# Patient Record
Sex: Male | Born: 1940 | Race: White | Hispanic: No | Smoking: Former smoker
Health system: Southern US, Community
[De-identification: ages and names within clinical notes are randomized; demographics above are authoritative.]

## PROBLEM LIST (undated history)

## (undated) DIAGNOSIS — M109 Gout, unspecified: Secondary | ICD-10-CM

## (undated) DIAGNOSIS — I10 Essential (primary) hypertension: Secondary | ICD-10-CM

## (undated) DIAGNOSIS — E119 Type 2 diabetes mellitus without complications: Secondary | ICD-10-CM

---

## 2014-06-10 DIAGNOSIS — M109 Gout, unspecified: Secondary | ICD-10-CM | POA: Diagnosis not present

## 2014-06-10 DIAGNOSIS — Z1389 Encounter for screening for other disorder: Secondary | ICD-10-CM | POA: Diagnosis not present

## 2014-06-10 DIAGNOSIS — I10 Essential (primary) hypertension: Secondary | ICD-10-CM | POA: Diagnosis not present

## 2014-07-09 DIAGNOSIS — E119 Type 2 diabetes mellitus without complications: Secondary | ICD-10-CM | POA: Diagnosis not present

## 2014-07-09 DIAGNOSIS — I10 Essential (primary) hypertension: Secondary | ICD-10-CM | POA: Diagnosis not present

## 2014-07-09 DIAGNOSIS — M109 Gout, unspecified: Secondary | ICD-10-CM | POA: Diagnosis not present

## 2014-09-26 DIAGNOSIS — M109 Gout, unspecified: Secondary | ICD-10-CM | POA: Diagnosis not present

## 2014-09-26 DIAGNOSIS — E119 Type 2 diabetes mellitus without complications: Secondary | ICD-10-CM | POA: Diagnosis not present

## 2014-09-26 DIAGNOSIS — I10 Essential (primary) hypertension: Secondary | ICD-10-CM | POA: Diagnosis not present

## 2014-10-10 DIAGNOSIS — E119 Type 2 diabetes mellitus without complications: Secondary | ICD-10-CM | POA: Diagnosis not present

## 2014-10-10 DIAGNOSIS — I10 Essential (primary) hypertension: Secondary | ICD-10-CM | POA: Diagnosis not present

## 2014-10-10 DIAGNOSIS — M109 Gout, unspecified: Secondary | ICD-10-CM | POA: Diagnosis not present

## 2015-02-03 DIAGNOSIS — M109 Gout, unspecified: Secondary | ICD-10-CM | POA: Diagnosis not present

## 2015-02-03 DIAGNOSIS — I10 Essential (primary) hypertension: Secondary | ICD-10-CM | POA: Diagnosis not present

## 2015-02-03 DIAGNOSIS — E119 Type 2 diabetes mellitus without complications: Secondary | ICD-10-CM | POA: Diagnosis not present

## 2015-02-03 DIAGNOSIS — H579 Unspecified disorder of eye and adnexa: Secondary | ICD-10-CM

## 2015-02-03 DIAGNOSIS — K029 Dental caries, unspecified: Secondary | ICD-10-CM

## 2015-02-03 NOTE — Congregational Nurse Program (Signed)
Client referred to local [facility] for vision referral. Will place on list for next Dental Bus at the [facility] in [place].

## 2015-02-07 DIAGNOSIS — Z23 Encounter for immunization: Secondary | ICD-10-CM | POA: Diagnosis not present

## 2015-02-07 DIAGNOSIS — M109 Gout, unspecified: Secondary | ICD-10-CM | POA: Diagnosis not present

## 2015-02-07 DIAGNOSIS — E119 Type 2 diabetes mellitus without complications: Secondary | ICD-10-CM | POA: Diagnosis not present

## 2015-02-07 DIAGNOSIS — I10 Essential (primary) hypertension: Secondary | ICD-10-CM | POA: Diagnosis not present

## 2015-05-30 DIAGNOSIS — I10 Essential (primary) hypertension: Secondary | ICD-10-CM | POA: Diagnosis not present

## 2015-05-30 DIAGNOSIS — M109 Gout, unspecified: Secondary | ICD-10-CM | POA: Diagnosis not present

## 2015-05-30 DIAGNOSIS — E119 Type 2 diabetes mellitus without complications: Secondary | ICD-10-CM | POA: Diagnosis not present

## 2015-06-06 DIAGNOSIS — I10 Essential (primary) hypertension: Secondary | ICD-10-CM | POA: Diagnosis not present

## 2015-06-06 DIAGNOSIS — M109 Gout, unspecified: Secondary | ICD-10-CM | POA: Diagnosis not present

## 2015-06-06 DIAGNOSIS — E119 Type 2 diabetes mellitus without complications: Secondary | ICD-10-CM | POA: Diagnosis not present

## 2015-10-14 DIAGNOSIS — E119 Type 2 diabetes mellitus without complications: Secondary | ICD-10-CM | POA: Diagnosis not present

## 2015-10-14 DIAGNOSIS — I10 Essential (primary) hypertension: Secondary | ICD-10-CM | POA: Diagnosis not present

## 2015-10-16 DIAGNOSIS — Z6826 Body mass index (BMI) 26.0-26.9, adult: Secondary | ICD-10-CM | POA: Diagnosis not present

## 2015-10-16 DIAGNOSIS — I10 Essential (primary) hypertension: Secondary | ICD-10-CM | POA: Diagnosis not present

## 2015-10-16 DIAGNOSIS — R944 Abnormal results of kidney function studies: Secondary | ICD-10-CM | POA: Diagnosis not present

## 2015-10-16 DIAGNOSIS — E119 Type 2 diabetes mellitus without complications: Secondary | ICD-10-CM | POA: Diagnosis not present

## 2015-10-16 DIAGNOSIS — M109 Gout, unspecified: Secondary | ICD-10-CM | POA: Diagnosis not present

## 2016-03-05 DIAGNOSIS — M109 Gout, unspecified: Secondary | ICD-10-CM | POA: Diagnosis not present

## 2016-03-05 DIAGNOSIS — R944 Abnormal results of kidney function studies: Secondary | ICD-10-CM | POA: Diagnosis not present

## 2016-03-05 DIAGNOSIS — I10 Essential (primary) hypertension: Secondary | ICD-10-CM | POA: Diagnosis not present

## 2016-03-05 DIAGNOSIS — E119 Type 2 diabetes mellitus without complications: Secondary | ICD-10-CM | POA: Diagnosis not present

## 2016-03-09 DIAGNOSIS — M109 Gout, unspecified: Secondary | ICD-10-CM | POA: Diagnosis not present

## 2016-03-09 DIAGNOSIS — R944 Abnormal results of kidney function studies: Secondary | ICD-10-CM | POA: Diagnosis not present

## 2016-03-09 DIAGNOSIS — E119 Type 2 diabetes mellitus without complications: Secondary | ICD-10-CM | POA: Diagnosis not present

## 2016-03-09 DIAGNOSIS — I10 Essential (primary) hypertension: Secondary | ICD-10-CM | POA: Diagnosis not present

## 2016-03-09 DIAGNOSIS — Z6827 Body mass index (BMI) 27.0-27.9, adult: Secondary | ICD-10-CM | POA: Diagnosis not present

## 2016-07-06 DIAGNOSIS — R944 Abnormal results of kidney function studies: Secondary | ICD-10-CM | POA: Diagnosis not present

## 2016-07-06 DIAGNOSIS — I10 Essential (primary) hypertension: Secondary | ICD-10-CM | POA: Diagnosis not present

## 2016-07-06 DIAGNOSIS — M109 Gout, unspecified: Secondary | ICD-10-CM | POA: Diagnosis not present

## 2016-07-06 DIAGNOSIS — E119 Type 2 diabetes mellitus without complications: Secondary | ICD-10-CM | POA: Diagnosis not present

## 2016-07-09 DIAGNOSIS — R944 Abnormal results of kidney function studies: Secondary | ICD-10-CM | POA: Diagnosis not present

## 2016-07-09 DIAGNOSIS — Z6827 Body mass index (BMI) 27.0-27.9, adult: Secondary | ICD-10-CM | POA: Diagnosis not present

## 2016-07-09 DIAGNOSIS — I10 Essential (primary) hypertension: Secondary | ICD-10-CM | POA: Diagnosis not present

## 2016-07-09 DIAGNOSIS — M109 Gout, unspecified: Secondary | ICD-10-CM | POA: Diagnosis not present

## 2016-07-09 DIAGNOSIS — E119 Type 2 diabetes mellitus without complications: Secondary | ICD-10-CM | POA: Diagnosis not present

## 2016-07-27 DIAGNOSIS — D649 Anemia, unspecified: Secondary | ICD-10-CM | POA: Diagnosis not present

## 2016-07-27 DIAGNOSIS — C3491 Malignant neoplasm of unspecified part of right bronchus or lung: Secondary | ICD-10-CM | POA: Diagnosis not present

## 2016-07-27 DIAGNOSIS — J189 Pneumonia, unspecified organism: Secondary | ICD-10-CM | POA: Diagnosis not present

## 2016-07-27 DIAGNOSIS — M109 Gout, unspecified: Secondary | ICD-10-CM | POA: Diagnosis not present

## 2016-07-27 DIAGNOSIS — R042 Hemoptysis: Secondary | ICD-10-CM | POA: Diagnosis not present

## 2016-07-27 DIAGNOSIS — R079 Chest pain, unspecified: Secondary | ICD-10-CM | POA: Diagnosis not present

## 2016-07-27 DIAGNOSIS — Z79899 Other long term (current) drug therapy: Secondary | ICD-10-CM | POA: Diagnosis not present

## 2016-07-27 DIAGNOSIS — R918 Other nonspecific abnormal finding of lung field: Secondary | ICD-10-CM | POA: Diagnosis not present

## 2016-07-27 DIAGNOSIS — Z87891 Personal history of nicotine dependence: Secondary | ICD-10-CM | POA: Diagnosis not present

## 2016-07-27 DIAGNOSIS — J181 Lobar pneumonia, unspecified organism: Secondary | ICD-10-CM | POA: Diagnosis not present

## 2016-07-27 DIAGNOSIS — I10 Essential (primary) hypertension: Secondary | ICD-10-CM | POA: Diagnosis not present

## 2016-07-30 ENCOUNTER — Other Ambulatory Visit: Payer: Self-pay

## 2016-07-30 NOTE — Patient Outreach (Signed)
[facility]) Care Management  07/30/2016  Nabor Thomann Sep 18, 1940 [id number]     Transition of Care Referral  Referral Date: 07/30/16 Referral Source: [employer] Discharge Report Date of Discharge: 07/29/16 Facility: [place]: [place] attempt # 1 to patient. No answer and unable to leave voicemail message.    Plan: RN CM will make outreach attempt to patient within one business day.  Enzo Montgomery, RN,BSN,CCM [place] Management Telephonic Care Management Coordinator Direct Phone: [phone] Toll Free: [phone] Fax: [phone]

## 2016-08-02 ENCOUNTER — Other Ambulatory Visit: Payer: Self-pay

## 2016-08-02 NOTE — Patient Outreach (Signed)
[facility]) Care Management  08/02/2016  Terry White May 30, 1940 [id number]   Transition of Care Referral  Referral Date: 07/30/16 Referral Source: [employer] Discharge Report Date of Discharge: 07/29/16 Facility: [place] attempt #2 to patient. Recording stating "subscriber unavailable at this time." No alternate numbers to attempt at this time.     Plan: RN CM will make outreach attempt to patient within one business day.   Enzo Montgomery, RN,BSN,CCM [place] Management Telephonic Care Management Coordinator Direct Phone: [phone] Toll Free: [phone] Fax: [phone]

## 2016-08-03 ENCOUNTER — Other Ambulatory Visit: Payer: Self-pay

## 2016-08-03 NOTE — Patient Outreach (Signed)
[facility]) Care Management  08/03/2016  Loukas Antonson 06-25-1940 [id number]   Transition of Care Referral  Referral Date: 07/30/16 Referral Source: [employer] Discharge Report Date of Discharge: 07/29/16 Facility: [place] attempt #3 to patient and no answer. No alternate numbers to attempt.     Plan: RN CM will send unsuccessful outreach letter to patient and close case if no response from patient within 10 business days.    Enzo Montgomery, RN,BSN,CCM [place] Management Telephonic Care Management Coordinator Direct Phone: [phone] Toll Free: [phone] Fax: [phone]

## 2016-08-04 ENCOUNTER — Encounter: Payer: Self-pay | Admitting: Pulmonary Disease

## 2016-08-04 ENCOUNTER — Ambulatory Visit (INDEPENDENT_AMBULATORY_CARE_PROVIDER_SITE_OTHER): Payer: Medicare HMO | Admitting: Pulmonary Disease

## 2016-08-04 VITALS — BP 128/64 | HR 86 | Ht 69.0 in | Wt 166.6 lb

## 2016-08-04 DIAGNOSIS — R918 Other nonspecific abnormal finding of lung field: Secondary | ICD-10-CM

## 2016-08-04 NOTE — Progress Notes (Addendum)
Terry White    [id number]    [birth year]  Primary Care Physician:BOYD, Grace Bushy, PA  Referring Physician: No referring provider defined for this encounter.  Chief complaint:  Consult for evaluation of lung mass  HPI: 76 year old with more than 60-pack-year smoking history, quit in 2008. He was admitted at [facility] rocking him from 4/24/184 hemoptysis, cough with green sputum, 15 pound unintentional weight loss in 2 months, leukocytosis. CT scan noted with large right-sided mass, infiltrates and mediastinal lymphadenopathy. He was treated with antibiotics, cefotetan and Zithromax for postobstructive pneumonia. Interventional radiology was consulted to do a CT-guided biopsy.but it could not be done. He has been discharged with plan for follow-up at pulmonary clinic for further evaluation  Patient is here in clinic today with his son and daughter. He still reports occasional cough with hemoptysis. Denies any sputum production, fevers, chills. The family was under the impression that he is due to get the CT-guided biopsy today and are rather upset that he is here in the clinic instead. They are anxious about the lung mass and the possibility of malignancy.  Outpatient Encounter Prescriptions as of 08/04/2016  Medication Sig  . allopurinol (ZYLOPRIM) 300 MG tablet Take 300 mg by mouth daily.  . Multiple Vitamin (MULTIVITAMIN) tablet Take 1 tablet by mouth daily.  Marland Kitchen azithromycin (ZITHROMAX) 500 MG tablet Take 500 mg by mouth daily.  . cefUROXime (CEFTIN) 500 MG tablet Take 500 mg by mouth 2 (two) times daily.   No facility-administered encounter medications on file as of 08/04/2016.     Allergies as of 08/04/2016  . (Not on File)    No past medical history on file.  No past surgical history on file.  No family history on file.  Social History   Social History  . Marital status: Divorced    Spouse name: N/A  . Number of children: N/A  . Years of education: N/A    Occupational History  . Not on file.   Social History Main Topics  . Smoking status: Not on file  . Smokeless tobacco: Not on file  . Alcohol use Not on file  . Drug use: Unknown  . Sexual activity: Not on file   Other Topics Concern  . Not on file   Social History Narrative  . No narrative on file    Review of systems: Review of Systems  Constitutional: Negative for fever and chills.  HENT: Negative.   Eyes: Negative for blurred vision.  Respiratory: as per HPI  Cardiovascular: Negative for chest pain and palpitations.  Gastrointestinal: Negative for vomiting, diarrhea, blood per rectum. Genitourinary: Negative for dysuria, urgency, frequency and hematuria.  Musculoskeletal: Negative for myalgias, back pain and joint pain.  Skin: Negative for itching and rash.  Neurological: Negative for dizziness, tremors, focal weakness, seizures and loss of consciousness.  Endo/Heme/Allergies: Negative for environmental allergies.  Psychiatric/Behavioral: Negative for depression, suicidal ideas and hallucinations.  All other systems reviewed and are negative.  Physical Exam: Blood pressure 128/64, pulse 86, weight 166 lb 9.6 oz (75.6 kg), SpO2 94 %. Gen:      No acute distress HEENT:  EOMI, sclera anicteric Neck:     No masses; no thyromegaly Lungs:    Clear to auscultation bilaterally; normal respiratory effort CV:         Regular rate and rhythm; no murmurs Abd:      + bowel sounds; soft, non-tender; no palpable masses, no distension Ext:  No edema; adequate peripheral perfusion Skin:      Warm and dry; no rash Neuro: alert and oriented x 3 Psych: normal mood and affect  Data Reviewed: CT angio chest 07/27/16- No significant pulmonary embolism. Large 6.2 x 4.1 x 5.9 cm mass in the right lung base. Underlying patchy opacities concerning for superimposed pneumonia. Enlarged subcarinal, right paratracheal and right hilar nodes Mild bilateral emphysema and chronic scarring at  the right upper lobe, coronary artery calcification, tarry hiatal hernia, renal cysts, humeral head cyst. I have reviewed all the images personally.  Labs 07/27/16 - d-dimer 6.5 WBC 13.6, hemoglobin 10.5, platelets 441 Blood cultures-no growth BUN/creatinine-33, 0.99  Assessment:  Eval for Lung mass Highly suspicious for lung malignancy with mediastinal adenopathy. Given the location of the mass next to the pleura the best approach would be a CT-guided biopsy. If unable to do then we can consider EBUS for sampling of mediastinal lymph nodes. Family is anxious to get a diagnosis and we will try to expedite this as soon as possible  Hemoptysis, cough Finished treatment for postobstructive pneumonia. I'll give Tessalon Perles for cough suppression.  Plan/Recommendations: - Eval for CT guided biopsy - Tessalon perles  More then 1/2 the time of the 40 min visit was spent in counseling and/or coordination of care with the patient and family.  Marshell Garfinkel MD  Pulmonary and Critical Care Pager [phone] 08/04/2016, 11:13 AM  CC: No ref. provider found

## 2016-08-04 NOTE — Patient Instructions (Addendum)
We apologize about the confusion about your clinic appointment today I have reviewed your CT scan. I will discuss with interventional radiology if they can do a CT-guided biopsy We will give him prescription for Tessalon Perles to suppress cough. We will get back in touch soon thanks

## 2016-08-10 ENCOUNTER — Other Ambulatory Visit: Payer: Self-pay | Admitting: Radiology

## 2016-08-11 ENCOUNTER — Ambulatory Visit (HOSPITAL_COMMUNITY)
Admission: RE | Admit: 2016-08-11 | Discharge: 2016-08-11 | Disposition: A | Discharge status: Home / Self Care | Payer: Medicare HMO | Source: Ambulatory Visit | Attending: Pulmonary Disease | Admitting: Pulmonary Disease

## 2016-08-11 ENCOUNTER — Encounter (HOSPITAL_COMMUNITY): Payer: Self-pay

## 2016-08-11 DIAGNOSIS — R918 Other nonspecific abnormal finding of lung field: Secondary | ICD-10-CM | POA: Diagnosis not present

## 2016-08-11 DIAGNOSIS — Z5320 Procedure and treatment not carried out because of patient's decision for unspecified reasons: Secondary | ICD-10-CM | POA: Insufficient documentation

## 2016-08-11 DIAGNOSIS — R911 Solitary pulmonary nodule: Secondary | ICD-10-CM | POA: Diagnosis not present

## 2016-08-11 HISTORY — DX: Gout, unspecified: M10.9

## 2016-08-11 HISTORY — DX: Essential (primary) hypertension: I10

## 2016-08-11 LAB — CBC
HCT: 35.2 % — ABNORMAL LOW (ref 39.0–52.0)
Hemoglobin: 10.8 g/dL — ABNORMAL LOW (ref 13.0–17.0)
MCH: 29.2 pg (ref 26.0–34.0)
MCHC: 30.7 g/dL (ref 30.0–36.0)
MCV: 95.1 fL (ref 78.0–100.0)
PLATELETS: 416 10*3/uL — AB (ref 150–400)
RBC: 3.7 MIL/uL — AB (ref 4.22–5.81)
RDW: 17.4 % — AB (ref 11.5–15.5)
WBC: 10.4 10*3/uL (ref 4.0–10.5)

## 2016-08-11 LAB — APTT: APTT: 52 s — AB (ref 24–36)

## 2016-08-11 LAB — PROTIME-INR
INR: 1.1
PROTHROMBIN TIME: 14.3 s (ref 11.4–15.2)

## 2016-08-11 MED ORDER — SODIUM CHLORIDE 0.9 % IV SOLN: INTRAVENOUS | Status: DC

## 2016-08-11 NOTE — Sedation Documentation (Signed)
Doctor spoke with patient and family at bedside. Patient decided not to have procedure.

## 2016-08-11 NOTE — Sedation Documentation (Signed)
Patient ambulatory with cane and family members. Nurse walked patient out of department without incident.

## 2016-08-11 NOTE — H&P (Signed)
Chief Complaint: RML lung mass  Referring Physician:Dr. Marshell Garfinkel  Supervising Physician: Sandi Mariscal  Patient Status: [facility] - Out-pt  HPI: Terry White is a 76 y.o. male who was admitted to [facility] in April for a PNA.  He was admitted secondary to hemoptysis.  This has since resolved.  He is still taking his abx and finishes his course tomorrow.  He denies CP/SOB/fevers/chills.  During his admission, he had imaging that revealed a RML lung mass.  He also noted an unintentional weight loss of 15lbs.  After discharge he was sent to see pulmonary.  He saw Dr. Vaughan Browner and a request was made for a lung biopsy as the suspicion of this lesion was high for malignancy.  The patient presents today for this procedure.    Past Medical History: RML lung mass  Past Surgical History: History reviewed. No pertinent surgical history.  Family History: History reviewed. No pertinent family history.  Social History:  reports that he quit smoking about 10 years ago. He has never used smokeless tobacco. His alcohol and drug histories are not on file.  Allergies: No Known Allergies  Medications: Medications reviewed in epic  Please HPI for pertinent positives, otherwise complete 10 system ROS negative.  Mallampati Score: MD Evaluation Airway: WNL Heart: WNL Abdomen: WNL Chest/ Lungs: WNL ASA  Classification: 2 Mallampati/Airway Score: One  Physical Exam: BP 138/79   Pulse 86   Temp 97.6 F (36.4 C) (Oral)   Resp 18   SpO2 100%  There is no height or weight on file to calculate BMI. General: pleasant, WD, WN white male who is laying in bed in NAD HEENT: head is normocephalic, atraumatic.  Sclera are noninjected.  PERRL.  Ears and nose without any masses or lesions.  Mouth is pink and moist Heart: regular, rate, and rhythm.  Normal s1,s2. No obvious murmurs, gallops, or rubs noted.  Palpable radial and pedal pulses bilaterally Lungs: CTAB, no wheezes, rhonchi, or rales noted.   Respiratory effort nonlabored Abd: soft, NT, ND, +BS, no masses, hernias, or organomegaly Psych: A&Ox3 with an appropriate affect.   Labs: Results for orders placed or performed during the hospital encounter of 08/11/16 (from the past 48 hour(s))  CBC upon arrival     Status: Abnormal   Collection Time: 08/11/16  9:14 AM  Result Value Ref Range   WBC 10.4 4.0 - 10.5 K/uL   RBC 3.70 (L) 4.22 - 5.81 MIL/uL   Hemoglobin 10.8 (L) 13.0 - 17.0 g/dL   HCT 35.2 (L) 39.0 - 52.0 %   MCV 95.1 78.0 - 100.0 fL   MCH 29.2 26.0 - 34.0 pg   MCHC 30.7 30.0 - 36.0 g/dL   RDW 17.4 (H) 11.5 - 15.5 %   Platelets 416 (H) 150 - 400 K/uL    Imaging: No results found.  Assessment/Plan 1. RML lung mass We will plan to proceed today with a lung biopsy.  His labs and vitals have been reviewed.   Risks and Benefits discussed with the patient including, but not limited to bleeding, hemoptysis, respiratory failure requiring intubation, infection, pneumothorax requiring chest tube placement, stroke from air embolism or even death. All of the patient's questions were answered, patient is agreeable to proceed. Consent signed and in chart.   Thank you for this interesting consult.  I greatly enjoyed meeting Abdoulaye Drum and look forward to participating in their care.  A copy of this report was sent to the requesting provider on this  date.  Electronically Signed: Alyene Predmore E 08/11/2016, 10:52 AM   I spent a total of  30 Minutes   in face to face in clinical consultation, greater than 50% of which was counseling/coordinating care for RML lung mass

## 2016-08-13 ENCOUNTER — Telehealth: Payer: Self-pay | Admitting: Pulmonary Disease

## 2016-08-13 DIAGNOSIS — R918 Other nonspecific abnormal finding of lung field: Secondary | ICD-10-CM

## 2016-08-13 NOTE — Telephone Encounter (Signed)
Scheduled the patient for his PET scan on 10/07/16 arrive at 10:30am NPO after midnight this is at [facility] go to the main entrance and check in.

## 2016-08-13 NOTE — Telephone Encounter (Signed)
Pt is aware of date/time of scheduled PET. First available with PM after pt's scan is 7/17, there is two held spots at 3:30 and 3:45.  PM please advise if okay to use a held spot. Thanks.

## 2016-08-13 NOTE — Telephone Encounter (Signed)
PET scan has been order for early July.  I have attempted to call pt to scheduled a f/u with PM after PET. Pt ask that I call back in 48mn as he is currently in the grocery store. WCB.

## 2016-08-13 NOTE — Telephone Encounter (Signed)
I called and spoke with the patient about the recent IR eval. Pt declined to go ahead with the lung biopsy after discussion with Dr. Pascal Lux as he is afraid of possible complications. Pt wants to get a PET scan done in 3 months.  I recommended a PET scan sooner than 3 months as the suspicion for malignancy is high. However he is insistent that we wait as he wants "3 months to live". He would reconsider a biopsy if the PET is positive.   Please order PET scan in early July and follow up in clinic with me after to discuss.  Marshell Garfinkel MD [place] Pulmonary and Critical [facility] Pager [phone] If no answer or after 3pm call: [phone] 08/13/2016, 12:57 PM

## 2016-08-18 ENCOUNTER — Other Ambulatory Visit: Payer: Self-pay

## 2016-08-18 NOTE — Telephone Encounter (Signed)
Spoke with pt, who states he rescheduled his PET scan for 11/29/16. Pt has been scheduled for 12/02/16 @ 2:30 with PM to review results. Nothing further needed.  Will route to PM as a FYI.

## 2016-08-18 NOTE — Patient Outreach (Signed)
[name]) Care Management  08/18/2016  Terry White Jun 19, 1940 [id number]   Transition of Care Referral  Referral Date: 07/30/16 Referral Source: [employer] Discharge Report Date of Discharge: 07/29/16 Facility: [place]    Multiple attempts to establish contact with patient without success. No response from letter mailed to patient. Case is being closed at this time.     Plan: RN CM will notify [facility] administrative assistant of case closure.

## 2016-08-18 NOTE — Telephone Encounter (Signed)
Yes. It is fine to use a held spot. Thanks  PM

## 2016-08-19 DIAGNOSIS — Z6825 Body mass index (BMI) 25.0-25.9, adult: Secondary | ICD-10-CM | POA: Diagnosis not present

## 2016-08-19 DIAGNOSIS — R918 Other nonspecific abnormal finding of lung field: Secondary | ICD-10-CM | POA: Diagnosis not present

## 2016-08-19 DIAGNOSIS — M109 Gout, unspecified: Secondary | ICD-10-CM | POA: Diagnosis not present

## 2016-08-19 DIAGNOSIS — E119 Type 2 diabetes mellitus without complications: Secondary | ICD-10-CM | POA: Diagnosis not present

## 2016-08-19 DIAGNOSIS — J189 Pneumonia, unspecified organism: Secondary | ICD-10-CM | POA: Diagnosis not present

## 2016-08-19 DIAGNOSIS — I10 Essential (primary) hypertension: Secondary | ICD-10-CM | POA: Diagnosis not present

## 2016-10-07 ENCOUNTER — Ambulatory Visit (HOSPITAL_COMMUNITY): Payer: Medicare HMO

## 2016-11-23 DIAGNOSIS — W182XXA Fall in (into) shower or empty bathtub, initial encounter: Secondary | ICD-10-CM | POA: Diagnosis not present

## 2016-11-23 DIAGNOSIS — S2231XA Fracture of one rib, right side, initial encounter for closed fracture: Secondary | ICD-10-CM | POA: Diagnosis not present

## 2016-11-23 DIAGNOSIS — I10 Essential (primary) hypertension: Secondary | ICD-10-CM | POA: Diagnosis not present

## 2016-11-23 DIAGNOSIS — M109 Gout, unspecified: Secondary | ICD-10-CM | POA: Diagnosis not present

## 2016-11-23 DIAGNOSIS — Z87891 Personal history of nicotine dependence: Secondary | ICD-10-CM | POA: Diagnosis not present

## 2016-11-23 DIAGNOSIS — Z8673 Personal history of transient ischemic attack (TIA), and cerebral infarction without residual deficits: Secondary | ICD-10-CM | POA: Diagnosis not present

## 2016-11-23 DIAGNOSIS — Z79899 Other long term (current) drug therapy: Secondary | ICD-10-CM | POA: Diagnosis not present

## 2016-11-24 DIAGNOSIS — S2231XA Fracture of one rib, right side, initial encounter for closed fracture: Secondary | ICD-10-CM | POA: Diagnosis not present

## 2016-11-29 ENCOUNTER — Ambulatory Visit (HOSPITAL_COMMUNITY): Payer: Medicare HMO

## 2016-11-30 DIAGNOSIS — S2231XA Fracture of one rib, right side, initial encounter for closed fracture: Secondary | ICD-10-CM | POA: Diagnosis not present

## 2016-11-30 DIAGNOSIS — Z6827 Body mass index (BMI) 27.0-27.9, adult: Secondary | ICD-10-CM | POA: Diagnosis not present

## 2016-12-02 ENCOUNTER — Ambulatory Visit: Payer: Medicare HMO | Admitting: Pulmonary Disease

## 2016-12-14 ENCOUNTER — Ambulatory Visit (HOSPITAL_COMMUNITY)
Admission: RE | Admit: 2016-12-14 | Discharge: 2016-12-14 | Disposition: A | Discharge status: Home / Self Care | Payer: Medicare HMO | Source: Ambulatory Visit | Attending: Pulmonary Disease | Admitting: Pulmonary Disease

## 2016-12-14 DIAGNOSIS — S2241XA Multiple fractures of ribs, right side, initial encounter for closed fracture: Secondary | ICD-10-CM | POA: Diagnosis not present

## 2016-12-14 DIAGNOSIS — R918 Other nonspecific abnormal finding of lung field: Secondary | ICD-10-CM | POA: Diagnosis not present

## 2016-12-14 DIAGNOSIS — X58XXXA Exposure to other specified factors, initial encounter: Secondary | ICD-10-CM | POA: Insufficient documentation

## 2016-12-14 LAB — GLUCOSE, CAPILLARY: GLUCOSE-CAPILLARY: 136 mg/dL — AB (ref 65–99)

## 2016-12-14 MED ORDER — FLUDEOXYGLUCOSE F - 18 (FDG) INJECTION
8.1700 | Freq: Once | INTRAVENOUS | Status: AC | PRN
  Administered 2016-12-14: 8.17 via INTRAVENOUS

## 2017-01-03 ENCOUNTER — Encounter: Payer: Self-pay | Admitting: Pulmonary Disease

## 2017-01-03 ENCOUNTER — Ambulatory Visit (INDEPENDENT_AMBULATORY_CARE_PROVIDER_SITE_OTHER): Payer: Medicare HMO | Admitting: Pulmonary Disease

## 2017-01-03 DIAGNOSIS — R0602 Shortness of breath: Secondary | ICD-10-CM | POA: Diagnosis not present

## 2017-01-03 NOTE — Patient Instructions (Signed)
I had reviewed your most recent PET scan. It's good news that the mass in the right lung has resolved. In retrospect this looks like it may have had a pneumonia There is some lung scarring and changes consistent with emphysema There are no suspicious findings We'll get a follow-up CT in a year's time Follow up in clinic after CT.

## 2017-01-03 NOTE — Progress Notes (Addendum)
Terry White    [id number]    10-05-1940  Primary Care Physician:Boyd, Grace Bushy, PA  Referring Physician: Lavella Lemons, PA [place], [place] [address]  Chief complaint:  Follow up for evaluation of lung mass  HPI: 76 year old with more than 60-pack-year smoking history, quit in 2008. He was admitted at [facility] rocking him from 4/24/184 hemoptysis, cough with green sputum, 15 pound unintentional weight loss in 2 months, leukocytosis. CT scan noted with large right-sided mass, infiltrates and mediastinal lymphadenopathy. He was treated with antibiotics, cefotetan and Zithromax for postobstructive pneumonia. Interventional radiology was consulted to do a CT-guided biopsy.but it could not be done. He has been discharged with plan for follow-up at pulmonary clinic for further evaluation  Interim History: He was scheduled for a CT-guided biopsy but canceled due to concern about complications He continues to do well. He had a follow-up PET scan last month and is here to discuss results.  Outpatient Encounter Prescriptions as of 01/03/2017  Medication Sig  . allopurinol (ZYLOPRIM) 300 MG tablet Take 300 mg by mouth every evening.   . IRON PO Take 1 tablet by mouth every evening.  Marland Kitchen lisinopril (PRINIVIL,ZESTRIL) 40 MG tablet Take 40 mg by mouth every evening.  . Omega-3 Fatty Acids (FISH OIL PO) Take 1 capsule by mouth every evening.  . [DISCONTINUED] cefUROXime (CEFTIN) 500 MG tablet Take 500 mg by mouth 2 (two) times daily.   No facility-administered encounter medications on file as of 01/03/2017.     Allergies as of 01/03/2017  . (No Known Allergies)    Past Medical History:  Diagnosis Date  . Gout   . HTN (hypertension)     No past surgical history on file.  No family history on file.  Social History   Social History  . Marital status: Divorced    Spouse name: N/A  . Number of children: N/A  . Years of education: N/A   Occupational History  . Not  on file.   Social History Main Topics  . Smoking status: Former Smoker    Quit date: 04/05/2006  . Smokeless tobacco: Never Used  . Alcohol use No  . Drug use: No  . Sexual activity: Not on file   Other Topics Concern  . Not on file   Social History Narrative  . No narrative on file    Review of systems: Review of Systems  Constitutional: Negative for fever and chills.  HENT: Negative.   Eyes: Negative for blurred vision.  Respiratory: as per HPI  Cardiovascular: Negative for chest pain and palpitations.  Gastrointestinal: Negative for vomiting, diarrhea, blood per rectum. Genitourinary: Negative for dysuria, urgency, frequency and hematuria.  Musculoskeletal: Negative for myalgias, back pain and joint pain.  Skin: Negative for itching and rash.  Neurological: Negative for dizziness, tremors, focal weakness, seizures and loss of consciousness.  Endo/Heme/Allergies: Negative for environmental allergies.  Psychiatric/Behavioral: Negative for depression, suicidal ideas and hallucinations.  All other systems reviewed and are negative.  Physical Exam: Blood pressure 136/78, pulse 91, height 5' 9.5" (1.765 m), weight 79 kg (174 lb 4 oz), SpO2 96 %. Gen:      No acute distress HEENT:  EOMI, sclera anicteric Neck:     No masses; no thyromegaly Lungs:    Clear to auscultation bilaterally; normal respiratory effort CV:         Regular rate and rhythm; no murmurs Abd:      + bowel  sounds; soft, non-tender; no palpable masses, no distension Ext:    No edema; adequate peripheral perfusion Skin:      Warm and dry; no rash Neuro: alert and oriented x 3 Psych: normal mood and affect  Data Reviewed: CT angio chest 07/27/16- No significant pulmonary embolism. Large 6.2 x 4.1 x 5.9 cm mass in the right lung base. Underlying patchy opacities concerning for superimposed pneumonia. Enlarged subcarinal, right paratracheal and right hilar nodes Mild bilateral emphysema and chronic scarring at  the right upper lobe, coronary artery calcification, tarry hiatal hernia, renal cysts, humeral head cyst.  PET scan 12/14/16 Resolution of right lower lobe mass. There is mediastinal and hilar lymphadenopathy that is stable Apical pulmonary scarring with emphysematous changes. Multiple rib fractures. No FDG uptake I have reviewed all the images personally.  Labs 07/27/16 - d-dimer 6.5 WBC 13.6, hemoglobin 10.5, platelets 441 Blood cultures-no growth BUN/creatinine-33, 0.99  Assessment:  Eval for Lung mass I reviewed his images including the most recent PET scan. It shows resolution of the right lower lobe mass which in restrospect appears to have been a pneumonia in the setting of fall and rib fracture. He has some apical scarring with nodular opacities and mediastinal lymphadenopathy which are likely benign. We will follow this without repeat CT in 1 year's time.  Emphysema His CT scan shows emphysematous changes. However he is asymptomatic and does not require any inhalers.  Plan/Recommendations: - CT without contrast in 1 year  More then 1/2 the time of the 40 min visit was spent in counseling and/or coordination of care with the patient and family.  Marshell Garfinkel MD [place] Pulmonary and Critical Care Pager [phone] 01/03/2017, 11:38 AM  CC: Lavella Lemons, PA

## 2017-01-10 DIAGNOSIS — I10 Essential (primary) hypertension: Secondary | ICD-10-CM | POA: Diagnosis not present

## 2017-01-10 DIAGNOSIS — M109 Gout, unspecified: Secondary | ICD-10-CM | POA: Diagnosis not present

## 2017-01-10 DIAGNOSIS — E119 Type 2 diabetes mellitus without complications: Secondary | ICD-10-CM | POA: Diagnosis not present

## 2017-01-10 DIAGNOSIS — R944 Abnormal results of kidney function studies: Secondary | ICD-10-CM | POA: Diagnosis not present

## 2017-01-12 DIAGNOSIS — Z6827 Body mass index (BMI) 27.0-27.9, adult: Secondary | ICD-10-CM | POA: Diagnosis not present

## 2017-01-12 DIAGNOSIS — E119 Type 2 diabetes mellitus without complications: Secondary | ICD-10-CM | POA: Diagnosis not present

## 2017-01-12 DIAGNOSIS — Z23 Encounter for immunization: Secondary | ICD-10-CM | POA: Diagnosis not present

## 2017-01-12 DIAGNOSIS — M109 Gout, unspecified: Secondary | ICD-10-CM | POA: Diagnosis not present

## 2017-01-12 DIAGNOSIS — R944 Abnormal results of kidney function studies: Secondary | ICD-10-CM | POA: Diagnosis not present

## 2017-01-12 DIAGNOSIS — I10 Essential (primary) hypertension: Secondary | ICD-10-CM | POA: Diagnosis not present

## 2017-05-10 DIAGNOSIS — I10 Essential (primary) hypertension: Secondary | ICD-10-CM | POA: Diagnosis not present

## 2017-05-10 DIAGNOSIS — R944 Abnormal results of kidney function studies: Secondary | ICD-10-CM | POA: Diagnosis not present

## 2017-05-10 DIAGNOSIS — R918 Other nonspecific abnormal finding of lung field: Secondary | ICD-10-CM | POA: Diagnosis not present

## 2017-05-10 DIAGNOSIS — E119 Type 2 diabetes mellitus without complications: Secondary | ICD-10-CM | POA: Diagnosis not present

## 2017-05-12 DIAGNOSIS — M109 Gout, unspecified: Secondary | ICD-10-CM | POA: Diagnosis not present

## 2017-05-12 DIAGNOSIS — I10 Essential (primary) hypertension: Secondary | ICD-10-CM | POA: Diagnosis not present

## 2017-05-12 DIAGNOSIS — Z6827 Body mass index (BMI) 27.0-27.9, adult: Secondary | ICD-10-CM | POA: Diagnosis not present

## 2017-05-12 DIAGNOSIS — E119 Type 2 diabetes mellitus without complications: Secondary | ICD-10-CM | POA: Diagnosis not present

## 2017-05-12 DIAGNOSIS — R944 Abnormal results of kidney function studies: Secondary | ICD-10-CM | POA: Diagnosis not present

## 2017-09-06 DIAGNOSIS — R918 Other nonspecific abnormal finding of lung field: Secondary | ICD-10-CM | POA: Diagnosis not present

## 2017-09-06 DIAGNOSIS — I10 Essential (primary) hypertension: Secondary | ICD-10-CM | POA: Diagnosis not present

## 2017-09-06 DIAGNOSIS — E119 Type 2 diabetes mellitus without complications: Secondary | ICD-10-CM | POA: Diagnosis not present

## 2017-09-06 DIAGNOSIS — R944 Abnormal results of kidney function studies: Secondary | ICD-10-CM | POA: Diagnosis not present

## 2017-09-08 DIAGNOSIS — I10 Essential (primary) hypertension: Secondary | ICD-10-CM | POA: Diagnosis not present

## 2017-09-08 DIAGNOSIS — R944 Abnormal results of kidney function studies: Secondary | ICD-10-CM | POA: Diagnosis not present

## 2017-09-08 DIAGNOSIS — Z6827 Body mass index (BMI) 27.0-27.9, adult: Secondary | ICD-10-CM | POA: Diagnosis not present

## 2017-09-08 DIAGNOSIS — K219 Gastro-esophageal reflux disease without esophagitis: Secondary | ICD-10-CM | POA: Diagnosis not present

## 2017-09-08 DIAGNOSIS — M109 Gout, unspecified: Secondary | ICD-10-CM | POA: Diagnosis not present

## 2017-09-08 DIAGNOSIS — E119 Type 2 diabetes mellitus without complications: Secondary | ICD-10-CM | POA: Diagnosis not present

## 2017-10-04 DIAGNOSIS — R944 Abnormal results of kidney function studies: Secondary | ICD-10-CM | POA: Diagnosis not present

## 2017-10-04 DIAGNOSIS — E119 Type 2 diabetes mellitus without complications: Secondary | ICD-10-CM | POA: Diagnosis not present

## 2017-10-04 DIAGNOSIS — I10 Essential (primary) hypertension: Secondary | ICD-10-CM | POA: Diagnosis not present

## 2018-01-02 DIAGNOSIS — R944 Abnormal results of kidney function studies: Secondary | ICD-10-CM | POA: Diagnosis not present

## 2018-01-02 DIAGNOSIS — I10 Essential (primary) hypertension: Secondary | ICD-10-CM | POA: Diagnosis not present

## 2018-01-02 DIAGNOSIS — E119 Type 2 diabetes mellitus without complications: Secondary | ICD-10-CM | POA: Diagnosis not present

## 2018-01-02 DIAGNOSIS — K219 Gastro-esophageal reflux disease without esophagitis: Secondary | ICD-10-CM | POA: Diagnosis not present

## 2018-01-05 DIAGNOSIS — K219 Gastro-esophageal reflux disease without esophagitis: Secondary | ICD-10-CM | POA: Diagnosis not present

## 2018-01-05 DIAGNOSIS — Z6824 Body mass index (BMI) 24.0-24.9, adult: Secondary | ICD-10-CM | POA: Diagnosis not present

## 2018-01-05 DIAGNOSIS — R945 Abnormal results of liver function studies: Secondary | ICD-10-CM | POA: Diagnosis not present

## 2018-01-05 DIAGNOSIS — M109 Gout, unspecified: Secondary | ICD-10-CM | POA: Diagnosis not present

## 2018-01-05 DIAGNOSIS — Z23 Encounter for immunization: Secondary | ICD-10-CM | POA: Diagnosis not present

## 2018-01-05 DIAGNOSIS — R944 Abnormal results of kidney function studies: Secondary | ICD-10-CM | POA: Diagnosis not present

## 2018-01-05 DIAGNOSIS — E119 Type 2 diabetes mellitus without complications: Secondary | ICD-10-CM | POA: Diagnosis not present

## 2018-01-05 DIAGNOSIS — I10 Essential (primary) hypertension: Secondary | ICD-10-CM | POA: Diagnosis not present

## 2018-01-10 DIAGNOSIS — N281 Cyst of kidney, acquired: Secondary | ICD-10-CM | POA: Diagnosis not present

## 2018-01-10 DIAGNOSIS — K8020 Calculus of gallbladder without cholecystitis without obstruction: Secondary | ICD-10-CM | POA: Diagnosis not present

## 2018-01-10 DIAGNOSIS — R945 Abnormal results of liver function studies: Secondary | ICD-10-CM | POA: Diagnosis not present

## 2018-01-10 DIAGNOSIS — I700 Atherosclerosis of aorta: Secondary | ICD-10-CM | POA: Diagnosis not present

## 2018-01-11 ENCOUNTER — Ambulatory Visit (HOSPITAL_COMMUNITY)
Admission: RE | Admit: 2018-01-11 | Discharge: 2018-01-11 | Disposition: A | Discharge status: Home / Self Care | Payer: Medicare HMO | Source: Ambulatory Visit | Attending: Pulmonary Disease | Admitting: Pulmonary Disease

## 2018-01-11 DIAGNOSIS — D3502 Benign neoplasm of left adrenal gland: Secondary | ICD-10-CM | POA: Insufficient documentation

## 2018-01-11 DIAGNOSIS — J432 Centrilobular emphysema: Secondary | ICD-10-CM | POA: Diagnosis not present

## 2018-01-11 DIAGNOSIS — R918 Other nonspecific abnormal finding of lung field: Secondary | ICD-10-CM | POA: Insufficient documentation

## 2018-01-11 DIAGNOSIS — R0602 Shortness of breath: Secondary | ICD-10-CM | POA: Diagnosis not present

## 2018-01-11 DIAGNOSIS — I700 Atherosclerosis of aorta: Secondary | ICD-10-CM | POA: Diagnosis not present

## 2018-01-11 DIAGNOSIS — I2510 Atherosclerotic heart disease of native coronary artery without angina pectoris: Secondary | ICD-10-CM | POA: Insufficient documentation

## 2018-01-11 DIAGNOSIS — J439 Emphysema, unspecified: Secondary | ICD-10-CM | POA: Diagnosis not present

## 2018-01-11 DIAGNOSIS — J438 Other emphysema: Secondary | ICD-10-CM | POA: Insufficient documentation

## 2018-02-28 DIAGNOSIS — M79642 Pain in left hand: Secondary | ICD-10-CM | POA: Diagnosis not present

## 2018-02-28 DIAGNOSIS — Z6824 Body mass index (BMI) 24.0-24.9, adult: Secondary | ICD-10-CM | POA: Diagnosis not present

## 2018-03-16 DIAGNOSIS — Z6823 Body mass index (BMI) 23.0-23.9, adult: Secondary | ICD-10-CM | POA: Diagnosis not present

## 2018-03-16 DIAGNOSIS — M79642 Pain in left hand: Secondary | ICD-10-CM | POA: Diagnosis not present

## 2018-04-03 DIAGNOSIS — I10 Essential (primary) hypertension: Secondary | ICD-10-CM | POA: Diagnosis not present

## 2018-04-03 DIAGNOSIS — G5622 Lesion of ulnar nerve, left upper limb: Secondary | ICD-10-CM | POA: Diagnosis not present

## 2018-04-03 DIAGNOSIS — G5621 Lesion of ulnar nerve, right upper limb: Secondary | ICD-10-CM | POA: Diagnosis not present

## 2018-04-03 DIAGNOSIS — G5602 Carpal tunnel syndrome, left upper limb: Secondary | ICD-10-CM | POA: Diagnosis not present

## 2018-05-05 DIAGNOSIS — K219 Gastro-esophageal reflux disease without esophagitis: Secondary | ICD-10-CM | POA: Diagnosis not present

## 2018-05-05 DIAGNOSIS — R945 Abnormal results of liver function studies: Secondary | ICD-10-CM | POA: Diagnosis not present

## 2018-05-05 DIAGNOSIS — D509 Iron deficiency anemia, unspecified: Secondary | ICD-10-CM | POA: Diagnosis not present

## 2018-05-05 DIAGNOSIS — E119 Type 2 diabetes mellitus without complications: Secondary | ICD-10-CM | POA: Diagnosis not present

## 2018-05-05 DIAGNOSIS — I10 Essential (primary) hypertension: Secondary | ICD-10-CM | POA: Diagnosis not present

## 2018-05-05 DIAGNOSIS — D519 Vitamin B12 deficiency anemia, unspecified: Secondary | ICD-10-CM | POA: Diagnosis not present

## 2018-05-05 DIAGNOSIS — D529 Folate deficiency anemia, unspecified: Secondary | ICD-10-CM | POA: Diagnosis not present

## 2018-05-05 DIAGNOSIS — R944 Abnormal results of kidney function studies: Secondary | ICD-10-CM | POA: Diagnosis not present

## 2018-05-08 DIAGNOSIS — D649 Anemia, unspecified: Secondary | ICD-10-CM | POA: Diagnosis not present

## 2018-05-08 DIAGNOSIS — E119 Type 2 diabetes mellitus without complications: Secondary | ICD-10-CM | POA: Diagnosis not present

## 2018-05-08 DIAGNOSIS — K219 Gastro-esophageal reflux disease without esophagitis: Secondary | ICD-10-CM | POA: Diagnosis not present

## 2018-05-08 DIAGNOSIS — M109 Gout, unspecified: Secondary | ICD-10-CM | POA: Diagnosis not present

## 2018-05-08 DIAGNOSIS — I10 Essential (primary) hypertension: Secondary | ICD-10-CM | POA: Diagnosis not present

## 2018-05-18 DIAGNOSIS — G5601 Carpal tunnel syndrome, right upper limb: Secondary | ICD-10-CM | POA: Diagnosis not present

## 2018-05-18 DIAGNOSIS — G5602 Carpal tunnel syndrome, left upper limb: Secondary | ICD-10-CM | POA: Diagnosis not present

## 2018-05-31 DIAGNOSIS — R944 Abnormal results of kidney function studies: Secondary | ICD-10-CM | POA: Diagnosis not present

## 2018-05-31 DIAGNOSIS — K219 Gastro-esophageal reflux disease without esophagitis: Secondary | ICD-10-CM | POA: Diagnosis not present

## 2018-05-31 DIAGNOSIS — R945 Abnormal results of liver function studies: Secondary | ICD-10-CM | POA: Diagnosis not present

## 2018-05-31 DIAGNOSIS — I10 Essential (primary) hypertension: Secondary | ICD-10-CM | POA: Diagnosis not present

## 2018-05-31 DIAGNOSIS — E119 Type 2 diabetes mellitus without complications: Secondary | ICD-10-CM | POA: Diagnosis not present

## 2018-06-08 DIAGNOSIS — M109 Gout, unspecified: Secondary | ICD-10-CM | POA: Diagnosis not present

## 2018-06-08 DIAGNOSIS — I10 Essential (primary) hypertension: Secondary | ICD-10-CM | POA: Diagnosis not present

## 2018-06-08 DIAGNOSIS — E119 Type 2 diabetes mellitus without complications: Secondary | ICD-10-CM | POA: Diagnosis not present

## 2018-06-08 DIAGNOSIS — D649 Anemia, unspecified: Secondary | ICD-10-CM | POA: Diagnosis not present

## 2018-06-08 DIAGNOSIS — K219 Gastro-esophageal reflux disease without esophagitis: Secondary | ICD-10-CM | POA: Diagnosis not present

## 2018-06-09 DIAGNOSIS — G5602 Carpal tunnel syndrome, left upper limb: Secondary | ICD-10-CM | POA: Diagnosis not present

## 2018-10-04 DIAGNOSIS — E119 Type 2 diabetes mellitus without complications: Secondary | ICD-10-CM | POA: Diagnosis not present

## 2018-10-04 DIAGNOSIS — K219 Gastro-esophageal reflux disease without esophagitis: Secondary | ICD-10-CM | POA: Diagnosis not present

## 2018-10-04 DIAGNOSIS — I10 Essential (primary) hypertension: Secondary | ICD-10-CM | POA: Diagnosis not present

## 2018-10-04 DIAGNOSIS — R945 Abnormal results of liver function studies: Secondary | ICD-10-CM | POA: Diagnosis not present

## 2018-10-04 DIAGNOSIS — R944 Abnormal results of kidney function studies: Secondary | ICD-10-CM | POA: Diagnosis not present

## 2018-10-06 DIAGNOSIS — E119 Type 2 diabetes mellitus without complications: Secondary | ICD-10-CM | POA: Diagnosis not present

## 2018-10-06 DIAGNOSIS — M109 Gout, unspecified: Secondary | ICD-10-CM | POA: Diagnosis not present

## 2018-10-06 DIAGNOSIS — Z6824 Body mass index (BMI) 24.0-24.9, adult: Secondary | ICD-10-CM | POA: Diagnosis not present

## 2018-10-06 DIAGNOSIS — K219 Gastro-esophageal reflux disease without esophagitis: Secondary | ICD-10-CM | POA: Diagnosis not present

## 2018-10-06 DIAGNOSIS — I10 Essential (primary) hypertension: Secondary | ICD-10-CM | POA: Diagnosis not present

## 2018-10-06 DIAGNOSIS — Z0000 Encounter for general adult medical examination without abnormal findings: Secondary | ICD-10-CM | POA: Diagnosis not present

## 2018-10-10 DIAGNOSIS — E875 Hyperkalemia: Secondary | ICD-10-CM | POA: Diagnosis not present

## 2018-10-17 DIAGNOSIS — R945 Abnormal results of liver function studies: Secondary | ICD-10-CM | POA: Diagnosis not present

## 2018-10-17 DIAGNOSIS — E875 Hyperkalemia: Secondary | ICD-10-CM | POA: Diagnosis not present

## 2018-10-17 DIAGNOSIS — R944 Abnormal results of kidney function studies: Secondary | ICD-10-CM | POA: Diagnosis not present

## 2019-01-23 ENCOUNTER — Other Ambulatory Visit: Payer: Self-pay

## 2019-01-23 NOTE — Patient Outreach (Signed)
[facility]) Care Management  01/23/2019  Terry White [birth year] [id number]   Medication Adherence call to [name] Compliant Voice message left with a call back number. [name] is showing past due on Lisinopril 40 mg under [place].   [place] Management Direct Dial [phone]  Fax [phone] [name]

## 2019-02-27 DIAGNOSIS — M109 Gout, unspecified: Secondary | ICD-10-CM | POA: Diagnosis not present

## 2019-02-27 DIAGNOSIS — D649 Anemia, unspecified: Secondary | ICD-10-CM | POA: Diagnosis not present

## 2019-02-27 DIAGNOSIS — I10 Essential (primary) hypertension: Secondary | ICD-10-CM | POA: Diagnosis not present

## 2019-02-27 DIAGNOSIS — E119 Type 2 diabetes mellitus without complications: Secondary | ICD-10-CM | POA: Diagnosis not present

## 2019-02-27 DIAGNOSIS — Z23 Encounter for immunization: Secondary | ICD-10-CM | POA: Diagnosis not present

## 2019-02-27 DIAGNOSIS — K219 Gastro-esophageal reflux disease without esophagitis: Secondary | ICD-10-CM | POA: Diagnosis not present

## 2019-02-27 DIAGNOSIS — Z6824 Body mass index (BMI) 24.0-24.9, adult: Secondary | ICD-10-CM | POA: Diagnosis not present

## 2019-06-21 DIAGNOSIS — I10 Essential (primary) hypertension: Secondary | ICD-10-CM | POA: Diagnosis not present

## 2019-06-21 DIAGNOSIS — R944 Abnormal results of kidney function studies: Secondary | ICD-10-CM | POA: Diagnosis not present

## 2019-06-21 DIAGNOSIS — K219 Gastro-esophageal reflux disease without esophagitis: Secondary | ICD-10-CM | POA: Diagnosis not present

## 2019-06-21 DIAGNOSIS — R945 Abnormal results of liver function studies: Secondary | ICD-10-CM | POA: Diagnosis not present

## 2019-06-21 DIAGNOSIS — E119 Type 2 diabetes mellitus without complications: Secondary | ICD-10-CM | POA: Diagnosis not present

## 2019-06-26 DIAGNOSIS — Z13220 Encounter for screening for lipoid disorders: Secondary | ICD-10-CM | POA: Diagnosis not present

## 2019-06-26 DIAGNOSIS — E119 Type 2 diabetes mellitus without complications: Secondary | ICD-10-CM | POA: Diagnosis not present

## 2019-06-26 DIAGNOSIS — I10 Essential (primary) hypertension: Secondary | ICD-10-CM | POA: Diagnosis not present

## 2019-06-26 DIAGNOSIS — K219 Gastro-esophageal reflux disease without esophagitis: Secondary | ICD-10-CM | POA: Diagnosis not present

## 2019-06-28 DIAGNOSIS — M109 Gout, unspecified: Secondary | ICD-10-CM | POA: Diagnosis not present

## 2019-06-28 DIAGNOSIS — I10 Essential (primary) hypertension: Secondary | ICD-10-CM | POA: Diagnosis not present

## 2019-06-28 DIAGNOSIS — K219 Gastro-esophageal reflux disease without esophagitis: Secondary | ICD-10-CM | POA: Diagnosis not present

## 2019-06-28 DIAGNOSIS — D649 Anemia, unspecified: Secondary | ICD-10-CM | POA: Diagnosis not present

## 2019-06-28 DIAGNOSIS — E119 Type 2 diabetes mellitus without complications: Secondary | ICD-10-CM | POA: Diagnosis not present

## 2019-10-31 DIAGNOSIS — I10 Essential (primary) hypertension: Secondary | ICD-10-CM | POA: Diagnosis not present

## 2019-10-31 DIAGNOSIS — Z23 Encounter for immunization: Secondary | ICD-10-CM | POA: Diagnosis not present

## 2019-10-31 DIAGNOSIS — Z0000 Encounter for general adult medical examination without abnormal findings: Secondary | ICD-10-CM | POA: Diagnosis not present

## 2019-10-31 DIAGNOSIS — E119 Type 2 diabetes mellitus without complications: Secondary | ICD-10-CM | POA: Diagnosis not present

## 2019-10-31 DIAGNOSIS — K219 Gastro-esophageal reflux disease without esophagitis: Secondary | ICD-10-CM | POA: Diagnosis not present

## 2019-10-31 DIAGNOSIS — D649 Anemia, unspecified: Secondary | ICD-10-CM | POA: Diagnosis not present

## 2019-10-31 DIAGNOSIS — M109 Gout, unspecified: Secondary | ICD-10-CM | POA: Diagnosis not present

## 2019-11-05 DIAGNOSIS — E875 Hyperkalemia: Secondary | ICD-10-CM | POA: Diagnosis not present

## 2019-11-05 DIAGNOSIS — I10 Essential (primary) hypertension: Secondary | ICD-10-CM | POA: Diagnosis not present

## 2019-11-14 DIAGNOSIS — R945 Abnormal results of liver function studies: Secondary | ICD-10-CM | POA: Diagnosis not present

## 2019-11-14 DIAGNOSIS — R944 Abnormal results of kidney function studies: Secondary | ICD-10-CM | POA: Diagnosis not present

## 2020-01-04 IMAGING — CT CT CHEST W/O CM
2 of 3 series · 15 of 36 positions shown, 18 images · non-contrast
Comparison: PET 12/14/2016 and CT chest 07/27/2016.

CLINICAL DATA: Shortness of breath.  Emphysema.

EXAM:
CT CHEST WITHOUT CONTRAST
TECHNIQUE: Multidetector CT imaging of the chest was performed following the
standard protocol without IV contrast.

[Series 2: thorax · axial · 0.62mm/px · z∈[+1288,+1546]mm · 12 of 153 slices shown, 15 images]
[im 12/153  mediastinal]
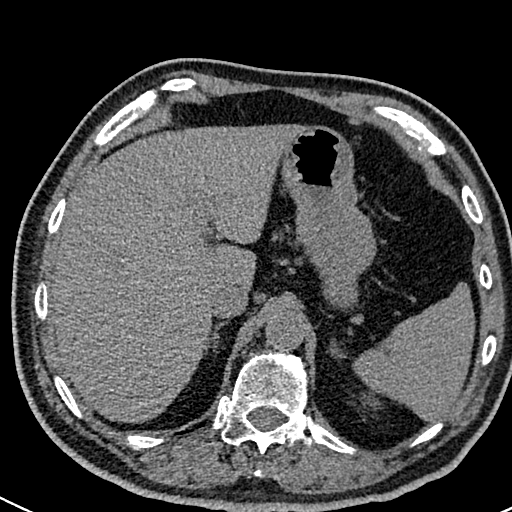
[im 12/153  lung]
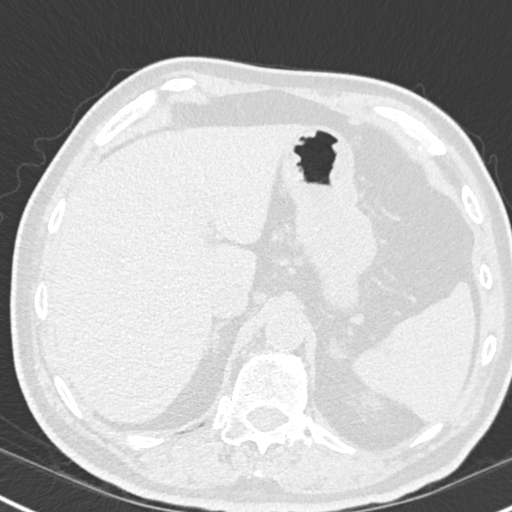
[im 23/153  lung]
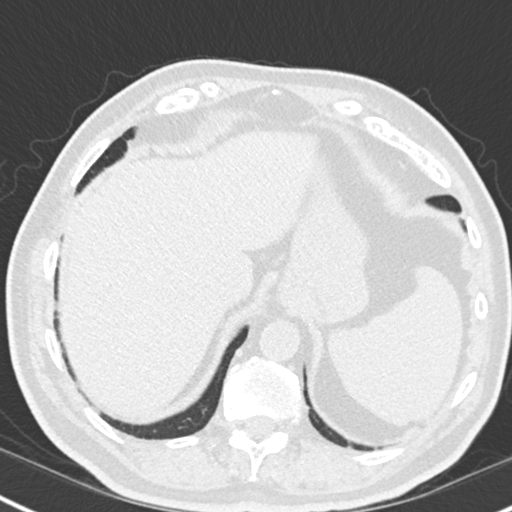
[im 34/153  lung]
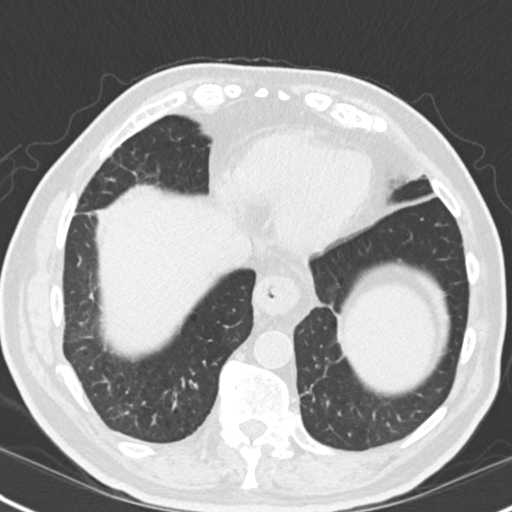
[im 46/153  lung]
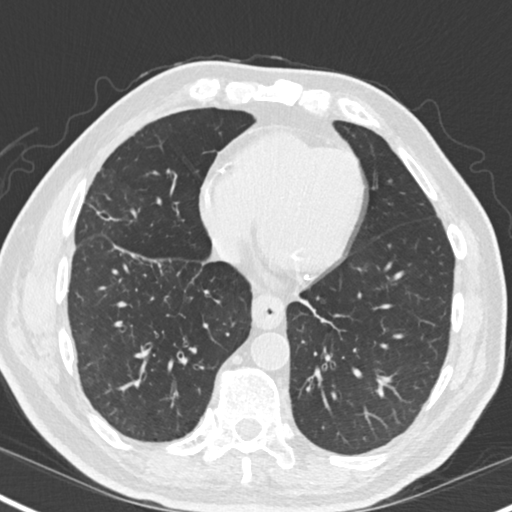
[im 57/153  mediastinal]
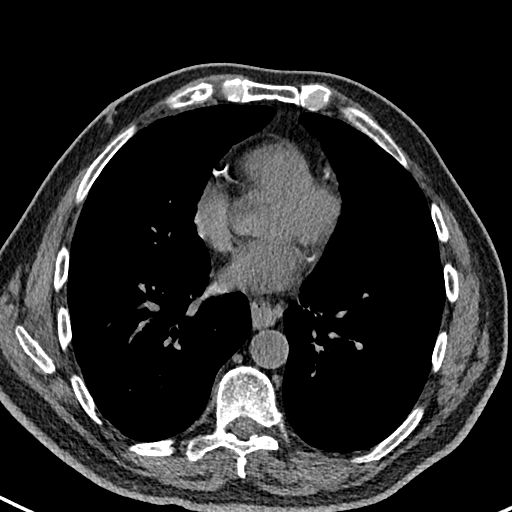
[im 57/153  lung]
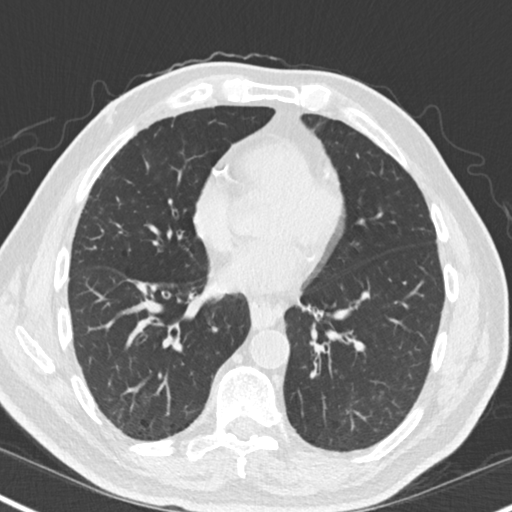
[im 68/153  lung]
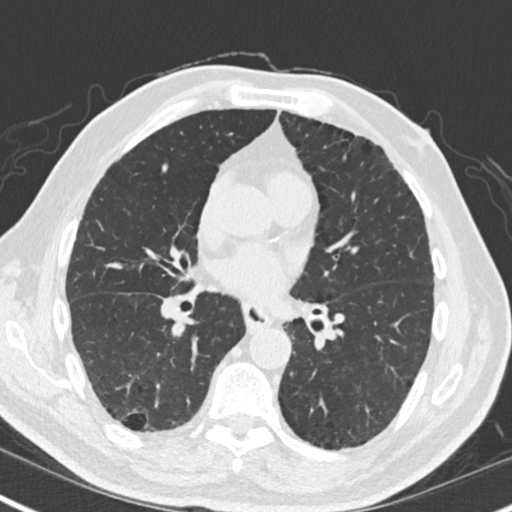
[im 85/153  lung]
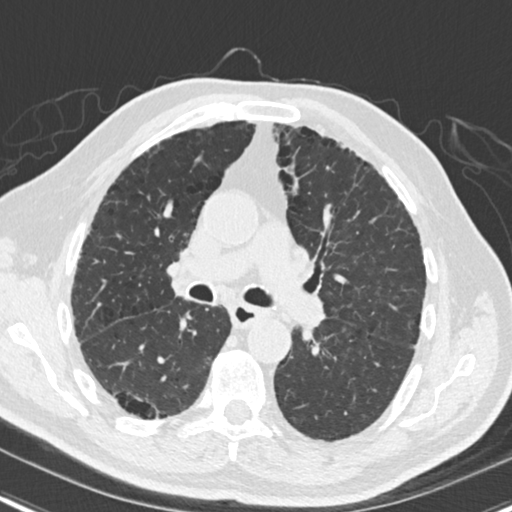
[im 96/153  lung]
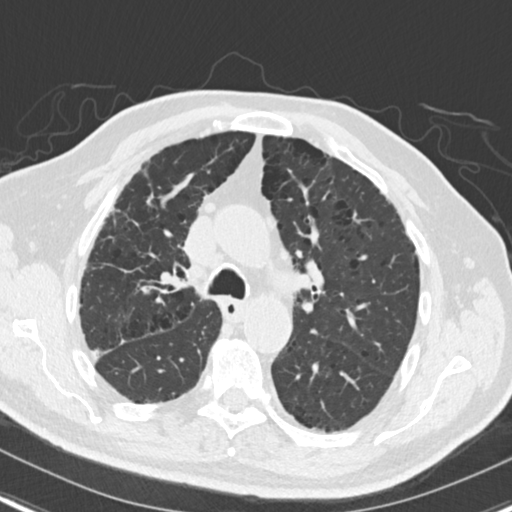
[im 107/153  mediastinal]
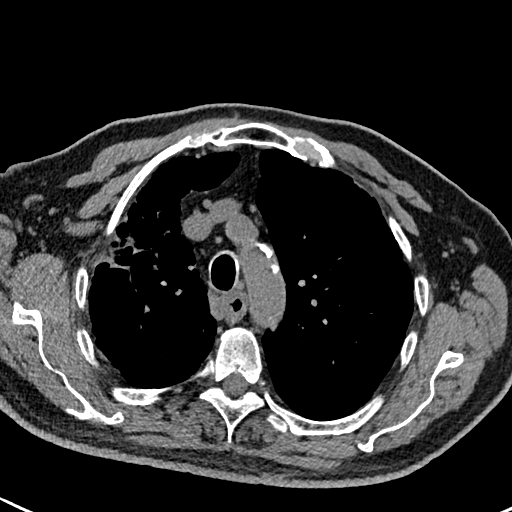
[im 107/153  lung]
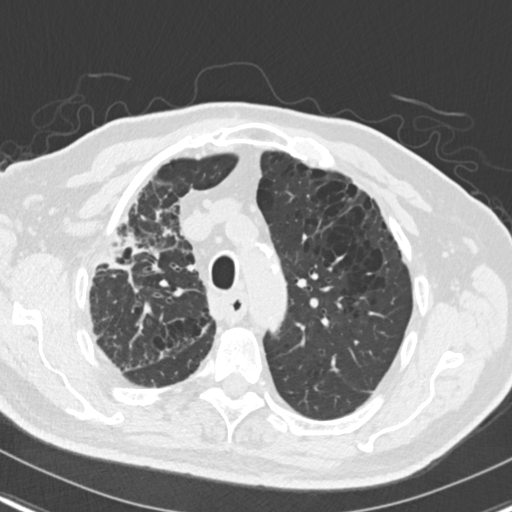
[im 119/153  lung]
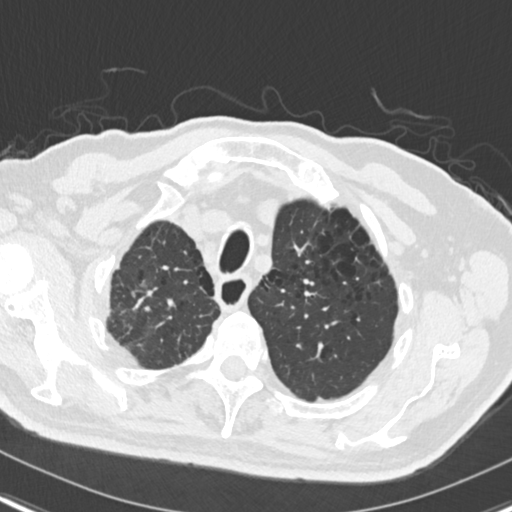
[im 130/153  lung]
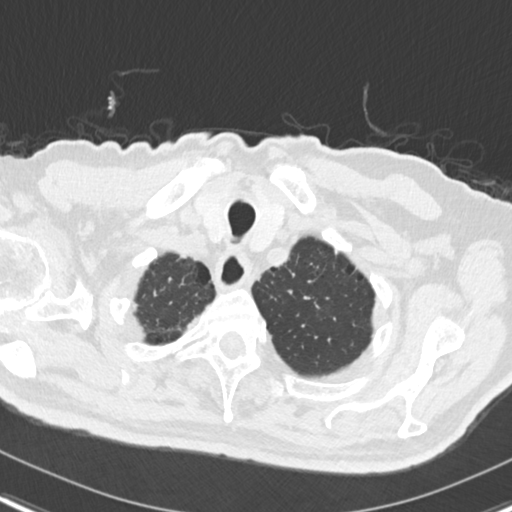
[im 141/153  lung]
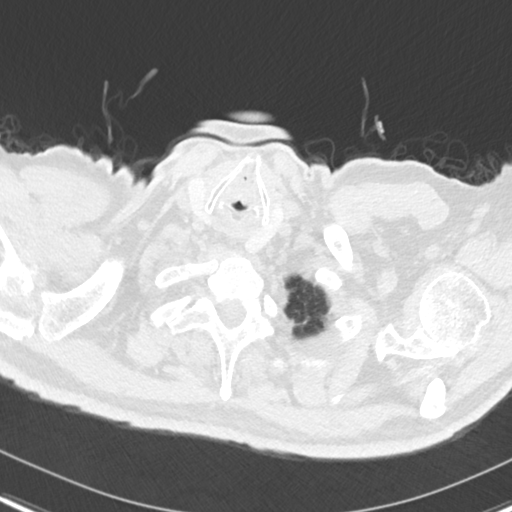

[Series 5: coronal · coronal · 0.61mm/px · 3 of 151 slices shown]
[im 31/151  lung]
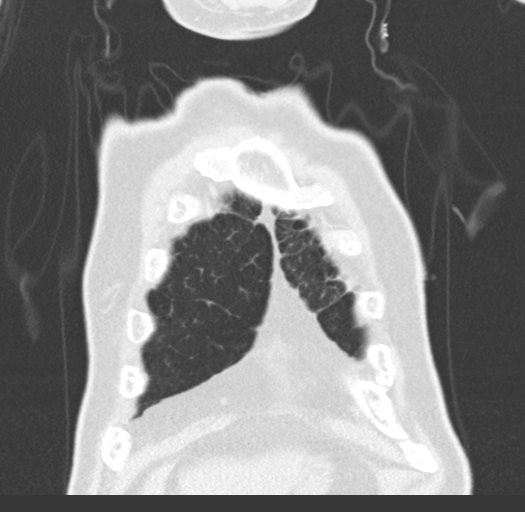
[im 61/151  lung]
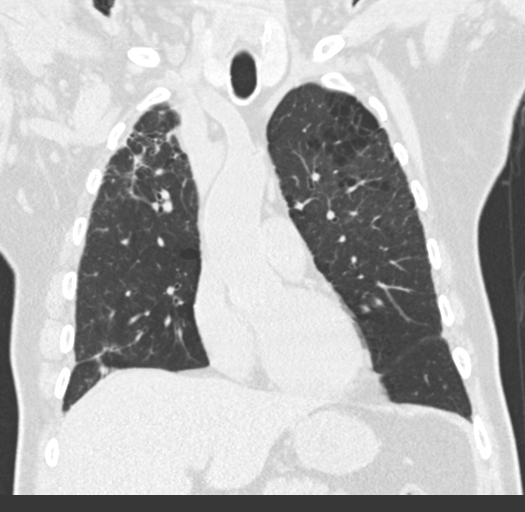
[im 91/151  lung]
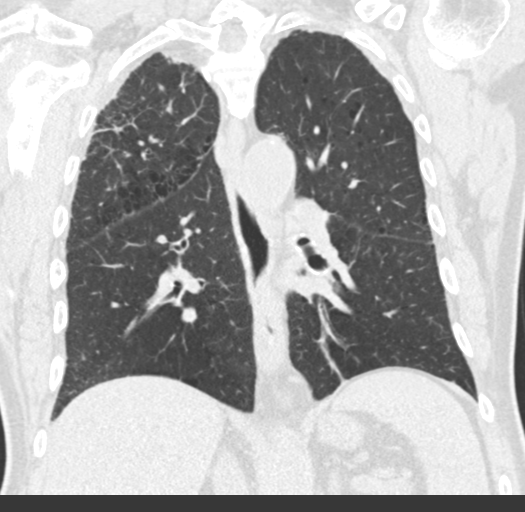

[15 of 36 positions shown; findings below may reference images not displayed]

FINDINGS: Cardiovascular: Atherosclerotic calcification of the arterial
vasculature, including three-vessel involvement of the coronary
arteries. Heart size normal. No pericardial effusion.

Mediastinum/Nodes: Mediastinal lymph nodes are not enlarged by CT
size criteria. Hilar regions are difficult to definitively evaluate
without IV contrast. No axillary adenopathy. There may be distal
esophageal wall thickening, which can be seen with gastroesophageal
reflux. Tiny hiatal hernia.

Lungs/Pleura: Centrilobular and paraseptal emphysema. Chronic
scarring in right upper lobe. Bronchiectasis and volume loss in the
anterior right lower lobe. Mild ill-defined ground-glass
peribronchovascular nodularity in the lingula and left lower lobe.
No acute airspace consolidation. No pleural fluid. Airway is
unremarkable.

Upper Abdomen: Visualized portions of the liver and right adrenal
gland are unremarkable. Fluid density left adrenal nodule measures
11 mm. Low-attenuation lesions in the kidneys measure up to 3.6 cm
on the left, incompletely visualized. Visualized portions of the
spleen, pancreas, stomach and bowel are grossly unremarkable with
exception of a tiny hiatal hernia. Upper abdominal lymph nodes are
not enlarged by CT size criteria.

Musculoskeletal: Degenerative changes in the spine. No worrisome
lytic or sclerotic lesions.
IMPRESSION: 1. Very mild ill-defined peribronchovascular ground-glass nodularity
in the lingula and left lower lobe may represent an infectious
bronchiolitis. Post infectious scarring can also have this
appearance.
2.  Emphysema (IVQ3X-YJM.B).
3. Aortic atherosclerosis (IVQ3X-170.0). Three-vessel coronary
artery calcification.
4. Left adrenal adenoma.

## 2020-03-07 DIAGNOSIS — R944 Abnormal results of kidney function studies: Secondary | ICD-10-CM | POA: Diagnosis not present

## 2020-03-07 DIAGNOSIS — R945 Abnormal results of liver function studies: Secondary | ICD-10-CM | POA: Diagnosis not present

## 2020-03-07 DIAGNOSIS — K219 Gastro-esophageal reflux disease without esophagitis: Secondary | ICD-10-CM | POA: Diagnosis not present

## 2020-03-07 DIAGNOSIS — I10 Essential (primary) hypertension: Secondary | ICD-10-CM | POA: Diagnosis not present

## 2020-03-07 DIAGNOSIS — E119 Type 2 diabetes mellitus without complications: Secondary | ICD-10-CM | POA: Diagnosis not present

## 2020-03-11 DIAGNOSIS — Z23 Encounter for immunization: Secondary | ICD-10-CM | POA: Diagnosis not present

## 2020-03-11 DIAGNOSIS — R944 Abnormal results of kidney function studies: Secondary | ICD-10-CM | POA: Diagnosis not present

## 2020-03-11 DIAGNOSIS — R945 Abnormal results of liver function studies: Secondary | ICD-10-CM | POA: Diagnosis not present

## 2020-03-11 DIAGNOSIS — K219 Gastro-esophageal reflux disease without esophagitis: Secondary | ICD-10-CM | POA: Diagnosis not present

## 2020-03-11 DIAGNOSIS — I10 Essential (primary) hypertension: Secondary | ICD-10-CM | POA: Diagnosis not present

## 2020-03-11 DIAGNOSIS — E119 Type 2 diabetes mellitus without complications: Secondary | ICD-10-CM | POA: Diagnosis not present

## 2020-04-04 DIAGNOSIS — E875 Hyperkalemia: Secondary | ICD-10-CM | POA: Diagnosis not present

## 2020-04-04 DIAGNOSIS — Z7984 Long term (current) use of oral hypoglycemic drugs: Secondary | ICD-10-CM | POA: Diagnosis not present

## 2020-04-04 DIAGNOSIS — E119 Type 2 diabetes mellitus without complications: Secondary | ICD-10-CM | POA: Diagnosis not present

## 2020-04-04 DIAGNOSIS — I10 Essential (primary) hypertension: Secondary | ICD-10-CM | POA: Diagnosis not present

## 2020-05-03 DIAGNOSIS — E119 Type 2 diabetes mellitus without complications: Secondary | ICD-10-CM | POA: Diagnosis not present

## 2020-05-03 DIAGNOSIS — Z7984 Long term (current) use of oral hypoglycemic drugs: Secondary | ICD-10-CM | POA: Diagnosis not present

## 2020-05-03 DIAGNOSIS — E875 Hyperkalemia: Secondary | ICD-10-CM | POA: Diagnosis not present

## 2020-05-03 DIAGNOSIS — I10 Essential (primary) hypertension: Secondary | ICD-10-CM | POA: Diagnosis not present

## 2020-07-10 DIAGNOSIS — M109 Gout, unspecified: Secondary | ICD-10-CM | POA: Diagnosis not present

## 2020-07-10 DIAGNOSIS — K219 Gastro-esophageal reflux disease without esophagitis: Secondary | ICD-10-CM | POA: Diagnosis not present

## 2020-07-10 DIAGNOSIS — R944 Abnormal results of kidney function studies: Secondary | ICD-10-CM | POA: Diagnosis not present

## 2020-07-10 DIAGNOSIS — I10 Essential (primary) hypertension: Secondary | ICD-10-CM | POA: Diagnosis not present

## 2020-07-10 DIAGNOSIS — E119 Type 2 diabetes mellitus without complications: Secondary | ICD-10-CM | POA: Diagnosis not present

## 2020-07-10 DIAGNOSIS — Z6824 Body mass index (BMI) 24.0-24.9, adult: Secondary | ICD-10-CM | POA: Diagnosis not present

## 2020-07-14 DIAGNOSIS — E875 Hyperkalemia: Secondary | ICD-10-CM | POA: Diagnosis not present

## 2020-07-14 DIAGNOSIS — E119 Type 2 diabetes mellitus without complications: Secondary | ICD-10-CM | POA: Diagnosis not present

## 2020-07-14 DIAGNOSIS — I10 Essential (primary) hypertension: Secondary | ICD-10-CM | POA: Diagnosis not present

## 2020-11-11 DIAGNOSIS — Z13220 Encounter for screening for lipoid disorders: Secondary | ICD-10-CM | POA: Diagnosis not present

## 2020-11-11 DIAGNOSIS — R944 Abnormal results of kidney function studies: Secondary | ICD-10-CM | POA: Diagnosis not present

## 2020-11-11 DIAGNOSIS — K219 Gastro-esophageal reflux disease without esophagitis: Secondary | ICD-10-CM | POA: Diagnosis not present

## 2020-11-11 DIAGNOSIS — R7989 Other specified abnormal findings of blood chemistry: Secondary | ICD-10-CM | POA: Diagnosis not present

## 2020-11-11 DIAGNOSIS — E119 Type 2 diabetes mellitus without complications: Secondary | ICD-10-CM | POA: Diagnosis not present

## 2020-11-14 DIAGNOSIS — I10 Essential (primary) hypertension: Secondary | ICD-10-CM | POA: Diagnosis not present

## 2020-11-14 DIAGNOSIS — E119 Type 2 diabetes mellitus without complications: Secondary | ICD-10-CM | POA: Diagnosis not present

## 2020-11-14 DIAGNOSIS — K219 Gastro-esophageal reflux disease without esophagitis: Secondary | ICD-10-CM | POA: Diagnosis not present

## 2020-11-14 DIAGNOSIS — R944 Abnormal results of kidney function studies: Secondary | ICD-10-CM | POA: Diagnosis not present

## 2020-11-14 DIAGNOSIS — M109 Gout, unspecified: Secondary | ICD-10-CM | POA: Diagnosis not present

## 2021-04-10 DIAGNOSIS — E119 Type 2 diabetes mellitus without complications: Secondary | ICD-10-CM | POA: Diagnosis not present

## 2021-04-10 DIAGNOSIS — R944 Abnormal results of kidney function studies: Secondary | ICD-10-CM | POA: Diagnosis not present

## 2021-04-10 DIAGNOSIS — R7989 Other specified abnormal findings of blood chemistry: Secondary | ICD-10-CM | POA: Diagnosis not present

## 2021-04-10 DIAGNOSIS — I10 Essential (primary) hypertension: Secondary | ICD-10-CM | POA: Diagnosis not present

## 2021-04-10 DIAGNOSIS — Z13220 Encounter for screening for lipoid disorders: Secondary | ICD-10-CM | POA: Diagnosis not present

## 2021-04-10 DIAGNOSIS — K219 Gastro-esophageal reflux disease without esophagitis: Secondary | ICD-10-CM | POA: Diagnosis not present

## 2021-04-14 DIAGNOSIS — K219 Gastro-esophageal reflux disease without esophagitis: Secondary | ICD-10-CM | POA: Diagnosis not present

## 2021-04-14 DIAGNOSIS — E119 Type 2 diabetes mellitus without complications: Secondary | ICD-10-CM | POA: Diagnosis not present

## 2021-04-14 DIAGNOSIS — I10 Essential (primary) hypertension: Secondary | ICD-10-CM | POA: Diagnosis not present

## 2021-04-14 DIAGNOSIS — R944 Abnormal results of kidney function studies: Secondary | ICD-10-CM | POA: Diagnosis not present

## 2021-04-14 DIAGNOSIS — M109 Gout, unspecified: Secondary | ICD-10-CM | POA: Diagnosis not present

## 2021-07-01 DIAGNOSIS — H5213 Myopia, bilateral: Secondary | ICD-10-CM | POA: Diagnosis not present

## 2021-07-17 DIAGNOSIS — H25811 Combined forms of age-related cataract, right eye: Secondary | ICD-10-CM | POA: Diagnosis not present

## 2021-07-17 DIAGNOSIS — H25812 Combined forms of age-related cataract, left eye: Secondary | ICD-10-CM | POA: Diagnosis not present

## 2021-08-07 DIAGNOSIS — M109 Gout, unspecified: Secondary | ICD-10-CM | POA: Diagnosis not present

## 2021-08-07 DIAGNOSIS — E119 Type 2 diabetes mellitus without complications: Secondary | ICD-10-CM | POA: Diagnosis not present

## 2021-08-07 DIAGNOSIS — E875 Hyperkalemia: Secondary | ICD-10-CM | POA: Diagnosis not present

## 2021-08-07 DIAGNOSIS — I10 Essential (primary) hypertension: Secondary | ICD-10-CM | POA: Diagnosis not present

## 2021-08-07 DIAGNOSIS — D649 Anemia, unspecified: Secondary | ICD-10-CM | POA: Diagnosis not present

## 2021-08-07 DIAGNOSIS — K219 Gastro-esophageal reflux disease without esophagitis: Secondary | ICD-10-CM | POA: Diagnosis not present

## 2021-08-07 DIAGNOSIS — R945 Abnormal results of liver function studies: Secondary | ICD-10-CM | POA: Diagnosis not present

## 2021-08-11 DIAGNOSIS — I10 Essential (primary) hypertension: Secondary | ICD-10-CM | POA: Diagnosis not present

## 2021-08-13 DIAGNOSIS — M109 Gout, unspecified: Secondary | ICD-10-CM | POA: Diagnosis not present

## 2021-08-13 DIAGNOSIS — I10 Essential (primary) hypertension: Secondary | ICD-10-CM | POA: Diagnosis not present

## 2021-08-13 DIAGNOSIS — K219 Gastro-esophageal reflux disease without esophagitis: Secondary | ICD-10-CM | POA: Diagnosis not present

## 2021-08-13 DIAGNOSIS — Z6823 Body mass index (BMI) 23.0-23.9, adult: Secondary | ICD-10-CM | POA: Diagnosis not present

## 2021-08-13 DIAGNOSIS — E119 Type 2 diabetes mellitus without complications: Secondary | ICD-10-CM | POA: Diagnosis not present

## 2021-08-13 DIAGNOSIS — R944 Abnormal results of kidney function studies: Secondary | ICD-10-CM | POA: Diagnosis not present

## 2021-08-17 DIAGNOSIS — Z0001 Encounter for general adult medical examination with abnormal findings: Secondary | ICD-10-CM | POA: Diagnosis not present

## 2021-08-17 DIAGNOSIS — D649 Anemia, unspecified: Secondary | ICD-10-CM | POA: Diagnosis not present

## 2021-08-17 DIAGNOSIS — I10 Essential (primary) hypertension: Secondary | ICD-10-CM | POA: Diagnosis not present

## 2021-08-17 DIAGNOSIS — E119 Type 2 diabetes mellitus without complications: Secondary | ICD-10-CM | POA: Diagnosis not present

## 2021-08-17 DIAGNOSIS — F17210 Nicotine dependence, cigarettes, uncomplicated: Secondary | ICD-10-CM | POA: Diagnosis not present

## 2021-09-10 DIAGNOSIS — H25812 Combined forms of age-related cataract, left eye: Secondary | ICD-10-CM | POA: Diagnosis not present

## 2021-09-10 DIAGNOSIS — Z01818 Encounter for other preprocedural examination: Secondary | ICD-10-CM | POA: Diagnosis not present

## 2021-09-10 DIAGNOSIS — E119 Type 2 diabetes mellitus without complications: Secondary | ICD-10-CM | POA: Diagnosis not present

## 2021-09-10 DIAGNOSIS — H2512 Age-related nuclear cataract, left eye: Secondary | ICD-10-CM | POA: Diagnosis not present

## 2021-09-10 DIAGNOSIS — H269 Unspecified cataract: Secondary | ICD-10-CM | POA: Diagnosis not present

## 2021-09-22 DIAGNOSIS — E1136 Type 2 diabetes mellitus with diabetic cataract: Secondary | ICD-10-CM | POA: Diagnosis not present

## 2021-10-01 DIAGNOSIS — H269 Unspecified cataract: Secondary | ICD-10-CM | POA: Diagnosis not present

## 2021-10-01 DIAGNOSIS — H25811 Combined forms of age-related cataract, right eye: Secondary | ICD-10-CM | POA: Diagnosis not present

## 2021-11-09 DIAGNOSIS — H524 Presbyopia: Secondary | ICD-10-CM | POA: Diagnosis not present

## 2021-12-04 DIAGNOSIS — E875 Hyperkalemia: Secondary | ICD-10-CM | POA: Diagnosis not present

## 2021-12-04 DIAGNOSIS — K219 Gastro-esophageal reflux disease without esophagitis: Secondary | ICD-10-CM | POA: Diagnosis not present

## 2021-12-04 DIAGNOSIS — Z13220 Encounter for screening for lipoid disorders: Secondary | ICD-10-CM | POA: Diagnosis not present

## 2021-12-04 DIAGNOSIS — I10 Essential (primary) hypertension: Secondary | ICD-10-CM | POA: Diagnosis not present

## 2021-12-04 DIAGNOSIS — E119 Type 2 diabetes mellitus without complications: Secondary | ICD-10-CM | POA: Diagnosis not present

## 2021-12-04 DIAGNOSIS — R7989 Other specified abnormal findings of blood chemistry: Secondary | ICD-10-CM | POA: Diagnosis not present

## 2021-12-10 DIAGNOSIS — Z6823 Body mass index (BMI) 23.0-23.9, adult: Secondary | ICD-10-CM | POA: Diagnosis not present

## 2021-12-10 DIAGNOSIS — R944 Abnormal results of kidney function studies: Secondary | ICD-10-CM | POA: Diagnosis not present

## 2021-12-10 DIAGNOSIS — K219 Gastro-esophageal reflux disease without esophagitis: Secondary | ICD-10-CM | POA: Diagnosis not present

## 2021-12-10 DIAGNOSIS — I10 Essential (primary) hypertension: Secondary | ICD-10-CM | POA: Diagnosis not present

## 2021-12-10 DIAGNOSIS — M109 Gout, unspecified: Secondary | ICD-10-CM | POA: Diagnosis not present

## 2021-12-10 DIAGNOSIS — E119 Type 2 diabetes mellitus without complications: Secondary | ICD-10-CM | POA: Diagnosis not present

## 2022-04-09 DIAGNOSIS — E875 Hyperkalemia: Secondary | ICD-10-CM | POA: Diagnosis not present

## 2022-04-09 DIAGNOSIS — E119 Type 2 diabetes mellitus without complications: Secondary | ICD-10-CM | POA: Diagnosis not present

## 2022-04-09 DIAGNOSIS — D649 Anemia, unspecified: Secondary | ICD-10-CM | POA: Diagnosis not present

## 2022-04-09 DIAGNOSIS — R7989 Other specified abnormal findings of blood chemistry: Secondary | ICD-10-CM | POA: Diagnosis not present

## 2022-04-09 DIAGNOSIS — Z13220 Encounter for screening for lipoid disorders: Secondary | ICD-10-CM | POA: Diagnosis not present

## 2022-04-09 DIAGNOSIS — K219 Gastro-esophageal reflux disease without esophagitis: Secondary | ICD-10-CM | POA: Diagnosis not present

## 2022-04-09 DIAGNOSIS — I10 Essential (primary) hypertension: Secondary | ICD-10-CM | POA: Diagnosis not present

## 2022-04-09 DIAGNOSIS — M109 Gout, unspecified: Secondary | ICD-10-CM | POA: Diagnosis not present

## 2022-04-13 DIAGNOSIS — K219 Gastro-esophageal reflux disease without esophagitis: Secondary | ICD-10-CM | POA: Diagnosis not present

## 2022-04-13 DIAGNOSIS — Z6824 Body mass index (BMI) 24.0-24.9, adult: Secondary | ICD-10-CM | POA: Diagnosis not present

## 2022-04-13 DIAGNOSIS — Z23 Encounter for immunization: Secondary | ICD-10-CM | POA: Diagnosis not present

## 2022-04-13 DIAGNOSIS — R944 Abnormal results of kidney function studies: Secondary | ICD-10-CM | POA: Diagnosis not present

## 2022-04-13 DIAGNOSIS — E119 Type 2 diabetes mellitus without complications: Secondary | ICD-10-CM | POA: Diagnosis not present

## 2022-04-13 DIAGNOSIS — I10 Essential (primary) hypertension: Secondary | ICD-10-CM | POA: Diagnosis not present

## 2022-04-13 DIAGNOSIS — M109 Gout, unspecified: Secondary | ICD-10-CM | POA: Diagnosis not present

## 2022-05-29 DIAGNOSIS — J189 Pneumonia, unspecified organism: Secondary | ICD-10-CM | POA: Diagnosis not present

## 2022-10-08 DIAGNOSIS — Z13220 Encounter for screening for lipoid disorders: Secondary | ICD-10-CM | POA: Diagnosis not present

## 2022-10-08 DIAGNOSIS — R7989 Other specified abnormal findings of blood chemistry: Secondary | ICD-10-CM | POA: Diagnosis not present

## 2022-10-08 DIAGNOSIS — I10 Essential (primary) hypertension: Secondary | ICD-10-CM | POA: Diagnosis not present

## 2022-10-08 DIAGNOSIS — R944 Abnormal results of kidney function studies: Secondary | ICD-10-CM | POA: Diagnosis not present

## 2022-10-08 DIAGNOSIS — K219 Gastro-esophageal reflux disease without esophagitis: Secondary | ICD-10-CM | POA: Diagnosis not present

## 2022-10-08 DIAGNOSIS — E119 Type 2 diabetes mellitus without complications: Secondary | ICD-10-CM | POA: Diagnosis not present

## 2022-10-12 DIAGNOSIS — E119 Type 2 diabetes mellitus without complications: Secondary | ICD-10-CM | POA: Diagnosis not present

## 2022-10-12 DIAGNOSIS — Z6824 Body mass index (BMI) 24.0-24.9, adult: Secondary | ICD-10-CM | POA: Diagnosis not present

## 2022-10-12 DIAGNOSIS — Z0001 Encounter for general adult medical examination with abnormal findings: Secondary | ICD-10-CM | POA: Diagnosis not present

## 2022-10-12 DIAGNOSIS — R944 Abnormal results of kidney function studies: Secondary | ICD-10-CM | POA: Diagnosis not present

## 2022-10-12 DIAGNOSIS — I10 Essential (primary) hypertension: Secondary | ICD-10-CM | POA: Diagnosis not present

## 2022-10-12 DIAGNOSIS — M109 Gout, unspecified: Secondary | ICD-10-CM | POA: Diagnosis not present

## 2022-10-12 DIAGNOSIS — K219 Gastro-esophageal reflux disease without esophagitis: Secondary | ICD-10-CM | POA: Diagnosis not present

## 2022-10-12 DIAGNOSIS — Z23 Encounter for immunization: Secondary | ICD-10-CM | POA: Diagnosis not present

## 2022-12-16 ENCOUNTER — Encounter (HOSPITAL_COMMUNITY)
Admission: EM | Disposition: A | Discharge status: Home Health | Payer: Self-pay | Source: Home / Self Care | Attending: Cardiovascular Disease

## 2022-12-16 ENCOUNTER — Inpatient Hospital Stay (HOSPITAL_COMMUNITY)
Admission: EM | Admit: 2022-12-16 | Discharge: 2022-12-28 | DRG: 231 | Disposition: A | Discharge status: Home Health | Payer: Medicare PPO | Attending: Thoracic Surgery (Cardiothoracic Vascular Surgery) | Admitting: Thoracic Surgery (Cardiothoracic Vascular Surgery)

## 2022-12-16 ENCOUNTER — Inpatient Hospital Stay (HOSPITAL_COMMUNITY): Payer: Medicare PPO

## 2022-12-16 ENCOUNTER — Other Ambulatory Visit: Payer: Self-pay

## 2022-12-16 ENCOUNTER — Emergency Department (HOSPITAL_COMMUNITY): Payer: Medicare PPO

## 2022-12-16 ENCOUNTER — Encounter (HOSPITAL_COMMUNITY): Payer: Self-pay

## 2022-12-16 DIAGNOSIS — J9600 Acute respiratory failure, unspecified whether with hypoxia or hypercapnia: Secondary | ICD-10-CM

## 2022-12-16 DIAGNOSIS — J929 Pleural plaque without asbestos: Secondary | ICD-10-CM | POA: Diagnosis not present

## 2022-12-16 DIAGNOSIS — Z4682 Encounter for fitting and adjustment of non-vascular catheter: Secondary | ICD-10-CM | POA: Diagnosis not present

## 2022-12-16 DIAGNOSIS — E872 Acidosis, unspecified: Secondary | ICD-10-CM | POA: Diagnosis not present

## 2022-12-16 DIAGNOSIS — R0989 Other specified symptoms and signs involving the circulatory and respiratory systems: Secondary | ICD-10-CM | POA: Diagnosis not present

## 2022-12-16 DIAGNOSIS — R000 Tachycardia, unspecified: Secondary | ICD-10-CM | POA: Diagnosis present

## 2022-12-16 DIAGNOSIS — Z48812 Encounter for surgical aftercare following surgery on the circulatory system: Secondary | ICD-10-CM | POA: Diagnosis not present

## 2022-12-16 DIAGNOSIS — I5021 Acute systolic (congestive) heart failure: Secondary | ICD-10-CM | POA: Diagnosis not present

## 2022-12-16 DIAGNOSIS — E1165 Type 2 diabetes mellitus with hyperglycemia: Secondary | ICD-10-CM | POA: Diagnosis not present

## 2022-12-16 DIAGNOSIS — I213 ST elevation (STEMI) myocardial infarction of unspecified site: Secondary | ICD-10-CM | POA: Diagnosis not present

## 2022-12-16 DIAGNOSIS — I252 Old myocardial infarction: Secondary | ICD-10-CM

## 2022-12-16 DIAGNOSIS — R339 Retention of urine, unspecified: Secondary | ICD-10-CM | POA: Diagnosis not present

## 2022-12-16 DIAGNOSIS — Z87891 Personal history of nicotine dependence: Secondary | ICD-10-CM | POA: Diagnosis not present

## 2022-12-16 DIAGNOSIS — Z951 Presence of aortocoronary bypass graft: Secondary | ICD-10-CM | POA: Diagnosis not present

## 2022-12-16 DIAGNOSIS — I5023 Acute on chronic systolic (congestive) heart failure: Secondary | ICD-10-CM | POA: Diagnosis not present

## 2022-12-16 DIAGNOSIS — R451 Restlessness and agitation: Secondary | ICD-10-CM | POA: Diagnosis not present

## 2022-12-16 DIAGNOSIS — R079 Chest pain, unspecified: Secondary | ICD-10-CM | POA: Diagnosis not present

## 2022-12-16 DIAGNOSIS — I110 Hypertensive heart disease with heart failure: Secondary | ICD-10-CM | POA: Diagnosis not present

## 2022-12-16 DIAGNOSIS — E785 Hyperlipidemia, unspecified: Secondary | ICD-10-CM | POA: Diagnosis present

## 2022-12-16 DIAGNOSIS — I255 Ischemic cardiomyopathy: Secondary | ICD-10-CM | POA: Diagnosis present

## 2022-12-16 DIAGNOSIS — K5900 Constipation, unspecified: Secondary | ICD-10-CM | POA: Diagnosis not present

## 2022-12-16 DIAGNOSIS — Y838 Other surgical procedures as the cause of abnormal reaction of the patient, or of later complication, without mention of misadventure at the time of the procedure: Secondary | ICD-10-CM | POA: Diagnosis not present

## 2022-12-16 DIAGNOSIS — J9382 Other air leak: Secondary | ICD-10-CM | POA: Diagnosis not present

## 2022-12-16 DIAGNOSIS — I2584 Coronary atherosclerosis due to calcified coronary lesion: Secondary | ICD-10-CM | POA: Diagnosis present

## 2022-12-16 DIAGNOSIS — Z1152 Encounter for screening for COVID-19: Secondary | ICD-10-CM

## 2022-12-16 DIAGNOSIS — I2102 ST elevation (STEMI) myocardial infarction involving left anterior descending coronary artery: Secondary | ICD-10-CM

## 2022-12-16 DIAGNOSIS — I2109 ST elevation (STEMI) myocardial infarction involving other coronary artery of anterior wall: Secondary | ICD-10-CM | POA: Diagnosis not present

## 2022-12-16 DIAGNOSIS — Z9861 Coronary angioplasty status: Secondary | ICD-10-CM

## 2022-12-16 DIAGNOSIS — Z79899 Other long term (current) drug therapy: Secondary | ICD-10-CM | POA: Diagnosis not present

## 2022-12-16 DIAGNOSIS — I97190 Other postprocedural cardiac functional disturbances following cardiac surgery: Secondary | ICD-10-CM | POA: Diagnosis not present

## 2022-12-16 DIAGNOSIS — Z7984 Long term (current) use of oral hypoglycemic drugs: Secondary | ICD-10-CM

## 2022-12-16 DIAGNOSIS — M109 Gout, unspecified: Secondary | ICD-10-CM | POA: Diagnosis present

## 2022-12-16 DIAGNOSIS — I25110 Atherosclerotic heart disease of native coronary artery with unstable angina pectoris: Secondary | ICD-10-CM | POA: Diagnosis not present

## 2022-12-16 DIAGNOSIS — I2510 Atherosclerotic heart disease of native coronary artery without angina pectoris: Secondary | ICD-10-CM

## 2022-12-16 DIAGNOSIS — E871 Hypo-osmolality and hyponatremia: Secondary | ICD-10-CM | POA: Diagnosis not present

## 2022-12-16 DIAGNOSIS — J90 Pleural effusion, not elsewhere classified: Secondary | ICD-10-CM | POA: Diagnosis not present

## 2022-12-16 DIAGNOSIS — I4891 Unspecified atrial fibrillation: Secondary | ICD-10-CM | POA: Diagnosis not present

## 2022-12-16 DIAGNOSIS — E118 Type 2 diabetes mellitus with unspecified complications: Secondary | ICD-10-CM | POA: Diagnosis not present

## 2022-12-16 DIAGNOSIS — Z01810 Encounter for preprocedural cardiovascular examination: Secondary | ICD-10-CM

## 2022-12-16 DIAGNOSIS — J939 Pneumothorax, unspecified: Secondary | ICD-10-CM | POA: Diagnosis not present

## 2022-12-16 DIAGNOSIS — R570 Cardiogenic shock: Secondary | ICD-10-CM | POA: Diagnosis not present

## 2022-12-16 DIAGNOSIS — I493 Ventricular premature depolarization: Secondary | ICD-10-CM | POA: Diagnosis not present

## 2022-12-16 DIAGNOSIS — R918 Other nonspecific abnormal finding of lung field: Secondary | ICD-10-CM | POA: Diagnosis not present

## 2022-12-16 DIAGNOSIS — D62 Acute posthemorrhagic anemia: Secondary | ICD-10-CM | POA: Diagnosis not present

## 2022-12-16 HISTORY — PX: CORONARY/GRAFT ACUTE MI REVASCULARIZATION: CATH118305

## 2022-12-16 HISTORY — DX: Type 2 diabetes mellitus without complications: E11.9

## 2022-12-16 HISTORY — PX: LEFT HEART CATH AND CORONARY ANGIOGRAPHY: CATH118249

## 2022-12-16 LAB — ECHOCARDIOGRAM COMPLETE
Area-P 1/2: 4.31 cm2
Calc EF: 38.6 %
Height: 70 in
S' Lateral: 3.4 cm
Single Plane A2C EF: 46.9 %
Single Plane A4C EF: 24 %
Weight: 2560 [oz_av]

## 2022-12-16 LAB — CBC WITH DIFFERENTIAL/PLATELET
Abs Immature Granulocytes: 0.08 10*3/uL — ABNORMAL HIGH (ref 0.00–0.07)
Basophils Absolute: 0.1 10*3/uL (ref 0.0–0.1)
Basophils Relative: 0 %
Eosinophils Absolute: 0.4 10*3/uL (ref 0.0–0.5)
Eosinophils Relative: 3 %
HCT: 41.9 % (ref 39.0–52.0)
Hemoglobin: 13.9 g/dL (ref 13.0–17.0)
Immature Granulocytes: 1 %
Lymphocytes Relative: 16 %
Lymphs Abs: 2.5 10*3/uL (ref 0.7–4.0)
MCH: 31.3 pg (ref 26.0–34.0)
MCHC: 33.2 g/dL (ref 30.0–36.0)
MCV: 94.4 fL (ref 80.0–100.0)
Monocytes Absolute: 0.9 10*3/uL (ref 0.1–1.0)
Monocytes Relative: 6 %
Neutro Abs: 11.4 10*3/uL — ABNORMAL HIGH (ref 1.7–7.7)
Neutrophils Relative %: 74 %
Platelets: 317 10*3/uL (ref 150–400)
RBC: 4.44 MIL/uL (ref 4.22–5.81)
RDW: 14.6 % (ref 11.5–15.5)
WBC: 15.4 10*3/uL — ABNORMAL HIGH (ref 4.0–10.5)
nRBC: 0 % (ref 0.0–0.2)

## 2022-12-16 LAB — COMPREHENSIVE METABOLIC PANEL
ALT: 15 U/L (ref 0–44)
AST: 33 U/L (ref 15–41)
Albumin: 3.6 g/dL (ref 3.5–5.0)
Alkaline Phosphatase: 169 U/L — ABNORMAL HIGH (ref 38–126)
Anion gap: 13 (ref 5–15)
BUN: 13 mg/dL (ref 8–23)
CO2: 24 mmol/L (ref 22–32)
Calcium: 9.1 mg/dL (ref 8.9–10.3)
Chloride: 98 mmol/L (ref 98–111)
Creatinine, Ser: 0.87 mg/dL (ref 0.61–1.24)
GFR, Estimated: >60 mL/min (ref >60)
Glucose, Bld: 250 mg/dL — ABNORMAL HIGH (ref 70–99)
Potassium: 3 mmol/L — ABNORMAL LOW (ref 3.5–5.1)
Sodium: 135 mmol/L (ref 135–145)
Total Bilirubin: 0.7 mg/dL (ref 0.3–1.2)
Total Protein: 7.9 g/dL (ref 6.5–8.1)

## 2022-12-16 LAB — POCT ACTIVATED CLOTTING TIME: Activated Clotting Time: 336 s

## 2022-12-16 LAB — LIPID PANEL
Cholesterol: 203 mg/dL — ABNORMAL HIGH (ref 0–200)
HDL: 39 mg/dL — ABNORMAL LOW (ref >40)
LDL Cholesterol: 139 mg/dL — ABNORMAL HIGH (ref 0–99)
Total CHOL/HDL Ratio: 5.2 ratio
Triglycerides: 126 mg/dL (ref <150)
VLDL: 25 mg/dL (ref 0–40)

## 2022-12-16 LAB — TROPONIN I (HIGH SENSITIVITY)
Troponin I (High Sensitivity): 2608 ng/L (ref <18)
Troponin I (High Sensitivity): 5137 ng/L (ref <18)

## 2022-12-16 LAB — SURGICAL PCR SCREEN
MRSA, PCR: NEGATIVE
Staphylococcus aureus: NEGATIVE

## 2022-12-16 LAB — VAS US DOPPLER PRE CABG
Left ABI: 1.1
Right ABI: 1.14

## 2022-12-16 LAB — PROTIME-INR
INR: 0.9 (ref 0.8–1.2)
Prothrombin Time: 12.8 s (ref 11.4–15.2)

## 2022-12-16 LAB — MRSA NEXT GEN BY PCR, NASAL: MRSA by PCR Next Gen: NOT DETECTED

## 2022-12-16 LAB — CG4 I-STAT (LACTIC ACID): Lactic Acid, Venous: 2.4 mmol/L (ref 0.5–1.9)

## 2022-12-16 LAB — APTT: aPTT: 31 s (ref 24–36)

## 2022-12-16 LAB — LACTIC ACID, PLASMA: Lactic Acid, Venous: 2.5 mmol/L (ref 0.5–1.9)

## 2022-12-16 SURGERY — CORONARY/GRAFT ACUTE MI REVASCULARIZATION
Anesthesia: LOCAL

## 2022-12-16 MED ORDER — HEPARIN (PORCINE) IN NACL 1000-0.9 UT/500ML-% IV SOLN
INTRAVENOUS | Status: DC | PRN
  Administered 2022-12-16 (×2): 500 mL

## 2022-12-16 MED ORDER — HEPARIN (PORCINE) 25000 UT/250ML-% IV SOLN: 850.0000 [IU]/h | INTRAVENOUS | Status: DC

## 2022-12-16 MED ORDER — VERAPAMIL HCL 2.5 MG/ML IV SOLN
INTRAVENOUS | Status: AC
  Filled 2022-12-16: qty 2

## 2022-12-16 MED ORDER — NOREPINEPHRINE 4 MG/250ML-% IV SOLN
0.0000 ug/min | INTRAVENOUS | Status: DC
  Filled 2022-12-16: qty 250

## 2022-12-16 MED ORDER — ALUM & MAG HYDROXIDE-SIMETH 200-200-20 MG/5ML PO SUSP
30.0000 mL | ORAL | Status: DC | PRN
  Administered 2022-12-16 – 2022-12-22 (×7): 30 mL via ORAL
  Filled 2022-12-16 (×7): qty 30

## 2022-12-16 MED ORDER — HYDRALAZINE HCL 20 MG/ML IJ SOLN: 10.0000 mg | INTRAMUSCULAR | Status: AC | PRN

## 2022-12-16 MED ORDER — MANNITOL 20 % IV SOLN
INTRAVENOUS | Status: DC
  Filled 2022-12-16: qty 13

## 2022-12-16 MED ORDER — MAGNESIUM SULFATE 50 % IJ SOLN
40.0000 meq | INTRAMUSCULAR | Status: DC
  Filled 2022-12-16: qty 9.85

## 2022-12-16 MED ORDER — TIROFIBAN HCL IN NACL 5-0.9 MG/100ML-% IV SOLN
INTRAVENOUS | Status: AC
  Filled 2022-12-16: qty 100

## 2022-12-16 MED ORDER — SODIUM CHLORIDE 0.9 % IV SOLN: INTRAVENOUS | Status: AC

## 2022-12-16 MED ORDER — PLASMA-LYTE A IV SOLN
INTRAVENOUS | Status: DC
  Filled 2022-12-16: qty 2.5

## 2022-12-16 MED ORDER — ASPIRIN 81 MG PO CHEW
324.0000 mg | CHEWABLE_TABLET | Freq: Once | ORAL | Status: AC
  Administered 2022-12-16: 324 mg via ORAL

## 2022-12-16 MED ORDER — CHLORHEXIDINE GLUCONATE CLOTH 2 % EX PADS
6.0000 | MEDICATED_PAD | Freq: Every day | CUTANEOUS | Status: DC
  Administered 2022-12-16 – 2022-12-22 (×7): 6 via TOPICAL

## 2022-12-16 MED ORDER — IOHEXOL 350 MG/ML SOLN
INTRAVENOUS | Status: DC | PRN
  Administered 2022-12-16: 75 mL

## 2022-12-16 MED ORDER — HEPARIN SODIUM (PORCINE) 1000 UNIT/ML IJ SOLN
INTRAMUSCULAR | Status: AC
  Filled 2022-12-16: qty 10

## 2022-12-16 MED ORDER — SODIUM CHLORIDE 0.9% FLUSH: 3.0000 mL | INTRAVENOUS | Status: DC | PRN

## 2022-12-16 MED ORDER — FENTANYL CITRATE (PF) 100 MCG/2ML IJ SOLN
INTRAMUSCULAR | Status: DC | PRN
  Administered 2022-12-16: 25 ug via INTRAVENOUS

## 2022-12-16 MED ORDER — VERAPAMIL HCL 2.5 MG/ML IV SOLN
INTRAVENOUS | Status: DC | PRN
  Administered 2022-12-16: 10 mL via INTRA_ARTERIAL

## 2022-12-16 MED ORDER — FENTANYL CITRATE (PF) 100 MCG/2ML IJ SOLN
INTRAMUSCULAR | Status: AC
  Filled 2022-12-16: qty 2

## 2022-12-16 MED ORDER — LIDOCAINE HCL (PF) 1 % IJ SOLN
INTRAMUSCULAR | Status: DC | PRN
  Administered 2022-12-16: 5 mL via INTRADERMAL

## 2022-12-16 MED ORDER — LABETALOL HCL 5 MG/ML IV SOLN: 10.0000 mg | INTRAVENOUS | Status: AC | PRN

## 2022-12-16 MED ORDER — VANCOMYCIN HCL 1000 MG IV SOLR
INTRAVENOUS | Status: DC
  Filled 2022-12-16: qty 20

## 2022-12-16 MED ORDER — MIDAZOLAM HCL 2 MG/2ML IJ SOLN
INTRAMUSCULAR | Status: AC
  Filled 2022-12-16: qty 2

## 2022-12-16 MED ORDER — NITROGLYCERIN IN D5W 200-5 MCG/ML-% IV SOLN
2.0000 ug/min | INTRAVENOUS | Status: DC
  Filled 2022-12-16: qty 250

## 2022-12-16 MED ORDER — ORAL CARE MOUTH RINSE: 15.0000 mL | OROMUCOSAL | Status: DC | PRN

## 2022-12-16 MED ORDER — VANCOMYCIN HCL 1250 MG/250ML IV SOLN
1250.0000 mg | INTRAVENOUS | Status: DC
  Filled 2022-12-16: qty 250

## 2022-12-16 MED ORDER — EPINEPHRINE HCL 5 MG/250ML IV SOLN IN NS
0.0000 ug/min | INTRAVENOUS | Status: DC
  Filled 2022-12-16: qty 250

## 2022-12-16 MED ORDER — SODIUM CHLORIDE 0.9 % IV SOLN: 250.0000 mL | INTRAVENOUS | Status: DC | PRN

## 2022-12-16 MED ORDER — SODIUM CHLORIDE 0.9% FLUSH
3.0000 mL | Freq: Two times a day (BID) | INTRAVENOUS | Status: DC
  Administered 2022-12-17 – 2022-12-21 (×5): 3 mL via INTRAVENOUS

## 2022-12-16 MED ORDER — TRANEXAMIC ACID (OHS) BOLUS VIA INFUSION
15.0000 mg/kg | INTRAVENOUS | Status: DC
  Filled 2022-12-16: qty 1089

## 2022-12-16 MED ORDER — CEFAZOLIN SODIUM-DEXTROSE 2-4 GM/100ML-% IV SOLN
2.0000 g | INTRAVENOUS | Status: DC
  Filled 2022-12-16: qty 100

## 2022-12-16 MED ORDER — SODIUM CHLORIDE 0.9 % IV SOLN: INTRAVENOUS | Status: DC | PRN

## 2022-12-16 MED ORDER — TIROFIBAN (AGGRASTAT) BOLUS VIA INFUSION
INTRAVENOUS | Status: DC | PRN
  Administered 2022-12-16: 1815 ug via INTRAVENOUS

## 2022-12-16 MED ORDER — ALLOPURINOL 300 MG PO TABS
300.0000 mg | ORAL_TABLET | Freq: Every evening | ORAL | Status: DC
  Administered 2022-12-16 – 2022-12-21 (×6): 300 mg via ORAL
  Filled 2022-12-16 (×6): qty 1

## 2022-12-16 MED ORDER — PERFLUTREN LIPID MICROSPHERE
1.0000 mL | INTRAVENOUS | Status: AC | PRN
  Administered 2022-12-16: 2 mL via INTRAVENOUS

## 2022-12-16 MED ORDER — NITROGLYCERIN 1 MG/10 ML FOR IR/CATH LAB
INTRA_ARTERIAL | Status: AC
  Filled 2022-12-16: qty 10

## 2022-12-16 MED ORDER — MILRINONE LACTATE IN DEXTROSE 20-5 MG/100ML-% IV SOLN
0.3000 ug/kg/min | INTRAVENOUS | Status: DC
  Filled 2022-12-16: qty 100

## 2022-12-16 MED ORDER — HEPARIN BOLUS VIA INFUSION: 4000.0000 [IU] | Freq: Once | INTRAVENOUS | Status: DC

## 2022-12-16 MED ORDER — ONDANSETRON HCL 4 MG/2ML IJ SOLN
4.0000 mg | Freq: Four times a day (QID) | INTRAMUSCULAR | Status: DC | PRN
  Administered 2022-12-22: 4 mg via INTRAVENOUS
  Filled 2022-12-16: qty 2

## 2022-12-16 MED ORDER — HEPARIN SODIUM (PORCINE) 1000 UNIT/ML IJ SOLN
INTRAMUSCULAR | Status: DC | PRN
  Administered 2022-12-16: 10000 [IU] via INTRAVENOUS

## 2022-12-16 MED ORDER — SODIUM CHLORIDE 0.9 % IV SOLN: INTRAVENOUS | Status: DC

## 2022-12-16 MED ORDER — LIDOCAINE HCL (PF) 1 % IJ SOLN
INTRAMUSCULAR | Status: AC
  Filled 2022-12-16: qty 30

## 2022-12-16 MED ORDER — TIROFIBAN HCL IN NACL 5-0.9 MG/100ML-% IV SOLN
INTRAVENOUS | Status: DC | PRN
  Administered 2022-12-16: .15 ug/kg/min via INTRAVENOUS

## 2022-12-16 MED ORDER — ASPIRIN 81 MG PO CHEW
81.0000 mg | CHEWABLE_TABLET | Freq: Every day | ORAL | Status: DC
  Administered 2022-12-17 – 2022-12-21 (×5): 81 mg via ORAL
  Filled 2022-12-16 (×5): qty 1

## 2022-12-16 MED ORDER — DEXMEDETOMIDINE HCL IN NACL 400 MCG/100ML IV SOLN
0.1000 ug/kg/h | INTRAVENOUS | Status: DC
  Filled 2022-12-16: qty 100

## 2022-12-16 MED ORDER — NITROGLYCERIN IN D5W 200-5 MCG/ML-% IV SOLN
0.0000 ug/min | INTRAVENOUS | Status: DC
  Filled 2022-12-16: qty 250

## 2022-12-16 MED ORDER — MUPIROCIN 2 % EX OINT
1.0000 | TOPICAL_OINTMENT | Freq: Two times a day (BID) | CUTANEOUS | Status: DC
  Administered 2022-12-17: 1 via NASAL
  Filled 2022-12-16: qty 22

## 2022-12-16 MED ORDER — POTASSIUM CHLORIDE 2 MEQ/ML IV SOLN
80.0000 meq | INTRAVENOUS | Status: DC
  Filled 2022-12-16: qty 40

## 2022-12-16 MED ORDER — TRANEXAMIC ACID 1000 MG/10ML IV SOLN
1.5000 mg/kg/h | INTRAVENOUS | Status: DC
  Filled 2022-12-16: qty 25

## 2022-12-16 MED ORDER — NITROGLYCERIN 2 % TD OINT
1.0000 [in_us] | TOPICAL_OINTMENT | Freq: Once | TRANSDERMAL | Status: DC
  Filled 2022-12-16: qty 1

## 2022-12-16 MED ORDER — MIDAZOLAM HCL 2 MG/2ML IJ SOLN
INTRAMUSCULAR | Status: DC | PRN
  Administered 2022-12-16: 1 mg via INTRAVENOUS

## 2022-12-16 MED ORDER — TRANEXAMIC ACID (OHS) PUMP PRIME SOLUTION
2.0000 mg/kg | INTRAVENOUS | Status: DC
  Filled 2022-12-16: qty 1.45

## 2022-12-16 MED ORDER — TIROFIBAN HCL IN NACL 5-0.9 MG/100ML-% IV SOLN
0.1500 ug/kg/min | INTRAVENOUS | Status: DC
  Administered 2022-12-16 (×2): 0.15 ug/kg/min via INTRAVENOUS
  Filled 2022-12-16 (×3): qty 100

## 2022-12-16 MED ORDER — PHENYLEPHRINE HCL-NACL 20-0.9 MG/250ML-% IV SOLN
30.0000 ug/min | INTRAVENOUS | Status: DC
  Filled 2022-12-16: qty 250

## 2022-12-16 MED ORDER — HEPARIN 30,000 UNITS/1000 ML (OHS) CELLSAVER SOLUTION
Status: DC
  Filled 2022-12-16: qty 1000

## 2022-12-16 MED ORDER — ACETAMINOPHEN 325 MG PO TABS: 650.0000 mg | ORAL_TABLET | ORAL | Status: DC | PRN

## 2022-12-16 MED ORDER — INSULIN REGULAR(HUMAN) IN NACL 100-0.9 UT/100ML-% IV SOLN
INTRAVENOUS | Status: DC
  Filled 2022-12-16: qty 100

## 2022-12-16 MED ORDER — ATORVASTATIN CALCIUM 80 MG PO TABS
80.0000 mg | ORAL_TABLET | Freq: Every day | ORAL | Status: DC
  Administered 2022-12-16 – 2022-12-21 (×6): 80 mg via ORAL
  Filled 2022-12-16 (×6): qty 1

## 2022-12-16 MED ORDER — POTASSIUM CHLORIDE CRYS ER 20 MEQ PO TBCR
60.0000 meq | EXTENDED_RELEASE_TABLET | Freq: Every day | ORAL | Status: DC
  Administered 2022-12-16 – 2022-12-18 (×3): 60 meq via ORAL
  Filled 2022-12-16 (×4): qty 3

## 2022-12-16 SURGICAL SUPPLY — 18 items
BALLN EMERGE MR 2.0X15 (BALLOONS) ×1
BALLN SCOREFLEX 2.50X10 (BALLOONS) ×1
BALLN ~~LOC~~ EMERGE MR 2.5X15 (BALLOONS) ×1
BALLOON EMERGE MR 2.0X15 (BALLOONS) IMPLANT
BALLOON SCOREFLEX 2.50X10 (BALLOONS) IMPLANT
BALLOON ~~LOC~~ EMERGE MR 2.5X15 (BALLOONS) IMPLANT
CATH INFINITI JR4 5F (CATHETERS) IMPLANT
CATH VISTA GUIDE 6FR XBLAD3.5 (CATHETERS) IMPLANT
DEVICE RAD TR BAND REGULAR (VASCULAR PRODUCTS) IMPLANT
ELECT DEFIB PAD ADLT CADENCE (PAD) IMPLANT
GLIDESHEATH SLEND SS 6F .021 (SHEATH) IMPLANT
GUIDEWIRE INQWIRE 1.5J.035X260 (WIRE) IMPLANT
INQWIRE 1.5J .035X260CM (WIRE) ×2
KIT ENCORE 26 ADVANTAGE (KITS) IMPLANT
PACK CARDIAC CATHETERIZATION (CUSTOM PROCEDURE TRAY) ×1 IMPLANT
SET ATX-X65L (MISCELLANEOUS) IMPLANT
TUBING CIL FLEX 10 FLL-RA (TUBING) IMPLANT
WIRE COUGAR XT STRL 190CM (WIRE) IMPLANT

## 2022-12-16 NOTE — Consult Note (Signed)
[address]       [place] [address]             [phone]           Eartha Inch [facility] Medical Record #[id number] Date of Birth: [birth year]  No ref. provider found Lovey Newcomer, [name]  Chief Complaint:    Chief Complaint  Patient presents with   Chest Pain    History of Present Illness:     Pt is nearly an 82 yo male with no previous cardiac history who developed CP last night that recurred this am. Pt with associated nausea and SOB. PT seen in outside hospital and STEMI diagnosed and sent to [facility] cath lab where severe CAD found and only able to balloon distal LAD. Pt now CP free. No echo yet.  Was asked to evaluate for CABG      Past Medical History:  Diagnosis Date   Gout    HTN (hypertension)     History reviewed. No pertinent surgical history.  Social History   Tobacco Use  Smoking Status Former   Current packs/day: 0.00   Types: Cigarettes   Quit date: 04/05/2006   Years since quitting: 16.7  Smokeless Tobacco Never    Social History   Substance and Sexual Activity  Alcohol Use No    Social History   Socioeconomic History   Marital status: Divorced    Spouse name: Not on file   Number of children: Not on file   Years of education: Not on file   Highest education level: Not on file  Occupational History   Not on file  Tobacco Use   Smoking status: Former    Current packs/day: 0.00    Types: Cigarettes    Quit date: 04/05/2006    Years since quitting: 16.7   Smokeless tobacco: Never  Substance and Sexual Activity   Alcohol use: No   Drug use: No   Sexual activity: Not on file  Other Topics Concern   Not on file  Social History Narrative   Not on file   Social Determinants of Health   Financial Resource Strain: Not on file  Food Insecurity: Not on file  Transportation Needs: Not on file  Physical Activity: Not on file  Stress: Not on file  Social Connections: Not on file  Intimate Partner Violence: Not  on file    No Known Allergies  Current Facility-Administered Medications  Medication Dose Route Frequency Provider Last Rate Last Admin   0.9 %  sodium chloride infusion   Intravenous Continuous Terrilee Files, MD   Stopped at 12/16/22 1205   0.9 %  sodium chloride infusion   Intravenous Continuous Kathleene Hazel, MD 50 mL/hr at 12/16/22 1207 New Bag at 12/16/22 1207   0.9 %  sodium chloride infusion  250 mL Intravenous PRN Kathleene Hazel, MD       acetaminophen (TYLENOL) tablet 650 mg  650 mg Oral Q4H PRN Kathleene Hazel, MD       allopurinol (ZYLOPRIM) tablet 300 mg  300 mg Oral QPM Kathleene Hazel, MD       [START ON 12/17/2022] aspirin chewable tablet 81 mg  81 mg Oral Daily Kathleene Hazel, MD       atorvastatin (LIPITOR) tablet 80 mg  80 mg Oral Daily Kathleene Hazel, MD   80 mg at 12/16/22 1220   Chlorhexidine Gluconate Cloth 2 % PADS 6 each  6 each Topical Daily Kathleene Hazel, MD   6 each at 12/16/22 1200   heparin bolus via infusion 4,000 Units  4,000 Units Intravenous Once Terrilee Files, MD       hydrALAZINE (APRESOLINE) injection 10 mg  10 mg Intravenous Q20 Min PRN Kathleene Hazel, MD       labetalol (NORMODYNE) injection 10 mg  10 mg Intravenous Q10 min PRN Kathleene Hazel, MD       nitroGLYCERIN (NITROGLYN) 2 % ointment 1 inch  1 inch Topical Once Terrilee Files, MD       nitroGLYCERIN 100 mcg/mL intra-arterial injection            ondansetron (ZOFRAN) injection 4 mg  4 mg Intravenous Q6H PRN Kathleene Hazel, MD       Oral care mouth rinse  15 mL Mouth Rinse PRN Kathleene Hazel, MD       potassium chloride SA (KLOR-CON M) CR tablet 60 mEq  60 mEq Oral Daily Kathleene Hazel, MD       sodium chloride flush (NS) 0.9 % injection 3 mL  3 mL Intravenous Q12H Kathleene Hazel, MD       sodium chloride flush (NS) 0.9 % injection 3 mL  3 mL Intravenous PRN Kathleene Hazel, MD       tirofiban (AGGRASTAT) infusion 50 mcg/mL 100 mL  0.15 mcg/kg/min Intravenous Continuous Kathleene Hazel, MD 12.96 mL/hr at 12/16/22 1111 0.15 mcg/kg/min at 12/16/22 1111     History reviewed. No pertinent family history.     Physical Exam: BP 97/63   Pulse 87   Resp 20   Ht 5\' 10"  (1.778 m)   Wt 72.6 kg   SpO2 96%   BMI 22.96 kg/m  Lungs: clear Card: RR Ext: warm without vericosities Neuro: intact    Diagnostic Studies & Laboratory data: I have personally reviewed the following studies and agree with the findings     Recent Radiology Findings:   CARDIAC CATHETERIZATION  Result Date: 12/16/2022   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   Prox RCA lesion is 100% stenosed.   Mid Cx to Dist Cx lesion is 100% stenosed.   Mid LAD-1 lesion is 99% stenosed.   Mid LAD-2 lesion is 99% stenosed.   Balloon angioplasty was performed using a BALLN EMERGE MR 2.0X15.   Balloon angioplasty was performed using a BALLN Edom EMERGE MR 2.5X15.   Post intervention, there is a 90% residual stenosis.   Post intervention, there is a 99% residual stenosis. Acute anterior STEMI The LAD is a large caliber vessel that courses to the apex. The mid LAD has serial 99% heavily calcified stenoses. The distal LAD has mild non-obstructive disease The Circumflex has 100% chronic occlusion in the mid AV groove segment. The distal AV groove Circumflex fills from left to left collaterals. The large dominant RCA has 100% chronic proximal occlusion. The distal RCA fills from right to right and left to right collaterals. Balloon angioplasty of the mid LAD with inability to full expand the balloon due to heavy calcification. TIMI-3 flow at the conclusion of the case. The patient was chest pain free. Recommendations: Will admit to ICU. I was unable to fully expand the lesions in the mid LAD but flow down the LAD was improved with balloon angioplasty and with medical therapy. Given the severe  calcific stenosis of the mid LAD, I could deliver a scoring balloon or Shockwave  lithotripsy balloon across the lesion. Will ask CT surgery to see him to discuss bypass surgery. Echo later today. Continue Aggrastat infusion for now. Will continue ASA and start a high intensity statin. (No oral anti-platelet agent was given).   DG Chest Port 1 View  Result Date: 12/16/2022 CLINICAL DATA:  Chest pain EXAM: PORTABLE CHEST 1 VIEW COMPARISON:  CT 01/11/2018 FINDINGS: Overlapping cardiac leads. Defibrillator pads. Normal cardiopericardial silhouette. Hyperinflation. Diffuse interstitial changes, likely chronic. There is a nodular opacity along the right upper lung with a apical pleural thickening which is seen on the prior CT scan of 2019. Slight blunting of the costophrenic angle on the right. Tiny effusion versus thickening. No new consolidation. No pneumothorax. Film is under penetrated. IMPRESSION: Hyperinflation with the extensive chronic changes including some nodular opacity in the right lung apex with pleural thickening. Tiny right effusion versus pleural thickening Electronically Signed   By: Karen Kays M.D.   On: 12/16/2022 10:50      Recent Lab Findings: Lab Results  Component Value Date   WBC 15.4 (H) 12/16/2022   HGB 13.9 12/16/2022   HCT 41.9 12/16/2022   PLT 317 12/16/2022   GLUCOSE 250 (H) 12/16/2022   CHOL 203 (H) 12/16/2022   TRIG 126 12/16/2022   HDL 39 (L) 12/16/2022   LDLCALC 139 (H) 12/16/2022   ALT 15 12/16/2022   AST 33 12/16/2022   NA 135 12/16/2022   K 3.0 (L) 12/16/2022   CL 98 12/16/2022   CREATININE 0.87 12/16/2022   BUN 13 12/16/2022   CO2 24 12/16/2022   INR 0.9 12/16/2022      Assessment / Plan:     82 yo with STEMI treated with balloon angioplasty of LAD. Pt with 100% RCA and distal OM. Awaiting echo. All the risks and goals of surgery along with recovery were discussed with pt and son and they understand and wish to proceed. Targets include LAD, OM and  PLAT   I have spent 60 min in review of the records, viewing studies and in face to face with patient and in coordination of future care    Eugenio Hoes 12/16/2022 12:37 PM

## 2022-12-16 NOTE — ED Notes (Signed)
Report given Terry White with the cath lab; EMS here to transport

## 2022-12-16 NOTE — H&P (Addendum)
Cardiology Admission History and Physical   Terry White ID: Terry White MRN: [id number]; DOB: 15-Aug-1940   Admission date: 12/16/2022  PCP:  Lovey Newcomer, PA   [facility] Providers Cardiologist:  Verne Carrow, MD   Chief Complaint:  anterior STEMI  Terry White Profile:   Terry White is a 82 y.o. male with DM, hypertension, and no prior cardiac history who is being seen 12/16/2022 for the evaluation of anterior STEMI and chest pain.  History of Present Illness:   Terry White has a 60-pack-year smoking history but quit in 2008.  Terry White was admitted in 2018 with weight loss found to have pneumonia.  Terry White was last seen by pulmonology in 2018.  Hypertension managed with lisinopril.  Terry White has no known prior cardiac history.  Terry White presented to [facility], ER with sudden onset of chest pain that started yesterday, 12/15/2022.  This was associated with nausea, dizziness, shortness of breath, diaphoresis.  Chest pain lasted for 1 hour.  Unfortunately chest pain returned at 9 AM today, 12/16/2022 prompting ER evaluation.  EKG showed ST elevation in V2-V4 with Q waves in inferior leads.  Terry White had ongoing chest pain and a code STEMI was called.  Terry White was emergently transported to [facility] Lab for revascularization. Terry White is on heparin drip, has received 324 mg aspirin, and nitroglycerin ointment.   Past Medical History:  Diagnosis Date   Gout    HTN (hypertension)     History reviewed. No pertinent surgical history.   Medications Prior to Admission: Prior to Admission medications   Medication Sig Start Date End Date Taking? Authorizing Provider  allopurinol (ZYLOPRIM) 300 MG tablet Take 300 mg by mouth every evening.     [provider]  IRON PO Take 1 tablet by mouth every evening.    [provider]  lisinopril (PRINIVIL,ZESTRIL) 40 MG tablet Take 40 mg by mouth every evening.    [provider]  Omega-3 Fatty Acids (FISH OIL PO) Take 1 capsule by  mouth every evening.    [provider]     Allergies:   No Known Allergies  Social History:   Social History   Socioeconomic History   Marital status: Divorced    Spouse name: Not on file   Number of children: Not on file   Years of education: Not on file   Highest education level: Not on file  Occupational History   Not on file  Tobacco Use   Smoking status: Former    Current packs/day: 0.00    Types: Cigarettes    Quit date: 04/05/2006    Years since quitting: 16.7   Smokeless tobacco: Never  Substance and Sexual Activity   Alcohol use: No   Drug use: No   Sexual activity: Not on file  Other Topics Concern   Not on file  Social History Narrative   Not on file   Social Determinants of Health   Financial Resource Strain: Not on file  Food Insecurity: Not on file  Transportation Needs: Not on file  Physical Activity: Not on file  Stress: Not on file  Social Connections: Not on file  Intimate Partner Violence: Not on file    Family History:   The Terry White's family history is not on file.    ROS:  Please see the history of present illness.  All other ROS reviewed and negative.     Physical Exam/Data:   Vitals:   12/16/22 0941 12/16/22 0949 12/16/22 0950  BP:  Marland Kitchen)  150/122   Pulse:   (!) 111  Resp:  19 (!) 24  SpO2:   95%  Weight: 72.6 kg    Height: 5\' 10"  (1.778 m)     No intake or output data in the 24 hours ending 12/16/22 1030    12/16/2022    9:41 AM 01/03/2017   11:33 AM 08/04/2016   10:48 AM  Last 3 Weights  Weight (lbs) 160 lb 174 lb 4 oz 166 lb 9.6 oz  Weight (kg) 72.576 kg 79.039 kg 75.569 kg     Body mass index is 22.96 kg/m.  Exam per MD  EKG:  The ECG that was done  was personally reviewed and demonstrates sinus tachycardia with HR 109, STE V2-4, Q waves inferior leads  Relevant CV Studies:  LHC pending  Laboratory Data:  High Sensitivity Troponin:   Recent Labs  Lab 12/16/22 0954  TROPONINIHS 2,608*       Chemistry Recent Labs  Lab 12/16/22 0954  NA 135  K 3.0*  CL 98  CO2 24  GLUCOSE 250*  BUN 13  CREATININE 0.87  CALCIUM 9.1  GFRNONAA >60  ANIONGAP 13    Recent Labs  Lab 12/16/22 0954  PROT 7.9  ALBUMIN 3.6  AST 33  ALT 15  ALKPHOS 169*  BILITOT 0.7   Lipids  Recent Labs  Lab 12/16/22 0954  CHOL 203*  TRIG 126  HDL 39*  LDLCALC 139*  CHOLHDL 5.2   Hematology Recent Labs  Lab 12/16/22 0954  WBC 15.4*  RBC 4.44  HGB 13.9  HCT 41.9  MCV 94.4  MCH 31.3  MCHC 33.2  RDW 14.6  PLT 317   Thyroid No results for input(s): "TSH", "FREET4" in the last 168 hours. BNPNo results for input(s): "BNP", "PROBNP" in the last 168 hours.  DDimer No results for input(s): "DDIMER" in the last 168 hours.   Radiology/Studies:  No results found.   Assessment and Plan:   Acute anterior STEMI - EKG with STE V2-4 and Q waves inferiorly - Terry White was transported emergently to [facility] cath lab for revascularization - continue heparin gtt, nitroglycerin - LHC pending   Hypertension - PTA on lisinopril   DM - SSI, collect A1c   Hx of tobacco abuse - quit in 2008     Risk Assessment/Risk Scores:    TIMI Risk Score for ST  Elevation MI:   The Terry White's TIMI risk score is 7, which indicates a 23.4% risk of all cause mortality at 30 days.       Code Status: Full Code  Severity of Illness: The appropriate Terry White status for this Terry White is INPATIENT. Inpatient status is judged to be reasonable and necessary in order to provide the required intensity of service to ensure the Terry White's safety. The Terry White's presenting symptoms, physical exam findings, and initial radiographic and laboratory data in the context of their chronic comorbidities is felt to place them at high risk for further clinical deterioration. Furthermore, it is not anticipated that the Terry White will be medically stable for discharge from the hospital within 2 midnights of admission.   * I certify that at  the point of admission it is my clinical judgment that the Terry White will require inpatient hospital care spanning beyond 2 midnights from the point of admission due to high intensity of service, high risk for further deterioration and high frequency of surveillance required.*   For questions or updates, please contact [facility] Please consult [email or web address] for contact info  under     Signed, Marcelino Duster, PA  12/16/2022 10:30 AM    I have personally seen and examined this Terry White. I agree with the assessment and plan as outlined above. Pt presenting with chest pain and anterior ST elevation on EKG. Plan emergent cardiac cath. Further plans to follow.   Verne Carrow, MD, [facility] 12/16/2022 4:40 PM

## 2022-12-16 NOTE — Progress Notes (Signed)
  Echocardiogram 2D Echocardiogram has been performed.  Terry White 12/16/2022, 2:14 PM

## 2022-12-16 NOTE — ED Notes (Signed)
EMS at bedside

## 2022-12-16 NOTE — ED Triage Notes (Signed)
Pt presents to ED with complaints of chest pain started yesterday, pt states he was nauseated, dizzy, difficulty breathing, diaphoretic, denies numbness, states lasted 1 hour, unable to describe pain. States pain started again this am around 0900

## 2022-12-16 NOTE — Progress Notes (Signed)
ANTICOAGULATION CONSULT NOTE - Initial Consult  Pharmacy Consult for heparin Indication: chest pain/ACS STEMI  No Known Allergies  Patient Measurements: Height: 5\' 10"  (177.8 cm) Weight: 72.6 kg (160 lb) IBW/kg (Calculated) : 73 Heparin Dosing Weight: 72  Vital Signs: BP: 150/122 (09/12 0949) Pulse Rate: 111 (09/12 0950)  Labs: Recent Labs    12/16/22 0954  HGB 13.9  HCT 41.9  PLT 317    CrCl cannot be calculated (No successful lab value found.).   Medical History: Past Medical History:  Diagnosis Date   Gout    HTN (hypertension)     Medications:  (Not in a hospital admission)   Assessment: Pharmacy consulted to dose heparin in patient with chest pain/STEMI.  Patient is not on anticoagulation prior to admission.  CBC WNL  Goal of Therapy:  Heparin level 0.3-0.7 units/ml Monitor platelets by anticoagulation protocol: Yes   Plan:  Give 4000 units bolus x 1 Start heparin infusion at 850 units/hr Check anti-Xa level in 8 hours and daily while on heparin Continue to monitor H&H and platelets  Judeth Cornfield, PharmD Clinical Pharmacist 12/16/2022 10:24 AM

## 2022-12-16 NOTE — ED Notes (Signed)
Pt belongings (clothing, shoes and belt) as well as  personal cane labeled and placed at bedside with pt son.

## 2022-12-16 NOTE — Progress Notes (Signed)
Pre-CABG testing has been completed. Preliminary results can be found in CV Proc through chart review.   12/16/22 3:53 PM Olen Cordial RVT

## 2022-12-16 NOTE — Progress Notes (Signed)
   12/16/22 1623  Spiritual Encounters  Type of Visit Initial  Care provided to: Pt and family  Conversation partners present during encounter Nurse  Reason for visit Routine spiritual support  OnCall Visit No   Chaplain responded to a Spiritual Consult for Major Life Transition.  Pt came to ED with chest pain this morning and will end up have open heart bypass surgery tomorrow morning.  He is understandably nervous about the procedure, but maintaining an overall all upbeat attitude.  He appears to be receiving strong support from his son and daughter who are at bedside.  Pt's daughter referenced the Pt's faith in our conversation and asked that we pray prior to my leaving.  Chaplain did offer prayer with family, and will check in tomorrow after the procedure to see how things go.  Chaplain services remain available by Spiritual Consult or for emergent cases, paging [phone]  Chaplain Raelene Bott, MDiv [email or web address] [phone]

## 2022-12-16 NOTE — ED Provider Notes (Signed)
[facility] AT [facility] Provider Note   CSN: [id number] Arrival date & time: 12/16/22  0102     History  Chief Complaint  Patient presents with   Chest Pain    Taeshawn Collett is a 82 y.o. male.  He has a history of hypertension and diabetes.  He said yesterday he experienced a severe bout of chest pain substernal associated with nausea and diaphoresis, shortness of breath.  He said it resolved after about an hour but recurred again this morning 9am.  He is presenting with severe chest pain.  He denies any prior history of cardiac disease.  He said he took some ibuprofen today to help with his symptoms.  The history is provided by the patient.  Chest Pain Pain location:  Substernal area Pain quality: aching   Pain severity:  Severe Onset quality:  Sudden Duration:  1 hour Timing:  Constant Progression:  Unchanged Chronicity:  Recurrent Relieved by:  Nothing Worsened by:  Nothing Associated symptoms: diaphoresis and nausea   Associated symptoms: no abdominal pain, no cough and no vomiting   Risk factors: hypertension and male sex        Home Medications Prior to Admission medications   Medication Sig Start Date End Date Taking? Authorizing Provider  allopurinol (ZYLOPRIM) 300 MG tablet Take 300 mg by mouth every evening.     [provider]  IRON PO Take 1 tablet by mouth every evening.    [provider]  lisinopril (PRINIVIL,ZESTRIL) 40 MG tablet Take 40 mg by mouth every evening.    [provider]  Omega-3 Fatty Acids (FISH OIL PO) Take 1 capsule by mouth every evening.    [provider]      Allergies    Patient has no known allergies.    Review of Systems   Review of Systems  Constitutional:  Positive for diaphoresis.  Respiratory:  Negative for cough.   Cardiovascular:  Positive for chest pain.  Gastrointestinal:  Positive for nausea. Negative for abdominal pain and vomiting.    Physical  Exam Updated Vital Signs BP (!) 150/122   Pulse (!) 111   Resp (!) 24   Ht 5\' 10"  (1.778 m)   Wt 72.6 kg   SpO2 95%   BMI 22.96 kg/m  Physical Exam Vitals and nursing note reviewed.  Constitutional:      General: He is not in acute distress.    Appearance: He is well-developed.  HENT:     Head: Normocephalic and atraumatic.  Eyes:     Conjunctiva/sclera: Conjunctivae normal.  Cardiovascular:     Rate and Rhythm: Normal rate and regular rhythm.     Heart sounds: Normal heart sounds. No murmur heard. Pulmonary:     Effort: Pulmonary effort is normal. No respiratory distress.     Breath sounds: Normal breath sounds.  Abdominal:     Palpations: Abdomen is soft.     Tenderness: There is no abdominal tenderness.  Musculoskeletal:        General: No swelling. Normal range of motion.     Cervical back: Neck supple.     Right lower leg: No tenderness. No edema.     Left lower leg: No tenderness. No edema.  Skin:    General: Skin is warm and dry.     Capillary Refill: Capillary refill takes less than 2 seconds.  Neurological:     General: No focal deficit present.     Mental Status: He  is alert.     ED Results / Procedures / Treatments   Labs (all labs ordered are listed, but only abnormal results are displayed) Labs Reviewed  CBC WITH DIFFERENTIAL/PLATELET - Abnormal; Notable for the following components:      Result Value   WBC 15.4 (*)    Neutro Abs 11.4 (*)    Abs Immature Granulocytes 0.08 (*)    All other components within normal limits  COMPREHENSIVE METABOLIC PANEL - Abnormal; Notable for the following components:   Potassium 3.0 (*)    Glucose, Bld 250 (*)    Alkaline Phosphatase 169 (*)    All other components within normal limits  LIPID PANEL - Abnormal; Notable for the following components:   Cholesterol 203 (*)    HDL 39 (*)    LDL Cholesterol 139 (*)    All other components within normal limits  LACTIC ACID, PLASMA - Abnormal; Notable for the  following components:   Lactic Acid, Venous 2.5 (*)    All other components within normal limits  CG4 I-STAT (LACTIC ACID) - Abnormal; Notable for the following components:   Lactic Acid, Venous 2.4 (*)    All other components within normal limits  TROPONIN I (HIGH SENSITIVITY) - Abnormal; Notable for the following components:   Troponin I (High Sensitivity) 2,608 (*)    All other components within normal limits  TROPONIN I (HIGH SENSITIVITY) - Abnormal; Notable for the following components:   Troponin I (High Sensitivity) 5,137 (*)    All other components within normal limits  MRSA NEXT GEN BY PCR, NASAL  SURGICAL PCR SCREEN  PROTIME-INR  APTT  HEMOGLOBIN A1C  POCT ACTIVATED CLOTTING TIME    EKG EKG Interpretation Date/Time:  Thursday December 16 2022 09:42:50 EDT Ventricular Rate:  109 PR Interval:  160 QRS Duration:  78 QT Interval:  314 QTC Calculation: 422 R Axis:   84  Text Interpretation: Sinus tachycardia Anteroseptal infarct , possibly acute ** ** ACUTE MI / STEMI ** ** Abnormal ECG No previous ECGs available Confirmed by Meridee Score [phone]) on 12/16/2022 9:52:12 AM  Radiology VAS US DOPPLER PRE CABG  Result Date: 12/16/2022 PREOPERATIVE VASCULAR EVALUATION Patient Name:  BERNABE KINYON  Date of Exam:   12/16/2022 Medical Rec #: [id number]       Accession #:    [id number] Date of Birth: 1940/10/03       Patient Gender: M Patient Age:   7 years Exam Location:  [facility] Procedure:      VAS US DOPPLER PRE CABG Referring Phys: Eugenio Hoes --------------------------------------------------------------------------------  Indications:      Pre-CABG. Risk Factors:     None. Limitations:      Restricted right arm, TR band Comparison Study: No prior studies. Performing Technologist: Olen Cordial RVT  Examination Guidelines: A complete evaluation includes B-mode imaging, spectral Doppler, color Doppler, and power Doppler as needed of all accessible portions of each  vessel. Bilateral testing is considered an integral part of a complete examination. Limited examinations for reoccurring indications may be performed as noted.  Right Carotid Findings: +----------+--------+--------+--------+-----------------------+--------+           PSV cm/sEDV cm/sStenosisDescribe               Comments +----------+--------+--------+--------+-----------------------+--------+ CCA Prox  88      10              smooth and heterogenoustortuous +----------+--------+--------+--------+-----------------------+--------+ CCA Distal67      12  smooth and heterogenous         +----------+--------+--------+--------+-----------------------+--------+ ICA Prox  45      11              smooth and heterogenous         +----------+--------+--------+--------+-----------------------+--------+ ICA Mid   50      13                                     tortuous +----------+--------+--------+--------+-----------------------+--------+ ICA Distal47      13                                     tortuous +----------+--------+--------+--------+-----------------------+--------+ ECA       96      0                                               +----------+--------+--------+--------+-----------------------+--------+ +----------+--------+-------+--------+------------+           PSV cm/sEDV cmsDescribeArm Pressure +----------+--------+-------+--------+------------+ Subclavian88                                  +----------+--------+-------+--------+------------+ +---------+--------+--+--------+-+---------+ VertebralPSV cm/s32EDV cm/s6Antegrade +---------+--------+--+--------+-+---------+ Left Carotid Findings: +----------+--------+--------+--------+-----------------------+--------+           PSV cm/sEDV cm/sStenosisDescribe               Comments +----------+--------+--------+--------+-----------------------+--------+ CCA Prox  88      9                smooth and heterogenous         +----------+--------+--------+--------+-----------------------+--------+ CCA Distal47      7               smooth and heterogenous         +----------+--------+--------+--------+-----------------------+--------+ ICA Prox  30      9               smooth and heterogenous         +----------+--------+--------+--------+-----------------------+--------+ ICA Mid   46      13                                     tortuous +----------+--------+--------+--------+-----------------------+--------+ ICA Distal53      15                                     tortuous +----------+--------+--------+--------+-----------------------+--------+ ECA       85      1                                               +----------+--------+--------+--------+-----------------------+--------+  +----------+--------+--------+--------+------------+ SubclavianPSV cm/sEDV cm/sDescribeArm Pressure +----------+--------+--------+--------+------------+           127                                  +----------+--------+--------+--------+------------+ +---------+--------+--+--------+-+---------+  VertebralPSV cm/s41EDV cm/s5Antegrade +---------+--------+--+--------+-+---------+  ABI Findings: +--------+------------------+-----+-----------+------------------+ Right   Rt Pressure (mmHg)IndexWaveform   Comment            +--------+------------------+-----+-----------+------------------+ Brachial                                  Restricted/TR band +--------+------------------+-----+-----------+------------------+ PTA     129               1.14 multiphasic                   +--------+------------------+-----+-----------+------------------+ DP      114               1.01 monophasic                    +--------+------------------+-----+-----------+------------------+ +--------+------------------+-----+-----------+-------+ Left    Lt Pressure  (mmHg)IndexWaveform   Comment +--------+------------------+-----+-----------+-------+ [id number]                    triphasic          +--------+------------------+-----+-----------+-------+ PTA     124               1.10 multiphasic        +--------+------------------+-----+-----------+-------+ DP      110               0.97 monophasic         +--------+------------------+-----+-----------+-------+ +-------+---------------+----------------+ ABI/TBIToday's ABI/TBIPrevious ABI/TBI +-------+---------------+----------------+ Right  1.14                            +-------+---------------+----------------+ Left   1.1                             +-------+---------------+----------------+  Right Doppler Findings: +--------+--------+-----+-------+------------------+ Site    PressureIndexDopplerComments           +--------+--------+-----+-------+------------------+ Brachial                    Restricted/TR band +--------+--------+-----+-------+------------------+  Left Doppler Findings: +--------+--------+-----+---------+--------+ Site    PressureIndexDoppler  Comments +--------+--------+-----+---------+--------+ [id number]          triphasic         +--------+--------+-----+---------+--------+ Radial               triphasic         +--------+--------+-----+---------+--------+ Ulnar                triphasic         +--------+--------+-----+---------+--------+   Summary: Right Carotid: Velocities in the right ICA are consistent with a 1-39% stenosis. Left Carotid: Velocities in the left ICA are consistent with a 1-39% stenosis. Vertebrals: Bilateral vertebral arteries demonstrate antegrade flow. Right ABI: Resting right ankle-brachial index is within normal range. Left ABI: Resting left ankle-brachial index is within normal range. Left Upper Extremity: Doppler waveform obliterate with left radial compression. Doppler waveforms decrease >50% with left  ulnar compression.    Preliminary    ECHOCARDIOGRAM COMPLETE  Result Date: 12/16/2022    ECHOCARDIOGRAM REPORT   Patient Name:   Eartha Inch Date of Exam: 12/16/2022 Medical Rec #:  [id number]      Height:       70.0 in Accession #:    [id number]     Weight:       160.0 lb Date of Birth:  1940-08-14      BSA:          1.898 m Patient Age:    81 years       BP:           97/63 mmHg Patient Gender: M              HR:           82 bpm. Exam Location:  Inpatient Procedure: 2D Echo, Cardiac Doppler, Color Doppler and Intracardiac            Opacification Agent Indications:    122-I22.9 Subsequent ST elevation (STEM) and non-ST elevation                 (NSTEMI) myocardial infarction  History:        Patient has no prior history of Echocardiogram examinations.                 Acute MI; Risk Factors:Hypertension.  Sonographer:    Sheralyn Boatman RDCS Referring Phys: Kathleene Hazel IMPRESSIONS  1. Left ventricular ejection fraction, by estimation, is 25 to 30%. The left ventricle has severely decreased function. The left ventricle demonstrates regional wall motion abnormalities with mid to apical anteroseptal/inferoseptal akinesis, apical anterior/inferior/lateral akinesis, and akinesis of the true apex. No LV thrombus noted. There is mild concentric left ventricular hypertrophy. Left ventricular diastolic parameters are consistent with Grade I diastolic dysfunction (impaired relaxation).  2. Right ventricular systolic function is normal. The right ventricular size is normal. There is normal pulmonary artery systolic pressure. The estimated right ventricular systolic pressure is 21.5 mmHg.  3. The mitral valve is degenerative. Mild mitral valve regurgitation. No evidence of mitral stenosis.  4. The aortic valve is tricuspid. There is mild calcification of the aortic valve. Aortic valve regurgitation is not visualized. Aortic valve sclerosis is present, with no evidence of aortic valve stenosis.  5. The inferior vena  cava is normal in size with <50% respiratory variability, suggesting right atrial pressure of 8 mmHg. Comparison(s): No prior Echocardiogram. FINDINGS  Left Ventricle: Left ventricular ejection fraction, by estimation, is 25 to 30%. The left ventricle has severely decreased function. The left ventricle demonstrates regional wall motion abnormalities. Definity contrast agent was given IV to delineate the left ventricular endocardial borders. The left ventricular internal cavity size was normal in size. There is mild concentric left ventricular hypertrophy. Left ventricular diastolic parameters are consistent with Grade I diastolic dysfunction (impaired relaxation). Right Ventricle: The right ventricular size is normal. No increase in right ventricular wall thickness. Right ventricular systolic function is normal. There is normal pulmonary artery systolic pressure. The tricuspid regurgitant velocity is 1.84 m/s, and  with an assumed right atrial pressure of 8 mmHg, the estimated right ventricular systolic pressure is 21.5 mmHg. Left Atrium: Left atrial size was normal in size. Right Atrium: Right atrial size was normal in size. Pericardium: There is no evidence of pericardial effusion. Mitral Valve: The mitral valve is degenerative in appearance. Mild to moderate mitral annular calcification. Mild mitral valve regurgitation. No evidence of mitral valve stenosis. Tricuspid Valve: The tricuspid valve is grossly normal. Tricuspid valve regurgitation is mild . No evidence of tricuspid stenosis. Aortic Valve: The aortic valve is tricuspid. There is mild calcification of the aortic valve. Aortic valve regurgitation is not visualized. Aortic valve sclerosis is present, with no evidence of aortic valve stenosis. Pulmonic Valve: The pulmonic valve was normal in structure. Pulmonic valve regurgitation is not visualized. No evidence of  pulmonic stenosis. Aorta: The aortic root and ascending aorta are structurally normal, with no  evidence of dilitation. Venous: The inferior vena cava is normal in size with less than 50% respiratory variability, suggesting right atrial pressure of 8 mmHg. IAS/Shunts: No atrial level shunt detected by color flow Doppler.  LEFT VENTRICLE PLAX 2D LVIDd:         3.90 cm      Diastology LVIDs:         3.40 cm      LV e' medial:    4.68 cm/s LV PW:         1.10 cm      LV E/e' medial:  12.1 LV IVS:        1.40 cm      LV e' lateral:   4.79 cm/s LVOT diam:     2.20 cm      LV E/e' lateral: 11.8 LV SV:         60 LV SV Index:   32 LVOT Area:     3.80 cm  LV Volumes (MOD) LV vol d, MOD A2C: 100.0 ml LV vol d, MOD A4C: 91.8 ml LV vol s, MOD A2C: 53.1 ml LV vol s, MOD A4C: 69.8 ml LV SV MOD A2C:     46.9 ml LV SV MOD A4C:     91.8 ml LV SV MOD BP:      38.3 ml RIGHT VENTRICLE             IVC RV S prime:     11.60 cm/s  IVC diam: 1.80 cm TAPSE (M-mode): 2.2 cm LEFT ATRIUM             Index        RIGHT ATRIUM           Index LA diam:        2.60 cm 1.37 cm/m   RA Area:     10.70 cm LA Vol (A2C):   27.0 ml 14.22 ml/m  RA Volume:   23.20 ml  12.22 ml/m LA Vol (A4C):   37.4 ml 19.70 ml/m LA Biplane Vol: 32.2 ml 16.96 ml/m  AORTIC VALVE LVOT Vmax:   95.90 cm/s LVOT Vmean:  59.900 cm/s LVOT VTI:    0.159 m  AORTA Ao Root diam: 3.40 cm Ao Asc diam:  3.50 cm MITRAL VALVE                TRICUSPID VALVE MV Area (PHT): 4.31 cm     TR Peak grad:   13.5 mmHg MV Decel Time: 176 msec     TR Vmax:        184.00 cm/s MV E velocity: 56.70 cm/s MV A velocity: 107.00 cm/s  SHUNTS MV E/A ratio:  0.53         Systemic VTI:  0.16 m                             Systemic Diam: 2.20 cm Dalton McleanMD Electronically signed by Wilfred Lacy Signature Date/Time: 12/16/2022/2:17:53 PM    Final    CARDIAC CATHETERIZATION  Result Date: 12/16/2022   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   Prox RCA lesion is 100% stenosed.   Mid Cx to Dist Cx lesion is 100% stenosed.   Mid LAD-1 lesion is 99% stenosed.   Mid LAD-2 lesion is 99%  stenosed.   Balloon angioplasty was performed  using a BALLN EMERGE MR 2.0X15.   Balloon angioplasty was performed using a BALLN [place] EMERGE MR 2.5X15.   Post intervention, there is a 90% residual stenosis.   Post intervention, there is a 99% residual stenosis. Acute anterior STEMI The LAD is a large caliber vessel that courses to the apex. The mid LAD has serial 99% heavily calcified stenoses. The distal LAD has mild non-obstructive disease The Circumflex has 100% chronic occlusion in the mid AV groove segment. The distal AV groove Circumflex fills from left to left collaterals. The large dominant RCA has 100% chronic proximal occlusion. The distal RCA fills from right to right and left to right collaterals. Balloon angioplasty of the mid LAD with inability to full expand the balloon due to heavy calcification. TIMI-3 flow at the conclusion of the case. The patient was chest pain free. Recommendations: Will admit to ICU. I was unable to fully expand the lesions in the mid LAD but flow down the LAD was improved with balloon angioplasty and with medical therapy. 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 Will ask CT surgery to see him to discuss bypass surgery. Echo later today. Continue Aggrastat infusion for now. Will continue ASA and start a high intensity statin. (No oral anti-platelet agent was given).   DG Chest Port 1 View  Result Date: 12/16/2022 CLINICAL DATA:  Chest pain EXAM: PORTABLE CHEST 1 VIEW COMPARISON:  CT 01/11/2018 FINDINGS: Overlapping cardiac leads. Defibrillator pads. Normal cardiopericardial silhouette. Hyperinflation. Diffuse interstitial changes, likely chronic. There is a nodular opacity along the right upper lung with a apical pleural thickening which is seen on the prior CT scan of 2019. Slight blunting of the costophrenic angle on the right. Tiny effusion versus thickening. No new  consolidation. No pneumothorax. Film is under penetrated. IMPRESSION: Hyperinflation with the extensive chronic changes including some nodular opacity in the right lung apex with pleural thickening. Tiny right effusion versus pleural thickening Electronically Signed   By: Karen Kays M.D.   On: 12/16/2022 10:50    Procedures .Critical Care  Performed by: Terrilee Files, MD Authorized by: Terrilee Files, MD   Critical care provider statement:    Critical care time (minutes):  45   Critical care time was exclusive of:  Separately billable procedures and treating other patients   Critical care was necessary to treat or prevent imminent or life-threatening deterioration of the following conditions:  Cardiac failure   Critical care was time spent personally by me on the following activities:  Development of treatment plan with patient or surrogate, discussions with consultants, evaluation of patient's response to treatment, examination of patient, obtaining history from patient or surrogate, ordering and performing treatments and interventions, ordering and review of laboratory studies, ordering and review of radiographic studies, pulse oximetry, re-evaluation of patient's condition and review of old charts   I assumed direction of critical care for this patient from another provider in my specialty: no       Medications Ordered in ED Medications  0.9 %  sodium chloride infusion ( Intravenous New Bag/Given 12/16/22 1005)  nitroGLYCERIN (NITROGLYN) 2 % ointment 1 inch (has no administration in time range)  aspirin chewable tablet 324 mg (324 mg Oral Given 12/16/22 1002)    ED Course/ Medical Decision Making/ A&P Clinical Course as of 12/16/22 1007  Thu Dec 16, 2022  0959 Activated code STEMI after was given his EKG.  Have ordered aspirin and nitro heparin.  Awaiting cardiology callback. [MB]  1005 Discussed with cards master Trish.  She is excepting for Dr.Thukanni.  Patient will be taken  emergent transport to [facility] catheterization lab. [MB]    Clinical Course User Index [MB] Terrilee Files, MD                                 Medical Decision Making Amount and/or Complexity of Data Reviewed Labs: ordered. Radiology: ordered.  Risk OTC drugs. Prescription drug management.   This patient complains of chest pain shortness of breath diaphoresis; this involves an extensive number of treatment Options and is a complaint that carries with it a high risk of complications and morbidity. The differential includes ACS, pneumonia, pneumothorax, PE, arrhythmia  I ordered, reviewed and interpreted labs, which included CBC elevated white count stable hemoglobin, chemistries with low potassium elevated glucose, troponins elevated I ordered medication aspirin heparin nitroglycerin ointment and reviewed PMP when indicated. I ordered imaging studies which included chest x-ray and I independently    visualized and interpreted imaging which showed hyperinflation no definite infiltrate Additional history obtained from patient's companion Previous records obtained and reviewed in epic no recent admissions I consulted cardiology and discussed lab and imaging findings and discussed disposition.  Cardiac monitoring reviewed, sinus rhythm Social determinants considered, no significant barriers Critical Interventions: Initiation of code STEMI and discussion with cardiology team and arrangement for emergent transport  After the interventions stated above, I reevaluated the patient and found patient's pain to be improved but not resolved Admission and further testing considered, patient will be transferred to [facility] to the Cath Lab for further evaluation by cardiology         Final Clinical Impression(s) / ED Diagnoses Final diagnoses:  ST elevation myocardial infarction (STEMI), unspecified artery [facility])    Rx / DC Orders ED Discharge Orders     None         Terrilee Files, MD 12/16/22 1627

## 2022-12-17 ENCOUNTER — Encounter (HOSPITAL_COMMUNITY): Payer: Self-pay | Admitting: Cardiovascular Disease

## 2022-12-17 DIAGNOSIS — I5021 Acute systolic (congestive) heart failure: Secondary | ICD-10-CM | POA: Diagnosis not present

## 2022-12-17 DIAGNOSIS — I2109 ST elevation (STEMI) myocardial infarction involving other coronary artery of anterior wall: Secondary | ICD-10-CM | POA: Diagnosis not present

## 2022-12-17 LAB — BASIC METABOLIC PANEL
Anion gap: 9 (ref 5–15)
BUN: 14 mg/dL (ref 8–23)
CO2: 24 mmol/L (ref 22–32)
Calcium: 8.8 mg/dL — ABNORMAL LOW (ref 8.9–10.3)
Chloride: 102 mmol/L (ref 98–111)
Creatinine, Ser: 0.86 mg/dL (ref 0.61–1.24)
GFR, Estimated: >60 mL/min (ref >60)
Glucose, Bld: 198 mg/dL — ABNORMAL HIGH (ref 70–99)
Potassium: 3.9 mmol/L (ref 3.5–5.1)
Sodium: 135 mmol/L (ref 135–145)

## 2022-12-17 LAB — LACTIC ACID, PLASMA
Lactic Acid, Venous: 3 mmol/L (ref 0.5–1.9)
Lactic Acid, Venous: 2.4 mmol/L (ref 0.5–1.9)

## 2022-12-17 LAB — HEPATIC FUNCTION PANEL
ALT: 18 U/L (ref 0–44)
AST: 49 U/L — ABNORMAL HIGH (ref 15–41)
Albumin: 3.4 g/dL — ABNORMAL LOW (ref 3.5–5.0)
Alkaline Phosphatase: 158 U/L — ABNORMAL HIGH (ref 38–126)
Bilirubin, Direct: <0.1 mg/dL (ref 0.0–0.2)
Total Bilirubin: 0.6 mg/dL (ref 0.3–1.2)
Total Protein: 8.5 g/dL — ABNORMAL HIGH (ref 6.5–8.1)

## 2022-12-17 LAB — CBC
HCT: 37.1 % — ABNORMAL LOW (ref 39.0–52.0)
Hemoglobin: 12.1 g/dL — ABNORMAL LOW (ref 13.0–17.0)
MCH: 30.2 pg (ref 26.0–34.0)
MCHC: 32.6 g/dL (ref 30.0–36.0)
MCV: 92.5 fL (ref 80.0–100.0)
Platelets: 324 10*3/uL (ref 150–400)
RBC: 4.01 MIL/uL — ABNORMAL LOW (ref 4.22–5.81)
RDW: 14.4 % (ref 11.5–15.5)
WBC: 11.1 10*3/uL — ABNORMAL HIGH (ref 4.0–10.5)
nRBC: 0 % (ref 0.0–0.2)

## 2022-12-17 LAB — GLUCOSE, CAPILLARY
Glucose-Capillary: 240 mg/dL — ABNORMAL HIGH (ref 70–99)
Glucose-Capillary: 167 mg/dL — ABNORMAL HIGH (ref 70–99)

## 2022-12-17 LAB — HEPARIN LEVEL (UNFRACTIONATED): Heparin Unfractionated: <0.1 [IU]/mL — ABNORMAL LOW (ref 0.30–0.70)

## 2022-12-17 LAB — HEMOGLOBIN A1C
Hgb A1c MFr Bld: 9.1 % — ABNORMAL HIGH (ref 4.8–5.6)
Mean Plasma Glucose: 214 mg/dL

## 2022-12-17 MED ORDER — HEPARIN (PORCINE) 25000 UT/250ML-% IV SOLN
1500.0000 [IU]/h | INTRAVENOUS | Status: DC
  Administered 2022-12-17: 900 [IU]/h via INTRAVENOUS
  Administered 2022-12-18: 1300 [IU]/h via INTRAVENOUS
  Administered 2022-12-19 – 2022-12-22 (×5): 1500 [IU]/h via INTRAVENOUS
  Filled 2022-12-17 (×7): qty 250

## 2022-12-17 MED ORDER — FUROSEMIDE 10 MG/ML IJ SOLN
60.0000 mg | Freq: Once | INTRAMUSCULAR | Status: AC
  Administered 2022-12-17: 60 mg via INTRAVENOUS
  Filled 2022-12-17: qty 6

## 2022-12-17 MED ORDER — DIGOXIN 125 MCG PO TABS
0.1250 mg | ORAL_TABLET | Freq: Every day | ORAL | Status: DC
  Administered 2022-12-17 – 2022-12-21 (×5): 0.125 mg via ORAL
  Filled 2022-12-17 (×5): qty 1

## 2022-12-17 MED ORDER — INSULIN ASPART 100 UNIT/ML IJ SOLN
0.0000 [IU] | Freq: Three times a day (TID) | INTRAMUSCULAR | Status: DC
  Administered 2022-12-17: 3 [IU] via SUBCUTANEOUS
  Administered 2022-12-18: 2 [IU] via SUBCUTANEOUS
  Administered 2022-12-18: 1 [IU] via SUBCUTANEOUS
  Administered 2022-12-18 – 2022-12-19 (×4): 2 [IU] via SUBCUTANEOUS
  Administered 2022-12-20: 1 [IU] via SUBCUTANEOUS
  Administered 2022-12-20 (×2): 2 [IU] via SUBCUTANEOUS
  Administered 2022-12-21 (×3): 1 [IU] via SUBCUTANEOUS

## 2022-12-17 MED ORDER — POTASSIUM CHLORIDE CRYS ER 20 MEQ PO TBCR: 40.0000 meq | EXTENDED_RELEASE_TABLET | Freq: Once | ORAL | Status: DC

## 2022-12-17 MED ORDER — INSULIN ASPART 100 UNIT/ML IJ SOLN: 0.0000 [IU] | Freq: Every day | INTRAMUSCULAR | Status: DC

## 2022-12-17 NOTE — Progress Notes (Signed)
Heart Failure Navigator Progress Note  Assessed for Heart & Vascular TOC clinic readiness.  Patient does not meet criteria due to Advanced Heart Failure consult.   Navigator will sign off at this time.   Price Lachapelle,RN, BSN,MSN Heart Failure Nurse Navigator. Contact by secure chat only.

## 2022-12-17 NOTE — Progress Notes (Signed)
Latic acid still pending from 1700, STAT lab ordered now. Please call back to cardiology team for result. Informed nurse.

## 2022-12-17 NOTE — Progress Notes (Signed)
Discussed with pt IS (1500 ml currently), sternal precautions, mobility, and d/c planning. Pt somewhat receptive, easily distracted by tangential comments at times. He voices frustration with cables and IV meds that make him have frequent urination. Discussed with pt the potential to ambulate at a later date if he remains stable. Family arrived and request CM to hear options for d/c and also Chaplain for Advanced Directives. Notified RN.  [id number] Ethelda Chick BS, ACSM-CEP 12/17/2022 3:25 PM

## 2022-12-17 NOTE — Progress Notes (Signed)
ANTICOAGULATION CONSULT NOTE -   Pharmacy Consult for heparin Indication: chest pain/ACS  No Known Allergies  Patient Measurements: Height: 5\' 10"  (177.8 cm) Weight: 72.6 kg (160 lb) IBW/kg (Calculated) : 73 Heparin Dosing Weight: 72.6 kg  Vital Signs: Temp: 97.8 F (36.6 C) (09/13 1104) Temp Source: Oral (09/13 1104) BP: 105/74 (09/13 2000) Pulse Rate: 119 (09/13 2000)  Labs: Recent Labs    12/16/22 0954 12/16/22 1329 12/17/22 0258 12/17/22 1956  HGB 13.9  --  12.1*  --   HCT 41.9  --  37.1*  --   PLT 317  --  324  --   APTT 31  --   --   --   LABPROT 12.8  --   --   --   INR 0.9  --   --   --   HEPARINUNFRC  --   --   --  <0.10*  CREATININE 0.87  --  0.86  --   TROPONINIHS 2,608* 5,137*  --   --     Estimated Creatinine Clearance: 71.5 mL/min (by C-G formula based on SCr of 0.86 mg/dL).   Medical History: Past Medical History:  Diagnosis Date   Gout    HTN (hypertension)     Medications:  Scheduled:   allopurinol  300 mg Oral QPM   aspirin  81 mg Oral Daily   atorvastatin  80 mg Oral Daily   Chlorhexidine Gluconate Cloth  6 each Topical Daily   digoxin  0.125 mg Oral Daily   insulin aspart  0-5 Units Subcutaneous QHS   insulin aspart  0-9 Units Subcutaneous TID WC   mupirocin ointment  1 Application Nasal BID   nitroGLYCERIN  1 inch Topical Once   potassium chloride  60 mEq Oral Daily   sodium chloride flush  3 mL Intravenous Q12H   Infusions:   sodium chloride Stopped (12/16/22 1010)   sodium chloride 10 mL/hr at 12/17/22 1900   heparin 900 Units/hr (12/17/22 1900)    Assessment: 82 yo M presented from [facility] for LHC in the setting of STEMI. Patient started on Aggrastat during cath procedure, unable to open culprit lesion with PCI, plan for eventual CABG after stabilization of LV function. Not on anticoagulation prior to admission.  Hgb (12.1) and PTS (317) are stable, no signs of bleeding while on Aggrastat. Will plan to start heparin  with no bolus given administration of Aggrastat.  Initial heparin level this evening is undetectable.  No known issues with IV infusion per RN.  No overt bleeding or complications noted.  Goal of Therapy:  Heparin level 0.3-0.7 units/ml Monitor platelets by anticoagulation protocol: Yes   Plan:  Increase IV heparin to 1100 units/hr Check 8 hour heparin level Monitor daily heparin level and CBC Monitor for signs/symptoms of bleeding  Jenetta Downer, BCCP Clinical Pharmacist  12/17/2022 8:48 PM   [facility] pharmacy phone numbers are listed on [email or web address]

## 2022-12-17 NOTE — Consult Note (Signed)
triphasic         +--------+--------+-----+---------+--------+   Summary: Right Carotid: Velocities in the right ICA are consistent with a 1-39% stenosis. Left Carotid: Velocities in the left ICA are consistent with a 1-39% stenosis. Vertebrals: Bilateral vertebral arteries demonstrate antegrade flow. Right ABI: Resting right ankle-brachial index is within normal range. Left ABI: Resting left ankle-brachial index is within normal range. Left Upper Extremity: Doppler waveform obliterate with left radial compression. Doppler waveforms decrease >50% with left ulnar compression.  Electronically signed by Heath Lark on 12/16/2022 at 10:52:28 PM.    Final    ECHOCARDIOGRAM COMPLETE  Result Date: 12/16/2022    ECHOCARDIOGRAM REPORT   Patient Name:   Terry White Date of Exam: 12/16/2022 Medical Rec #:  [id number]      Height:       70.0 in Accession #:    [id number]     Weight:       160.0 lb Date of Birth:  12/19/1940      BSA:          1.898 m Patient Age:    82 years       BP:           97/63 mmHg Patient Gender: M              HR:           82 bpm. Exam Location:  Inpatient Procedure: 2D Echo, Cardiac Doppler, Color Doppler and Intracardiac            Opacification Agent Indications:    122-I22.9 Subsequent ST elevation (STEM) and non-ST elevation                 (NSTEMI) myocardial infarction  History:        Patient has no prior history of Echocardiogram examinations.                 Acute  MI; Risk Factors:Hypertension.  Sonographer:    Sheralyn Boatman RDCS Referring Phys: Kathleene Hazel IMPRESSIONS  1. Left ventricular ejection fraction, by estimation, is 25 to 30%. The left ventricle has severely decreased function. The left ventricle demonstrates regional wall motion abnormalities with mid to apical anteroseptal/inferoseptal akinesis, apical anterior/inferior/lateral akinesis, and akinesis of the true apex. No LV thrombus noted. There is mild concentric left ventricular hypertrophy. Left ventricular diastolic parameters are consistent with Grade I diastolic dysfunction (impaired relaxation).  2. Right ventricular systolic function is normal. The right ventricular size is normal. There is normal pulmonary artery systolic pressure. The estimated right ventricular systolic pressure is 21.5 mmHg.  3. The mitral valve is degenerative. Mild mitral valve regurgitation. No evidence of mitral stenosis.  4. The aortic valve is tricuspid. There is mild calcification of the aortic valve. Aortic valve regurgitation is not visualized. Aortic valve sclerosis is present, with no evidence of aortic valve stenosis.  5. The inferior vena cava is normal in size with <50% respiratory variability, suggesting right atrial pressure of 8 mmHg. Comparison(s): No prior Echocardiogram. FINDINGS  Left Ventricle: Left ventricular ejection fraction, by estimation, is 25 to 30%. The left ventricle has severely decreased function. The left ventricle demonstrates regional wall motion abnormalities. Definity contrast agent was given IV to delineate the left ventricular endocardial borders. The left ventricular internal cavity size was normal in size. There is mild concentric left ventricular hypertrophy. Left ventricular diastolic parameters are consistent with Grade I diastolic dysfunction (impaired relaxation). Right Ventricle: The right ventricular size is normal. No  file  Other Topics Concern   Not on file  Social History Narrative   Not on file   Social Determinants of Health   Financial Resource Strain: Not on file  Food Insecurity: No Food Insecurity (12/16/2022)   Hunger Vital Sign    Worried About Running Out of Food in the Last Year: Never true    Ran Out of Food in the Last Year: Never true  Transportation Needs: No Transportation Needs (12/16/2022)   PRAPARE - Administrator, Civil Service (Medical): No    Lack of Transportation (Non-Medical):  No  Physical Activity: Not on file  Stress: Not on file  Social Connections: Not on file    Allergies:  No Known Allergies  Objective:    Vital Signs:   Temp:  [97.8 F (36.6 C)-98.5 F (36.9 C)] 97.8 F (36.6 C) (09/13 1104) Pulse Rate:  [0-103] 89 (09/13 1000) Resp:  [14-25] 20 (09/12 2000) BP: (97-135)/(63-91) 113/74 (09/13 1000) SpO2:  [91 %-98 %] 94 % (09/13 1000) Last BM Date : 12/15/22  Weight change: Filed Weights   12/16/22 0941  Weight: 72.6 kg    Intake/Output:   Intake/Output Summary (Last 24 hours) at 12/17/2022 1138 Last data filed at 12/17/2022 1000 Gross per 24 hour  Intake 1679.54 ml  Output --  Net 1679.54 ml      Physical Exam    General:  Well appearing.  HEENT: normal Neck: supple. JVP to mid neck. Carotids 2+ bilat; no bruits.  Cor: PMI nondisplaced. Regular rate & rhythm. No rubs, gallops or murmurs. Lungs: clear Abdomen: soft, nontender, nondistended.  Extremities: no cyanosis, clubbing, rash, edema Neuro: alert & orientedx3. Affect pleasant   Telemetry   SR 80s-90s  EKG    Sinus tachycardia 109 bpm, ST elevation and Q waves in leads V2-V4  Labs   Basic Metabolic Panel: Recent Labs  Lab 12/16/22 0954 12/17/22 0258  NA 135 135  K 3.0* 3.9  CL 98 102  CO2 24 24  GLUCOSE 250* 198*  BUN 13 14  CREATININE 0.87 0.86  CALCIUM 9.1 8.8*    Liver Function Tests: Recent Labs  Lab 12/16/22 0954  AST 33  ALT 15  ALKPHOS 169*  BILITOT 0.7  PROT 7.9  ALBUMIN 3.6   No results for input(s): "LIPASE", "AMYLASE" in the last 168 hours. No results for input(s): "AMMONIA" in the last 168 hours.  CBC: Recent Labs  Lab 12/16/22 0954 12/17/22 0258  WBC 15.4* 11.1*  NEUTROABS 11.4*  --   HGB 13.9 12.1*  HCT 41.9 37.1*  MCV 94.4 92.5  PLT 317 324    Cardiac Enzymes: No results for input(s): "CKTOTAL", "CKMB", "CKMBINDEX", "TROPONINI" in the last 168 hours.  BNP: BNP (last 3 results) No results for input(s): "BNP"  in the last 8760 hours.  ProBNP (last 3 results) No results for input(s): "PROBNP" in the last 8760 hours.   CBG: No results for input(s): "GLUCAP" in the last 168 hours.  Coagulation Studies: Recent Labs    12/16/22 0954  LABPROT 12.8  INR 0.9     Imaging   VAS US DOPPLER PRE CABG  Result Date: 12/16/2022 PREOPERATIVE VASCULAR EVALUATION Patient Name:  Terry White  Date of Exam:   12/16/2022 Medical Rec #: [id number]       Accession #:    [id number] Date of Birth: 06/03/1940       Patient Gender: M Patient Age:   50 years Exam Location:  [facility]  Other Topics Concern   Not on file  Social History Narrative   Not on file   Social Determinants of Health   Financial Resource Strain: Not on file  Food Insecurity: No Food Insecurity (12/16/2022)   Hunger Vital Sign    Worried About Running Out of Food in the Last Year: Never true    Ran Out of Food in the Last Year: Never true  Transportation Needs: No Transportation Needs (12/16/2022)   PRAPARE - Administrator, Civil Service (Medical): No    Lack of Transportation (Non-Medical):  No  Physical Activity: Not on file  Stress: Not on file  Social Connections: Not on file    Allergies:  No Known Allergies  Objective:    Vital Signs:   Temp:  [97.8 F (36.6 C)-98.5 F (36.9 C)] 97.8 F (36.6 C) (09/13 1104) Pulse Rate:  [0-103] 89 (09/13 1000) Resp:  [14-25] 20 (09/12 2000) BP: (97-135)/(63-91) 113/74 (09/13 1000) SpO2:  [91 %-98 %] 94 % (09/13 1000) Last BM Date : 12/15/22  Weight change: Filed Weights   12/16/22 0941  Weight: 72.6 kg    Intake/Output:   Intake/Output Summary (Last 24 hours) at 12/17/2022 1138 Last data filed at 12/17/2022 1000 Gross per 24 hour  Intake 1679.54 ml  Output --  Net 1679.54 ml      Physical Exam    General:  Well appearing.  HEENT: normal Neck: supple. JVP to mid neck. Carotids 2+ bilat; no bruits.  Cor: PMI nondisplaced. Regular rate & rhythm. No rubs, gallops or murmurs. Lungs: clear Abdomen: soft, nontender, nondistended.  Extremities: no cyanosis, clubbing, rash, edema Neuro: alert & orientedx3. Affect pleasant   Telemetry   SR 80s-90s  EKG    Sinus tachycardia 109 bpm, ST elevation and Q waves in leads V2-V4  Labs   Basic Metabolic Panel: Recent Labs  Lab 12/16/22 0954 12/17/22 0258  NA 135 135  K 3.0* 3.9  CL 98 102  CO2 24 24  GLUCOSE 250* 198*  BUN 13 14  CREATININE 0.87 0.86  CALCIUM 9.1 8.8*    Liver Function Tests: Recent Labs  Lab 12/16/22 0954  AST 33  ALT 15  ALKPHOS 169*  BILITOT 0.7  PROT 7.9  ALBUMIN 3.6   No results for input(s): "LIPASE", "AMYLASE" in the last 168 hours. No results for input(s): "AMMONIA" in the last 168 hours.  CBC: Recent Labs  Lab 12/16/22 0954 12/17/22 0258  WBC 15.4* 11.1*  NEUTROABS 11.4*  --   HGB 13.9 12.1*  HCT 41.9 37.1*  MCV 94.4 92.5  PLT 317 324    Cardiac Enzymes: No results for input(s): "CKTOTAL", "CKMB", "CKMBINDEX", "TROPONINI" in the last 168 hours.  BNP: BNP (last 3 results) No results for input(s): "BNP"  in the last 8760 hours.  ProBNP (last 3 results) No results for input(s): "PROBNP" in the last 8760 hours.   CBG: No results for input(s): "GLUCAP" in the last 168 hours.  Coagulation Studies: Recent Labs    12/16/22 0954  LABPROT 12.8  INR 0.9     Imaging   VAS US DOPPLER PRE CABG  Result Date: 12/16/2022 PREOPERATIVE VASCULAR EVALUATION Patient Name:  Terry White  Date of Exam:   12/16/2022 Medical Rec #: [id number]       Accession #:    [id number] Date of Birth: 06/03/1940       Patient Gender: M Patient Age:   50 years Exam Location:  [facility]  Other Topics Concern   Not on file  Social History Narrative   Not on file   Social Determinants of Health   Financial Resource Strain: Not on file  Food Insecurity: No Food Insecurity (12/16/2022)   Hunger Vital Sign    Worried About Running Out of Food in the Last Year: Never true    Ran Out of Food in the Last Year: Never true  Transportation Needs: No Transportation Needs (12/16/2022)   PRAPARE - Administrator, Civil Service (Medical): No    Lack of Transportation (Non-Medical):  No  Physical Activity: Not on file  Stress: Not on file  Social Connections: Not on file    Allergies:  No Known Allergies  Objective:    Vital Signs:   Temp:  [97.8 F (36.6 C)-98.5 F (36.9 C)] 97.8 F (36.6 C) (09/13 1104) Pulse Rate:  [0-103] 89 (09/13 1000) Resp:  [14-25] 20 (09/12 2000) BP: (97-135)/(63-91) 113/74 (09/13 1000) SpO2:  [91 %-98 %] 94 % (09/13 1000) Last BM Date : 12/15/22  Weight change: Filed Weights   12/16/22 0941  Weight: 72.6 kg    Intake/Output:   Intake/Output Summary (Last 24 hours) at 12/17/2022 1138 Last data filed at 12/17/2022 1000 Gross per 24 hour  Intake 1679.54 ml  Output --  Net 1679.54 ml      Physical Exam    General:  Well appearing.  HEENT: normal Neck: supple. JVP to mid neck. Carotids 2+ bilat; no bruits.  Cor: PMI nondisplaced. Regular rate & rhythm. No rubs, gallops or murmurs. Lungs: clear Abdomen: soft, nontender, nondistended.  Extremities: no cyanosis, clubbing, rash, edema Neuro: alert & orientedx3. Affect pleasant   Telemetry   SR 80s-90s  EKG    Sinus tachycardia 109 bpm, ST elevation and Q waves in leads V2-V4  Labs   Basic Metabolic Panel: Recent Labs  Lab 12/16/22 0954 12/17/22 0258  NA 135 135  K 3.0* 3.9  CL 98 102  CO2 24 24  GLUCOSE 250* 198*  BUN 13 14  CREATININE 0.87 0.86  CALCIUM 9.1 8.8*    Liver Function Tests: Recent Labs  Lab 12/16/22 0954  AST 33  ALT 15  ALKPHOS 169*  BILITOT 0.7  PROT 7.9  ALBUMIN 3.6   No results for input(s): "LIPASE", "AMYLASE" in the last 168 hours. No results for input(s): "AMMONIA" in the last 168 hours.  CBC: Recent Labs  Lab 12/16/22 0954 12/17/22 0258  WBC 15.4* 11.1*  NEUTROABS 11.4*  --   HGB 13.9 12.1*  HCT 41.9 37.1*  MCV 94.4 92.5  PLT 317 324    Cardiac Enzymes: No results for input(s): "CKTOTAL", "CKMB", "CKMBINDEX", "TROPONINI" in the last 168 hours.  BNP: BNP (last 3 results) No results for input(s): "BNP"  in the last 8760 hours.  ProBNP (last 3 results) No results for input(s): "PROBNP" in the last 8760 hours.   CBG: No results for input(s): "GLUCAP" in the last 168 hours.  Coagulation Studies: Recent Labs    12/16/22 0954  LABPROT 12.8  INR 0.9     Imaging   VAS US DOPPLER PRE CABG  Result Date: 12/16/2022 PREOPERATIVE VASCULAR EVALUATION Patient Name:  Terry White  Date of Exam:   12/16/2022 Medical Rec #: [id number]       Accession #:    [id number] Date of Birth: 06/03/1940       Patient Gender: M Patient Age:   50 years Exam Location:  [facility]         +--------+--------+-----+---------+--------+   Summary: Right Carotid: Velocities in the right ICA are consistent with a 1-39% stenosis. Left Carotid: Velocities in the left ICA are consistent with a 1-39% stenosis. Vertebrals: Bilateral vertebral arteries demonstrate antegrade flow. Right ABI: Resting right ankle-brachial index is within normal range. Left ABI: Resting left ankle-brachial index is within normal range. Left Upper Extremity: Doppler waveform obliterate with left radial compression. Doppler waveforms decrease >50% with left ulnar compression.  Electronically signed by Heath Lark on 12/16/2022 at 10:52:28 PM.    Final    ECHOCARDIOGRAM COMPLETE  Result Date: 12/16/2022    ECHOCARDIOGRAM REPORT   Patient Name:   Terry White Date of Exam: 12/16/2022 Medical Rec #:  [id number]      Height:       70.0 in Accession #:    [id number]     Weight:       160.0 lb Date of Birth:  12/19/1940      BSA:          1.898 m Patient Age:    82 years       BP:           97/63 mmHg Patient Gender: M              HR:           82 bpm. Exam Location:  Inpatient Procedure: 2D Echo, Cardiac Doppler, Color Doppler and Intracardiac            Opacification Agent Indications:    122-I22.9 Subsequent ST elevation (STEM) and non-ST elevation                 (NSTEMI) myocardial infarction  History:        Patient has no prior history of Echocardiogram examinations.                 Acute  MI; Risk Factors:Hypertension.  Sonographer:    Sheralyn Boatman RDCS Referring Phys: Kathleene Hazel IMPRESSIONS  1. Left ventricular ejection fraction, by estimation, is 25 to 30%. The left ventricle has severely decreased function. The left ventricle demonstrates regional wall motion abnormalities with mid to apical anteroseptal/inferoseptal akinesis, apical anterior/inferior/lateral akinesis, and akinesis of the true apex. No LV thrombus noted. There is mild concentric left ventricular hypertrophy. Left ventricular diastolic parameters are consistent with Grade I diastolic dysfunction (impaired relaxation).  2. Right ventricular systolic function is normal. The right ventricular size is normal. There is normal pulmonary artery systolic pressure. The estimated right ventricular systolic pressure is 21.5 mmHg.  3. The mitral valve is degenerative. Mild mitral valve regurgitation. No evidence of mitral stenosis.  4. The aortic valve is tricuspid. There is mild calcification of the aortic valve. Aortic valve regurgitation is not visualized. Aortic valve sclerosis is present, with no evidence of aortic valve stenosis.  5. The inferior vena cava is normal in size with <50% respiratory variability, suggesting right atrial pressure of 8 mmHg. Comparison(s): No prior Echocardiogram. FINDINGS  Left Ventricle: Left ventricular ejection fraction, by estimation, is 25 to 30%. The left ventricle has severely decreased function. The left ventricle demonstrates regional wall motion abnormalities. Definity contrast agent was given IV to delineate the left ventricular endocardial borders. The left ventricular internal cavity size was normal in size. There is mild concentric left ventricular hypertrophy. Left ventricular diastolic parameters are consistent with Grade I diastolic dysfunction (impaired relaxation). Right Ventricle: The right ventricular size is normal. No  file  Other Topics Concern   Not on file  Social History Narrative   Not on file   Social Determinants of Health   Financial Resource Strain: Not on file  Food Insecurity: No Food Insecurity (12/16/2022)   Hunger Vital Sign    Worried About Running Out of Food in the Last Year: Never true    Ran Out of Food in the Last Year: Never true  Transportation Needs: No Transportation Needs (12/16/2022)   PRAPARE - Administrator, Civil Service (Medical): No    Lack of Transportation (Non-Medical):  No  Physical Activity: Not on file  Stress: Not on file  Social Connections: Not on file    Allergies:  No Known Allergies  Objective:    Vital Signs:   Temp:  [97.8 F (36.6 C)-98.5 F (36.9 C)] 97.8 F (36.6 C) (09/13 1104) Pulse Rate:  [0-103] 89 (09/13 1000) Resp:  [14-25] 20 (09/12 2000) BP: (97-135)/(63-91) 113/74 (09/13 1000) SpO2:  [91 %-98 %] 94 % (09/13 1000) Last BM Date : 12/15/22  Weight change: Filed Weights   12/16/22 0941  Weight: 72.6 kg    Intake/Output:   Intake/Output Summary (Last 24 hours) at 12/17/2022 1138 Last data filed at 12/17/2022 1000 Gross per 24 hour  Intake 1679.54 ml  Output --  Net 1679.54 ml      Physical Exam    General:  Well appearing.  HEENT: normal Neck: supple. JVP to mid neck. Carotids 2+ bilat; no bruits.  Cor: PMI nondisplaced. Regular rate & rhythm. No rubs, gallops or murmurs. Lungs: clear Abdomen: soft, nontender, nondistended.  Extremities: no cyanosis, clubbing, rash, edema Neuro: alert & orientedx3. Affect pleasant   Telemetry   SR 80s-90s  EKG    Sinus tachycardia 109 bpm, ST elevation and Q waves in leads V2-V4  Labs   Basic Metabolic Panel: Recent Labs  Lab 12/16/22 0954 12/17/22 0258  NA 135 135  K 3.0* 3.9  CL 98 102  CO2 24 24  GLUCOSE 250* 198*  BUN 13 14  CREATININE 0.87 0.86  CALCIUM 9.1 8.8*    Liver Function Tests: Recent Labs  Lab 12/16/22 0954  AST 33  ALT 15  ALKPHOS 169*  BILITOT 0.7  PROT 7.9  ALBUMIN 3.6   No results for input(s): "LIPASE", "AMYLASE" in the last 168 hours. No results for input(s): "AMMONIA" in the last 168 hours.  CBC: Recent Labs  Lab 12/16/22 0954 12/17/22 0258  WBC 15.4* 11.1*  NEUTROABS 11.4*  --   HGB 13.9 12.1*  HCT 41.9 37.1*  MCV 94.4 92.5  PLT 317 324    Cardiac Enzymes: No results for input(s): "CKTOTAL", "CKMB", "CKMBINDEX", "TROPONINI" in the last 168 hours.  BNP: BNP (last 3 results) No results for input(s): "BNP"  in the last 8760 hours.  ProBNP (last 3 results) No results for input(s): "PROBNP" in the last 8760 hours.   CBG: No results for input(s): "GLUCAP" in the last 168 hours.  Coagulation Studies: Recent Labs    12/16/22 0954  LABPROT 12.8  INR 0.9     Imaging   VAS US DOPPLER PRE CABG  Result Date: 12/16/2022 PREOPERATIVE VASCULAR EVALUATION Patient Name:  Terry White  Date of Exam:   12/16/2022 Medical Rec #: [id number]       Accession #:    [id number] Date of Birth: 06/03/1940       Patient Gender: M Patient Age:   50 years Exam Location:  [facility]  Other Topics Concern   Not on file  Social History Narrative   Not on file   Social Determinants of Health   Financial Resource Strain: Not on file  Food Insecurity: No Food Insecurity (12/16/2022)   Hunger Vital Sign    Worried About Running Out of Food in the Last Year: Never true    Ran Out of Food in the Last Year: Never true  Transportation Needs: No Transportation Needs (12/16/2022)   PRAPARE - Administrator, Civil Service (Medical): No    Lack of Transportation (Non-Medical):  No  Physical Activity: Not on file  Stress: Not on file  Social Connections: Not on file    Allergies:  No Known Allergies  Objective:    Vital Signs:   Temp:  [97.8 F (36.6 C)-98.5 F (36.9 C)] 97.8 F (36.6 C) (09/13 1104) Pulse Rate:  [0-103] 89 (09/13 1000) Resp:  [14-25] 20 (09/12 2000) BP: (97-135)/(63-91) 113/74 (09/13 1000) SpO2:  [91 %-98 %] 94 % (09/13 1000) Last BM Date : 12/15/22  Weight change: Filed Weights   12/16/22 0941  Weight: 72.6 kg    Intake/Output:   Intake/Output Summary (Last 24 hours) at 12/17/2022 1138 Last data filed at 12/17/2022 1000 Gross per 24 hour  Intake 1679.54 ml  Output --  Net 1679.54 ml      Physical Exam    General:  Well appearing.  HEENT: normal Neck: supple. JVP to mid neck. Carotids 2+ bilat; no bruits.  Cor: PMI nondisplaced. Regular rate & rhythm. No rubs, gallops or murmurs. Lungs: clear Abdomen: soft, nontender, nondistended.  Extremities: no cyanosis, clubbing, rash, edema Neuro: alert & orientedx3. Affect pleasant   Telemetry   SR 80s-90s  EKG    Sinus tachycardia 109 bpm, ST elevation and Q waves in leads V2-V4  Labs   Basic Metabolic Panel: Recent Labs  Lab 12/16/22 0954 12/17/22 0258  NA 135 135  K 3.0* 3.9  CL 98 102  CO2 24 24  GLUCOSE 250* 198*  BUN 13 14  CREATININE 0.87 0.86  CALCIUM 9.1 8.8*    Liver Function Tests: Recent Labs  Lab 12/16/22 0954  AST 33  ALT 15  ALKPHOS 169*  BILITOT 0.7  PROT 7.9  ALBUMIN 3.6   No results for input(s): "LIPASE", "AMYLASE" in the last 168 hours. No results for input(s): "AMMONIA" in the last 168 hours.  CBC: Recent Labs  Lab 12/16/22 0954 12/17/22 0258  WBC 15.4* 11.1*  NEUTROABS 11.4*  --   HGB 13.9 12.1*  HCT 41.9 37.1*  MCV 94.4 92.5  PLT 317 324    Cardiac Enzymes: No results for input(s): "CKTOTAL", "CKMB", "CKMBINDEX", "TROPONINI" in the last 168 hours.  BNP: BNP (last 3 results) No results for input(s): "BNP"  in the last 8760 hours.  ProBNP (last 3 results) No results for input(s): "PROBNP" in the last 8760 hours.   CBG: No results for input(s): "GLUCAP" in the last 168 hours.  Coagulation Studies: Recent Labs    12/16/22 0954  LABPROT 12.8  INR 0.9     Imaging   VAS US DOPPLER PRE CABG  Result Date: 12/16/2022 PREOPERATIVE VASCULAR EVALUATION Patient Name:  Terry White  Date of Exam:   12/16/2022 Medical Rec #: [id number]       Accession #:    [id number] Date of Birth: 06/03/1940       Patient Gender: M Patient Age:   50 years Exam Location:  [facility]

## 2022-12-17 NOTE — Progress Notes (Signed)
   Patient Name: Terry White Date of Encounter: 12/17/2022 [facility] Cardiologist: Verne Carrow, MD   Interval Summary  .    Patient feeling well this morning.  No chest pain or shortness of breath.  No family present at the bedside.  Dr. Karolee Ohs notes reviewed.  Vital Signs .    Vitals:   12/17/22 0500 12/17/22 0600 12/17/22 0700 12/17/22 0737  BP: 113/76 114/75 (!) 130/91   Pulse: 91 90 (!) 103   Resp:      Temp:    98.5 F (36.9 C)  TempSrc:    Oral  SpO2: 91% 92% 92%   Weight:      Height:        Intake/Output Summary (Last 24 hours) at 12/17/2022 1003 Last data filed at 12/17/2022 0600 Gross per 24 hour  Intake 1587.78 ml  Output --  Net 1587.78 ml      12/16/2022    9:41 AM 01/03/2017   11:33 AM 08/04/2016   10:48 AM  Last 3 Weights  Weight (lbs) 160 lb 174 lb 4 oz 166 lb 9.6 oz  Weight (kg) 72.576 kg 79.039 kg 75.569 kg      Telemetry/ECG    Sinus rhythm without significant arrhythmia- Personally Reviewed  Physical Exam .   GEN: No acute distress.  Pleasant elderly male Neck: No JVD Cardiac: RRR, no murmurs, rubs, or gallops.  Respiratory: Clear to auscultation bilaterally. GI: Soft, nontender, non-distended  MS: No edema.  Right radial cath site clear with no hematoma or ecchymosis  Assessment & Plan .     1.  Acute anterior STEMI: Status post balloon angioplasty to the LAD.  Very complex disease with multiple total occlusions and severely calcified vessels.  Reperfusion achieved, patient chest pain-free and hemodynamically stable.  However, with severe multivessel disease and severe LV dysfunction, urgent cardiac surgical consultation was obtained and there are plans to take him for multivessel CABG in the middle of next week once he has time to recover from his infarct and his medical therapy can be optimized.  Will hold on P2 Y12 inhibition with upcoming surgery.  Will continue aspirin for antiplatelet therapy.  He will complete 18  hours of Aggrastat infusion.  I think after that, we should change him to IV heparin as the bleeding risk with 2B3A inhibition in elderly patients is quite significant over several days.  Orders written for such.  EKG personally reviewed and shows sinus rhythm 90 bpm with changes of an evolving anterior infarct. 2.  Acute systolic heart failure: Secondary to acute myocardial infarction and multivessel CAD.  LVEDP 22 mmHg.  No edema on chest x-ray.  Will defer to the heart failure team for management.  With a stable rhythm, no active signs of acute heart failure, and very severe LV dysfunction, will hold on beta-blocker therapy this morning.  Disposition: Keep in 2H CVICU today, transition Aggrastat to IV heparin, advanced heart failure team to evaluate later today or tomorrow morning and optimize prior to cardiac surgery.  For questions or updates, please contact [facility] Please consult [email or web address] for contact info under   Signed, Tonny Bollman, MD

## 2022-12-17 NOTE — Progress Notes (Signed)
     [address]       [place] [address]             [phone]        Pt stable overnight. No CP ECHO with significant LV dysfunction and with significant troponins Will hold on surgery till Wednesday AHF to assist in optimizing medical management prior to surgery

## 2022-12-17 NOTE — Progress Notes (Signed)
ANTICOAGULATION CONSULT NOTE - Initial Consult  Pharmacy Consult for heparin Indication: chest pain/ACS  No Known Allergies  Patient Measurements: Height: 5\' 10"  (177.8 cm) Weight: 72.6 kg (160 lb) IBW/kg (Calculated) : 73 Heparin Dosing Weight: 72.6 kg  Vital Signs: Temp: 98.5 F (36.9 C) (09/13 0737) Temp Source: Oral (09/13 0737) BP: 130/91 (09/13 0700) Pulse Rate: 103 (09/13 0700)  Labs: Recent Labs    12/16/22 0954 12/16/22 1329 12/17/22 0258  HGB 13.9  --  12.1*  HCT 41.9  --  37.1*  PLT 317  --  324  APTT 31  --   --   LABPROT 12.8  --   --   INR 0.9  --   --   CREATININE 0.87  --  0.86  TROPONINIHS 2,608* 5,137*  --     Estimated Creatinine Clearance: 71.5 mL/min (by C-G formula based on SCr of 0.86 mg/dL).   Medical History: Past Medical History:  Diagnosis Date   Gout    HTN (hypertension)     Medications:  Scheduled:   allopurinol  300 mg Oral QPM   aspirin  81 mg Oral Daily   atorvastatin  80 mg Oral Daily   Chlorhexidine Gluconate Cloth  6 each Topical Daily   mupirocin ointment  1 Application Nasal BID   nitroGLYCERIN  1 inch Topical Once   potassium chloride  60 mEq Oral Daily   sodium chloride flush  3 mL Intravenous Q12H   Infusions:   sodium chloride Stopped (12/16/22 1010)   sodium chloride 10 mL/hr at 12/17/22 0600    Assessment: 82 yo M presented from [facility] for LHC in the setting of STEMI. Patient started on Aggrastat during cath procedure, unable to open culprit lesion with PCI, plan for eventual CABG after stabilization of LV function. Not on anticoagulation prior to admission.  Hgb (12.1) and PTS (317) are stable, no signs of bleeding while on Aggrastat. Will plan to start heparin with no bolus given administration of Aggrastat.  Goal of Therapy:  Heparin level 0.3-0.7 units/ml Monitor platelets by anticoagulation protocol: Yes   Plan:  Discontinue Aggrastat infusion Start heparin infusion at 900 units/hr Check 8  hour heparin level Monitor daily heparin level and CBC Monitor for signs/symptoms of bleeding  Romie Minus, PharmD PGY1 Pharmacy Resident  Please check AMION for all [facility] Pharmacy phone numbers After 10:00 PM, call Main Pharmacy [phone]

## 2022-12-17 NOTE — Progress Notes (Signed)
   Called by RN with lactic of 3.0 up from 2.5. He remains hemodynamically stable. Sitting up on the side of the bed. Warm and dry. BP in the 120s systolic, he is borderline tachycardic with rates in the 110s but again no signs of shock. Will repeat lactic acid around 6pm to follow trend. Reviewed with MD  Signed, Laverda Page, NP-C 12/17/2022, 5:02 PM Pager: [phone]

## 2022-12-18 DIAGNOSIS — I2510 Atherosclerotic heart disease of native coronary artery without angina pectoris: Secondary | ICD-10-CM | POA: Diagnosis not present

## 2022-12-18 DIAGNOSIS — I2109 ST elevation (STEMI) myocardial infarction involving other coronary artery of anterior wall: Secondary | ICD-10-CM | POA: Diagnosis not present

## 2022-12-18 DIAGNOSIS — R570 Cardiogenic shock: Secondary | ICD-10-CM | POA: Diagnosis not present

## 2022-12-18 DIAGNOSIS — E118 Type 2 diabetes mellitus with unspecified complications: Secondary | ICD-10-CM | POA: Diagnosis not present

## 2022-12-18 LAB — BASIC METABOLIC PANEL
Anion gap: 13 (ref 5–15)
BUN: 15 mg/dL (ref 8–23)
CO2: 26 mmol/L (ref 22–32)
Calcium: 9.5 mg/dL (ref 8.9–10.3)
Chloride: 97 mmol/L — ABNORMAL LOW (ref 98–111)
Creatinine, Ser: 0.88 mg/dL (ref 0.61–1.24)
GFR, Estimated: >60 mL/min (ref >60)
Glucose, Bld: 173 mg/dL — ABNORMAL HIGH (ref 70–99)
Potassium: 3.9 mmol/L (ref 3.5–5.1)
Sodium: 136 mmol/L (ref 135–145)

## 2022-12-18 LAB — LACTIC ACID, PLASMA: Lactic Acid, Venous: 1.5 mmol/L (ref 0.5–1.9)

## 2022-12-18 LAB — CBC
HCT: 40.1 % (ref 39.0–52.0)
Hemoglobin: 13.4 g/dL (ref 13.0–17.0)
MCH: 31.5 pg (ref 26.0–34.0)
MCHC: 33.4 g/dL (ref 30.0–36.0)
MCV: 94.1 fL (ref 80.0–100.0)
Platelets: 341 10*3/uL (ref 150–400)
RBC: 4.26 MIL/uL (ref 4.22–5.81)
RDW: 14.5 % (ref 11.5–15.5)
WBC: 11.8 10*3/uL — ABNORMAL HIGH (ref 4.0–10.5)
nRBC: 0 % (ref 0.0–0.2)

## 2022-12-18 LAB — HEPARIN LEVEL (UNFRACTIONATED)
Heparin Unfractionated: <0.1 [IU]/mL — ABNORMAL LOW (ref 0.30–0.70)
Heparin Unfractionated: 0.19 [IU]/mL — ABNORMAL LOW (ref 0.30–0.70)
Heparin Unfractionated: 0.32 [IU]/mL (ref 0.30–0.70)

## 2022-12-18 LAB — GLUCOSE, CAPILLARY
Glucose-Capillary: 164 mg/dL — ABNORMAL HIGH (ref 70–99)
Glucose-Capillary: 188 mg/dL — ABNORMAL HIGH (ref 70–99)
Glucose-Capillary: 123 mg/dL — ABNORMAL HIGH (ref 70–99)
Glucose-Capillary: 194 mg/dL — ABNORMAL HIGH (ref 70–99)

## 2022-12-18 MED ORDER — MENTHOL 3 MG MT LOZG
1.0000 | LOZENGE | OROMUCOSAL | Status: DC | PRN
  Filled 2022-12-18: qty 9

## 2022-12-18 NOTE — Progress Notes (Addendum)
   Patient Name: Terry White Date of Encounter: 12/18/2022 [facility] Cardiologist: Verne Carrow, MD   Interval Summary  .    No further chest pain.  Less tachycardic, blood pressure normal.  Follow-up lactic acid level yesterday evening was improving.  Normal renal function parameters.  Vital Signs .    Vitals:   12/18/22 0500 12/18/22 0600 12/18/22 0645 12/18/22 0715  BP: 100/64 (!) 93/57    Pulse: 83 78    Resp:      Temp:   97.7 F (36.5 C) 97.7 F (36.5 C)  TempSrc:   Oral Oral  SpO2: 96% 93%    Weight:      Height:        Intake/Output Summary (Last 24 hours) at 12/18/2022 0821 Last data filed at 12/18/2022 0630 Gross per 24 hour  Intake 448.87 ml  Output 2900 ml  Net -2451.13 ml      12/16/2022    9:41 AM 01/03/2017   11:33 AM 08/04/2016   10:48 AM  Last 3 Weights  Weight (lbs) 160 lb 174 lb 4 oz 166 lb 9.6 oz  Weight (kg) 72.576 kg 79.039 kg 75.569 kg      Telemetry/ECG  EKG shows Q waves V1-V3 with persistent ST segment elevation and inverting T waves in V1-V5, prolonged QTc 511 ms Telemetry shows sinus rhythm with occasional PVCs - Personally Reviewed  Physical Exam .   GEN: No acute distress.   Neck: No JVD Cardiac: RRR, no murmurs, rubs, or gallops.  Respiratory: Clear to auscultation bilaterally. GI: Soft, nontender, non-distended  MS: No edema  Assessment & Plan .     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 LAD treated with emergency balloon angioplasty (revascularization limited by heavy calcification and inability to fully expand the angioplasty balloon).  Showed marked improvement in hemodynamics and metabolic  parameters of the last 24 hours.  No longer has features suggestive of cardiogenic shock.  Good urine output and normal renal function.  Last lactic acidosis, down to 2.4 yesterday evening.  Awaiting additional stabilization prior to planned CABG, on intravenous heparin, high-dose statin, aspirin.  Will add a very low-dose of carvedilol, monitor for hypotension.  CRITICAL CARE Performed by: Rachelle Hora Anhelica Fowers   Total critical care time: 30 minutes  Critical care time was exclusive of separately billable procedures and treating other patients.  Critical care was necessary to treat or prevent imminent or life-threatening deterioration.  Critical care was time spent personally by me on the following activities: development of treatment plan with patient and/or surrogate as well as nursing, discussions with consultants, evaluation of patient's response to treatment, examination of patient, obtaining history from patient or surrogate, ordering and performing treatments and interventions, ordering and review of laboratory studies, ordering and review of radiographic studies, pulse oximetry and re-evaluation of patient's condition.   For questions or updates, please contact [facility] Please consult [email or web address] for contact info under        Signed, Thurmon Fair, MD

## 2022-12-18 NOTE — Progress Notes (Signed)
      [address]       [place] [address]             [phone]      2 Days Post-Op  Procedure(s) (LRB): Coronary/Graft Acute MI Revascularization (N/A) LEFT HEART CATH AND CORONARY ANGIOGRAPHY (N/A)   Total Length of Stay:  LOS: 2 days    SUBJECTIVE: Had some upper abd discomfort when he woke up. Not like he had with his CP No SOB  Vitals:   12/18/22 0645 12/18/22 0715  BP:    Pulse:    Resp:    Temp: 97.7 F (36.5 C) 97.7 F (36.5 C)  SpO2:      Intake/Output      09/13 0701 09/14 0700 09/14 0701 09/15 0700   P.O.     I.V. (mL/kg) 494.8 (6.8)    Total Intake(mL/kg) 494.8 (6.8)    Urine (mL/kg/hr) 2900 (1.7)    Total Output 2900    Net -2405.3         Urine Occurrence 1 x        sodium chloride Stopped (12/16/22 1010)   sodium chloride 10 mL/hr at 12/18/22 0600   heparin 1,300 Units/hr (12/18/22 0600)    CBC    Component Value Date/Time   WBC 11.8 (H) 12/18/2022 0221   RBC 4.26 12/18/2022 0221   HGB 13.4 12/18/2022 0221   HCT 40.1 12/18/2022 0221   PLT 341 12/18/2022 0221   MCV 94.1 12/18/2022 0221   MCH 31.5 12/18/2022 0221   MCHC 33.4 12/18/2022 0221   RDW 14.5 12/18/2022 0221   LYMPHSABS 2.5 12/16/2022 0954   MONOABS 0.9 12/16/2022 0954   EOSABS 0.4 12/16/2022 0954   BASOSABS 0.1 12/16/2022 0954   CMP     Component Value Date/Time   NA 136 12/18/2022 0221   K 3.9 12/18/2022 0221   CL 97 (L) 12/18/2022 0221   CO2 26 12/18/2022 0221   GLUCOSE 173 (H) 12/18/2022 0221   BUN 15 12/18/2022 0221   CREATININE 0.88 12/18/2022 0221   CALCIUM 9.5 12/18/2022 0221   PROT 8.5 (H) 12/17/2022 1521   ALBUMIN 3.4 (L) 12/17/2022 1521   AST 49 (H) 12/17/2022 1521   ALT 18 12/17/2022 1521   ALKPHOS 158 (H) 12/17/2022 1521   BILITOT 0.6 12/17/2022 1521   GFRNONAA >60 12/18/2022 0221   ABG No results found for: "PHART", "PCO2ART", "PO2ART", "HCO3", "TCO2", "ACIDBASEDEF", "O2SAT" CBG (last 3)  Recent Labs    12/17/22 1647  12/17/22 2142 12/18/22 0649  GLUCAP 240* 167* 164*     ASSESSMENT: CAD with STEMI and sp Balloon angioplasty Awaiting optimization of medical management prior to CABG   Eugenio Hoes, MD 12/18/2022

## 2022-12-18 NOTE — Progress Notes (Signed)
ANTICOAGULATION CONSULT NOTE - Follow Up  Pharmacy Consult for heparin Indication: chest pain/ACS  No Known Allergies  Patient Measurements: Height: 5\' 10"  (177.8 cm) Weight: 72.6 kg (160 lb) IBW/kg (Calculated) : 73 Heparin Dosing Weight: 72.6 kg  Vital Signs: Temp: 98.2 F (36.8 C) (09/14 1115) Temp Source: Oral (09/14 1115) BP: 159/97 (09/14 1200) Pulse Rate: 97 (09/14 1200)  Labs: Recent Labs    12/16/22 0954 12/16/22 1329 12/17/22 0258 12/17/22 1956 12/18/22 0221 12/18/22 1219  HGB 13.9  --  12.1*  --  13.4  --   HCT 41.9  --  37.1*  --  40.1  --   PLT 317  --  324  --  341  --   APTT 31  --   --   --   --   --   LABPROT 12.8  --   --   --   --   --   INR 0.9  --   --   --   --   --   HEPARINUNFRC  --   --   --  <0.10* <0.10* 0.19*  CREATININE 0.87  --  0.86  --  0.88  --   TROPONINIHS 2,608* 5,137*  --   --   --   --     Estimated Creatinine Clearance: 69.9 mL/min (by C-G formula based on SCr of 0.88 mg/dL).   Medical History: Past Medical History:  Diagnosis Date   Gout    HTN (hypertension)     Medications:  Scheduled:   allopurinol  300 mg Oral QPM   aspirin  81 mg Oral Daily   atorvastatin  80 mg Oral Daily   Chlorhexidine Gluconate Cloth  6 each Topical Daily   digoxin  0.125 mg Oral Daily   insulin aspart  0-5 Units Subcutaneous QHS   insulin aspart  0-9 Units Subcutaneous TID WC   nitroGLYCERIN  1 inch Topical Once   potassium chloride  60 mEq Oral Daily   sodium chloride flush  3 mL Intravenous Q12H   Infusions:   sodium chloride Stopped (12/16/22 1010)   sodium chloride 10 mL/hr at 12/18/22 1200   heparin 1,300 Units/hr (12/18/22 1200)    Assessment: 82 yo White presented from [facility] for LHC in the setting of STEMI. Patient started on Aggrastat during cath procedure, unable to open culprit lesion with PCI, plan for eventual CABG after stabilization of LV function. Not on anticoagulation prior to admission.  Heparin level today  came back subtherapeutic at 0.19, on 1300 units/hr. Hgb 13.4, plt 341. No s/sx of bleeding or infusion issues.   Goal of Therapy:  Heparin level 0.3-0.7 units/ml Monitor platelets by anticoagulation protocol: Yes   Plan:  Increase IV heparin to 1500 units/hr Check 8 hour heparin level Monitor daily heparin level and CBC Monitor for signs/symptoms of bleeding  Thank you for allowing pharmacy to participate in this patient's care,  Sherron Monday, PharmD, BCCCP Clinical Pharmacist  Phone: [phone] 12/18/2022 1:18 PM  Please check AMION for all [facility] Pharmacy phone numbers After 10:00 PM, call Main Pharmacy [phone]

## 2022-12-18 NOTE — Progress Notes (Signed)
ANTICOAGULATION CONSULT NOTE - Follow Up  Pharmacy Consult for heparin Indication: chest pain/ACS  No Known Allergies  Patient Measurements: Height: 5\' 10"  (177.8 cm) Weight: 72.6 kg (160 lb) IBW/kg (Calculated) : 73 Heparin Dosing Weight: 72.6 kg  Vital Signs: Temp: 97.7 F (36.5 C) (09/14 2211) Temp Source: Oral (09/14 2211) BP: 121/72 (09/14 2200) Pulse Rate: 81 (09/14 2200)  Labs: Recent Labs    12/16/22 0954 12/16/22 1329 12/17/22 0258 12/17/22 1956 12/18/22 0221 12/18/22 1219 12/18/22 2157  HGB 13.9  --  12.1*  --  13.4  --   --   HCT 41.9  --  37.1*  --  40.1  --   --   PLT 317  --  324  --  341  --   --   APTT 31  --   --   --   --   --   --   LABPROT 12.8  --   --   --   --   --   --   INR 0.9  --   --   --   --   --   --   HEPARINUNFRC  --   --   --    < > <0.10* 0.19* 0.32  CREATININE 0.87  --  0.86  --  0.88  --   --   TROPONINIHS 2,608* 5,137*  --   --   --   --   --    < > = values in this interval not displayed.    Estimated Creatinine Clearance: 69.9 mL/min (by C-G formula based on SCr of 0.88 mg/dL).   Medical History: Past Medical History:  Diagnosis Date   Gout    HTN (hypertension)     Medications:  Scheduled:   allopurinol  300 mg Oral QPM   aspirin  81 mg Oral Daily   atorvastatin  80 mg Oral Daily   Chlorhexidine Gluconate Cloth  6 each Topical Daily   digoxin  0.125 mg Oral Daily   insulin aspart  0-5 Units Subcutaneous QHS   insulin aspart  0-9 Units Subcutaneous TID WC   nitroGLYCERIN  1 inch Topical Once   potassium chloride  60 mEq Oral Daily   sodium chloride flush  3 mL Intravenous Q12H   Infusions:   sodium chloride Stopped (12/16/22 1010)   sodium chloride 10 mL/hr at 12/18/22 2100   heparin 1,500 Units/hr (12/18/22 2100)    Assessment: 82 yo M presented from [facility] for LHC in the setting of STEMI. Patient started on Aggrastat during cath procedure, unable to open culprit lesion with PCI, plan for eventual  CABG after stabilization of LV function. Not on anticoagulation prior to admission.  Heparin level 0.32 at goal on heparin drip on 1500 units/hr. Hgb 13.4, plt 341. No s/sx of bleeding or infusion issues.   Goal of Therapy:  Heparin level 0.3-0.7 units/ml Monitor platelets by anticoagulation protocol: Yes   Plan:  Continue IV heparin  1500 units/hr Monitor daily heparin level and CBC Monitor for signs/symptoms of bleeding   Leota Sauers Pharm.D. CPP, BCPS Clinical Pharmacist [phone] 12/18/2022 10:57 PM    Please check AMION for all [facility] Pharmacy phone numbers After 10:00 PM, call Main Pharmacy [phone]

## 2022-12-18 NOTE — Plan of Care (Signed)

## 2022-12-18 NOTE — Progress Notes (Signed)
ANTICOAGULATION CONSULT NOTE - Follow Up  Pharmacy Consult for heparin Indication: chest pain/ACS  No Known Allergies  Patient Measurements: Height: 5\' 10"  (177.8 cm) Weight: 72.6 kg (160 lb) IBW/kg (Calculated) : 73 Heparin Dosing Weight: 72.6 kg  Vital Signs: Temp: 98.2 F (36.8 C) (09/13 2100) Temp Source: Oral (09/13 2100) BP: 109/79 (09/14 0200) Pulse Rate: 90 (09/14 0200)  Labs: Recent Labs    12/16/22 0954 12/16/22 1329 12/17/22 0258 12/17/22 1956 12/18/22 0221  HGB 13.9  --  12.1*  --  13.4  HCT 41.9  --  37.1*  --  40.1  PLT 317  --  324  --  341  APTT 31  --   --   --   --   LABPROT 12.8  --   --   --   --   INR 0.9  --   --   --   --   HEPARINUNFRC  --   --   --  <0.10* <0.10*  CREATININE 0.87  --  0.86  --  0.88  TROPONINIHS 2,608* 5,137*  --   --   --     Estimated Creatinine Clearance: 69.9 mL/min (by C-G formula based on SCr of 0.88 mg/dL).   Medical History: Past Medical History:  Diagnosis Date   Gout    HTN (hypertension)     Medications:  Scheduled:   allopurinol  300 mg Oral QPM   aspirin  81 mg Oral Daily   atorvastatin  80 mg Oral Daily   Chlorhexidine Gluconate Cloth  6 each Topical Daily   digoxin  0.125 mg Oral Daily   insulin aspart  0-5 Units Subcutaneous QHS   insulin aspart  0-9 Units Subcutaneous TID WC   mupirocin ointment  1 Application Nasal BID   nitroGLYCERIN  1 inch Topical Once   potassium chloride  60 mEq Oral Daily   sodium chloride flush  3 mL Intravenous Q12H   Infusions:   sodium chloride Stopped (12/16/22 1010)   sodium chloride 10 mL/hr at 12/18/22 0200   heparin 1,100 Units/hr (12/18/22 0200)    Assessment: 82 yo M presented from [facility] for LHC in the setting of STEMI. Patient started on Aggrastat during cath procedure, unable to open culprit lesion with PCI, plan for eventual CABG after stabilization of LV function. Not on anticoagulation prior to admission.  Hgb (12.1) and PTS (317) are stable,  no signs of bleeding while on Aggrastat. Will plan to start heparin with no bolus given administration of Aggrastat.  9/14 AM: heparin level undetectable on 1100 units/hr.  No known issues with IV infusion per RN.  No overt bleeding or complications. Last CBC stable  Goal of Therapy:  Heparin level 0.3-0.7 units/ml Monitor platelets by anticoagulation protocol: Yes   Plan:  Increase IV heparin to 1300 units/hr Check 8 hour heparin level Monitor daily heparin level and CBC Monitor for signs/symptoms of bleeding  Arabella Merles, PharmD. Clinical Pharmacist 12/18/2022 3:46 AM

## 2022-12-19 DIAGNOSIS — I2510 Atherosclerotic heart disease of native coronary artery without angina pectoris: Secondary | ICD-10-CM | POA: Diagnosis not present

## 2022-12-19 DIAGNOSIS — I2109 ST elevation (STEMI) myocardial infarction involving other coronary artery of anterior wall: Secondary | ICD-10-CM | POA: Diagnosis not present

## 2022-12-19 LAB — CBC
HCT: 40.2 % (ref 39.0–52.0)
Hemoglobin: 13.1 g/dL (ref 13.0–17.0)
MCH: 30.3 pg (ref 26.0–34.0)
MCHC: 32.6 g/dL (ref 30.0–36.0)
MCV: 92.8 fL (ref 80.0–100.0)
Platelets: 330 10*3/uL (ref 150–400)
RBC: 4.33 MIL/uL (ref 4.22–5.81)
RDW: 14.2 % (ref 11.5–15.5)
WBC: 10.2 10*3/uL (ref 4.0–10.5)
nRBC: 0 % (ref 0.0–0.2)

## 2022-12-19 LAB — GLUCOSE, CAPILLARY
Glucose-Capillary: 164 mg/dL — ABNORMAL HIGH (ref 70–99)
Glucose-Capillary: 173 mg/dL — ABNORMAL HIGH (ref 70–99)
Glucose-Capillary: 164 mg/dL — ABNORMAL HIGH (ref 70–99)
Glucose-Capillary: 172 mg/dL — ABNORMAL HIGH (ref 70–99)

## 2022-12-19 LAB — BASIC METABOLIC PANEL
Anion gap: 13 (ref 5–15)
BUN: 17 mg/dL (ref 8–23)
CO2: 23 mmol/L (ref 22–32)
Calcium: 9.5 mg/dL (ref 8.9–10.3)
Chloride: 98 mmol/L (ref 98–111)
Creatinine, Ser: 0.94 mg/dL (ref 0.61–1.24)
GFR, Estimated: >60 mL/min (ref >60)
Glucose, Bld: 169 mg/dL — ABNORMAL HIGH (ref 70–99)
Potassium: 4.4 mmol/L (ref 3.5–5.1)
Sodium: 134 mmol/L — ABNORMAL LOW (ref 135–145)

## 2022-12-19 LAB — HEPARIN LEVEL (UNFRACTIONATED): Heparin Unfractionated: 0.37 [IU]/mL (ref 0.30–0.70)

## 2022-12-19 MED ORDER — CARVEDILOL 3.125 MG PO TABS
3.1250 mg | ORAL_TABLET | Freq: Two times a day (BID) | ORAL | Status: DC
  Administered 2022-12-19 – 2022-12-21 (×5): 3.125 mg via ORAL
  Filled 2022-12-19 (×5): qty 1

## 2022-12-19 NOTE — Progress Notes (Signed)
ANTICOAGULATION CONSULT NOTE - Follow Up  Pharmacy Consult for heparin Indication: chest pain/ACS  No Known Allergies  Patient Measurements: Height: 5\' 10"  (177.8 cm) Weight: 72.6 kg (160 lb) IBW/kg (Calculated) : 73 Heparin Dosing Weight: 72.6 kg  Vital Signs: Temp: 97.7 F (36.5 C) (09/15 0730) Temp Source: Oral (09/15 0730) BP: 130/80 (09/15 1000) Pulse Rate: 98 (09/15 1000)  Labs: Recent Labs     0000 12/16/22 1329 12/17/22 0258 12/17/22 1956 12/18/22 0221 12/18/22 1219 12/18/22 2157 12/19/22 0439  HGB   < >  --  12.1*  --  13.4  --   --  13.1  HCT  --   --  37.1*  --  40.1  --   --  40.2  PLT  --   --  324  --  341  --   --  330  HEPARINUNFRC  --   --   --    < > <0.10* 0.19* 0.32 0.37  CREATININE  --   --  0.86  --  0.88  --   --  0.94  TROPONINIHS  --  5,137*  --   --   --   --   --   --    < > = values in this interval not displayed.    Estimated Creatinine Clearance: 65.4 mL/min (by C-G formula based on SCr of 0.94 mg/dL).   Medical History: Past Medical History:  Diagnosis Date   Gout    HTN (hypertension)     Medications:  Scheduled:   allopurinol  300 mg Oral QPM   aspirin  81 mg Oral Daily   atorvastatin  80 mg Oral Daily   carvedilol  3.125 mg Oral BID WC   Chlorhexidine Gluconate Cloth  6 each Topical Daily   digoxin  0.125 mg Oral Daily   insulin aspart  0-5 Units Subcutaneous QHS   insulin aspart  0-9 Units Subcutaneous TID WC   nitroGLYCERIN  1 inch Topical Once   potassium chloride  60 mEq Oral Daily   sodium chloride flush  3 mL Intravenous Q12H   Infusions:   sodium chloride Stopped (12/16/22 1010)   sodium chloride 10 mL/hr at 12/19/22 1000   heparin 1,500 Units/hr (12/19/22 1000)    Assessment: 82 yo M presented from [facility] for LHC in the setting of STEMI. Patient started on Aggrastat during cath procedure, unable to open culprit lesion with PCI, plan for eventual CABG after stabilization of LV function. Not on  anticoagulation prior to admission.  Heparin level came back therapeutic at 0.37, on 1500 units/hr. Hgb 13.1, plt 330. No s/sx of bleeding or infusion issues.    Goal of Therapy:  Heparin level 0.3-0.7 units/ml Monitor platelets by anticoagulation protocol: Yes   Plan:  Continue IV heparin infusion at 1500 units/hr Monitor daily heparin level and CBC Monitor for signs/symptoms of bleeding  Thank you for allowing pharmacy to participate in this patient's care,  Sherron Monday, PharmD, BCCCP Clinical Pharmacist  Phone: [phone] 12/19/2022 11:49 AM  Please check AMION for all [facility] Pharmacy phone numbers After 10:00 PM, call Main Pharmacy [phone]

## 2022-12-19 NOTE — Progress Notes (Signed)
      [address]       [place] [address]             [phone]      3 Days Post-Op  Procedure(s) (LRB): Coronary/Graft Acute MI Revascularization (N/A) LEFT HEART CATH AND CORONARY ANGIOGRAPHY (N/A)   Total Length of Stay:  LOS: 3 days    SUBJECTIVE: Feels well. No abd discomfort Has not walked  Vitals:   12/19/22 0616 12/19/22 0730  BP:    Pulse:    Resp:    Temp: 97.7 F (36.5 C) 97.7 F (36.5 C)  SpO2:      Intake/Output      09/14 0701 09/15 0700 09/15 0701 09/16 0700   P.O. 240    I.V. (mL/kg) 609.1 (8.4)    Total Intake(mL/kg) 849.1 (11.7)    Urine (mL/kg/hr)     Total Output     Net +849.1         Urine Occurrence 9 x    Stool Occurrence 1 x        sodium chloride Stopped (12/16/22 1010)   sodium chloride 10 mL/hr at 12/19/22 0700   heparin 1,500 Units/hr (12/19/22 0700)    CBC    Component Value Date/Time   WBC 10.2 12/19/2022 0439   RBC 4.33 12/19/2022 0439   HGB 13.1 12/19/2022 0439   HCT 40.2 12/19/2022 0439   PLT 330 12/19/2022 0439   MCV 92.8 12/19/2022 0439   MCH 30.3 12/19/2022 0439   MCHC 32.6 12/19/2022 0439   RDW 14.2 12/19/2022 0439   LYMPHSABS 2.5 12/16/2022 0954   MONOABS 0.9 12/16/2022 0954   EOSABS 0.4 12/16/2022 0954   BASOSABS 0.1 12/16/2022 0954   CMP     Component Value Date/Time   NA 134 (L) 12/19/2022 0439   K 4.4 12/19/2022 0439   CL 98 12/19/2022 0439   CO2 23 12/19/2022 0439   GLUCOSE 169 (H) 12/19/2022 0439   BUN 17 12/19/2022 0439   CREATININE 0.94 12/19/2022 0439   CALCIUM 9.5 12/19/2022 0439   PROT 8.5 (H) 12/17/2022 1521   ALBUMIN 3.4 (L) 12/17/2022 1521   AST 49 (H) 12/17/2022 1521   ALT 18 12/17/2022 1521   ALKPHOS 158 (H) 12/17/2022 1521   BILITOT 0.6 12/17/2022 1521   GFRNONAA >60 12/19/2022 0439   ABG No results found for: "PHART", "PCO2ART", "PO2ART", "HCO3", "TCO2", "ACIDBASEDEF", "O2SAT" CBG (last 3)  Recent Labs    12/18/22 1619 12/18/22 2156  12/19/22 0603  GLUCAP 123* 194* 164*     ASSESSMENT: CAD Awaiting CABG Had discussion with pt about need to work together on Risk analyst post op. He is in agreement   Eugenio Hoes, MD 12/19/2022

## 2022-12-19 NOTE — Plan of Care (Signed)

## 2022-12-19 NOTE — Progress Notes (Signed)
   Patient Name: Terry White Date of Encounter: 12/19/2022 [facility] Cardiologist: Verne Carrow, MD   Interval Summary  .    No angina or dyspnea. Upset he cannot get to the bathroom fast enough after diuretics.  Vital Signs .    Vitals:   12/19/22 0600 12/19/22 0616 12/19/22 0730 12/19/22 0800  BP: 112/76   (!) 120/99  Pulse: 77   96  Resp:      Temp:  97.7 F (36.5 C) 97.7 F (36.5 C)   TempSrc:  Oral Oral   SpO2: 97%   93%  Weight:      Height:        Intake/Output Summary (Last 24 hours) at 12/19/2022 0848 Last data filed at 12/19/2022 0800 Gross per 24 hour  Intake 708.16 ml  Output --  Net 708.16 ml      12/16/2022    9:41 AM 01/03/2017   11:33 AM 08/04/2016   10:48 AM  Last 3 Weights  Weight (lbs) 160 lb 174 lb 4 oz 166 lb 9.6 oz  Weight (kg) 72.576 kg 79.039 kg 75.569 kg      Telemetry/ECG    NSR/mild sinus tachycardia - Personally Reviewed  Physical Exam .   GEN: No acute distress.   Neck: No JVD Cardiac: RRR, no murmurs, rubs, or gallops.  Respiratory: Clear to auscultation bilaterally. GI: Soft, nontender, non-distended  MS: No edema  Assessment & Plan .     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 LAD treated with emergency balloon angioplasty (revascularization limited by heavy calcification and inability to fully expand the angioplasty balloon).   Cardiogenic shock resolved.   Awaiting additional stabilization prior to planned CABG, on intravenous heparin, high-dose statin, aspirin.  Check P2Y12 level for adequate platelet activity at time of surgery. Will add a very low-dose of carvedilol, monitor for hypotension.    For questions or updates, please contact [facility] Please consult [email or web address] for contact info under        Signed, Thurmon Fair, MD

## 2022-12-20 DIAGNOSIS — I255 Ischemic cardiomyopathy: Secondary | ICD-10-CM

## 2022-12-20 DIAGNOSIS — I2109 ST elevation (STEMI) myocardial infarction involving other coronary artery of anterior wall: Secondary | ICD-10-CM | POA: Diagnosis not present

## 2022-12-20 LAB — CBC
HCT: 41.2 % (ref 39.0–52.0)
Hemoglobin: 13.2 g/dL (ref 13.0–17.0)
MCH: 30.6 pg (ref 26.0–34.0)
MCHC: 32 g/dL (ref 30.0–36.0)
MCV: 95.6 fL (ref 80.0–100.0)
Platelets: 330 10*3/uL (ref 150–400)
RBC: 4.31 MIL/uL (ref 4.22–5.81)
RDW: 14.3 % (ref 11.5–15.5)
WBC: 12.7 10*3/uL — ABNORMAL HIGH (ref 4.0–10.5)
nRBC: 0 % (ref 0.0–0.2)

## 2022-12-20 LAB — GLUCOSE, CAPILLARY
Glucose-Capillary: 170 mg/dL — ABNORMAL HIGH (ref 70–99)
Glucose-Capillary: 181 mg/dL — ABNORMAL HIGH (ref 70–99)
Glucose-Capillary: 145 mg/dL — ABNORMAL HIGH (ref 70–99)
Glucose-Capillary: 154 mg/dL — ABNORMAL HIGH (ref 70–99)
Glucose-Capillary: 131 mg/dL — ABNORMAL HIGH (ref 70–99)

## 2022-12-20 LAB — BASIC METABOLIC PANEL
Anion gap: 13 (ref 5–15)
BUN: 16 mg/dL (ref 8–23)
CO2: 26 mmol/L (ref 22–32)
Calcium: 9.8 mg/dL (ref 8.9–10.3)
Chloride: 96 mmol/L — ABNORMAL LOW (ref 98–111)
Creatinine, Ser: 1.01 mg/dL (ref 0.61–1.24)
GFR, Estimated: >60 mL/min (ref >60)
Glucose, Bld: 164 mg/dL — ABNORMAL HIGH (ref 70–99)
Potassium: 5.1 mmol/L (ref 3.5–5.1)
Sodium: 135 mmol/L (ref 135–145)

## 2022-12-20 LAB — MAGNESIUM: Magnesium: 1.6 mg/dL — ABNORMAL LOW (ref 1.7–2.4)

## 2022-12-20 LAB — HEPARIN LEVEL (UNFRACTIONATED): Heparin Unfractionated: 0.53 [IU]/mL (ref 0.30–0.70)

## 2022-12-20 MED ORDER — MAGNESIUM SULFATE 4 GM/100ML IV SOLN
4.0000 g | Freq: Once | INTRAVENOUS | Status: AC
  Administered 2022-12-20: 4 g via INTRAVENOUS
  Filled 2022-12-20: qty 100

## 2022-12-20 NOTE — TOC Initial Note (Signed)
Transition of Care [facility]) - Initial/Assessment Note    Patient Details  Name: Terry White MRN: [id number] Date of Birth: January 23, 1941  Transition of Care [facility]) CM/SW Contact:    Nicanor Bake Phone Number: [phone] 12/20/2022, 2:48 PM  Clinical Narrative:  [facility] CSW met with pt at bedside. Pt and CSW built rapport. Pt stated that he lives at home alone. Pt stated that he has a son who drives trucks who stop through occasionally for some time. Pt stated that he has no history with HH services. Pt stated that he has a scale, has a walker that he does not use, and a walking stick. Pt stated that he only uses that to get from his bedroom to the bathroom. Pt has a PCP and CSW explained that a follow up hospital appointment will be scheduled closer to dc. Pt agrees.    Pt stated under no circumstance does he want to go to a SNF. Pt stated that he would like to dc with [facility] services and get help at home. CSW printed out list of HH options for pt and left a copy with the pt for the family to review.   Pt inquired about chaplain services and details about POA/Advance directive paperwork. CSW secured chatted physical for consult. Pt also inquired about medicaid/medicare. CSW Neurosurgeon counselors.   TOC will continue following.                  Expected Discharge Plan: Home w Home Health Services Barriers to Discharge: Continued Medical Work up   Patient Goals and CMS Choice            Expected Discharge Plan and Services       Living arrangements for the past 2 months: Single Family Home                                      Prior Living Arrangements/Services Living arrangements for the past 2 months: Single Family Home Lives with:: Self Patient language and need for interpreter reviewed:: Yes Do you feel safe going back to the place where you live?: Yes      Need for Family Participation in Patient Care: No (Comment) Care giver support system in place?: Yes  (comment)   Criminal Activity/Legal Involvement Pertinent to Current Situation/Hospitalization: No - Comment as needed  Activities of Daily Living Home Assistive Devices/Equipment: None ADL Screening (condition at time of admission) Patient's cognitive ability adequate to safely complete daily activities?: Yes Is the patient deaf or have difficulty hearing?: Yes Does the patient have difficulty seeing, even when wearing glasses/contacts?: Yes Does the patient have difficulty concentrating, remembering, or making decisions?: No Patient able to express need for assistance with ADLs?: Yes Does the patient have difficulty dressing or bathing?: No Independently performs ADLs?: Yes (appropriate for developmental age) Does the patient have difficulty walking or climbing stairs?: No Weakness of Legs: None Weakness of Arms/Hands: None  Permission Sought/Granted                  Emotional Assessment Appearance:: Appears stated age Attitude/Demeanor/Rapport: Engaged Affect (typically observed): Appropriate Orientation: : Oriented to Self, Oriented to Place, Oriented to  Time, Oriented to Situation Alcohol / Substance Use: Not Applicable Psych Involvement: No (comment)  Admission diagnosis:  ST elevation myocardial infarction (STEMI), unspecified artery (HCC) [I21.3] Acute ST elevation myocardial infarction (STEMI) of anterior wall (HCC) [I21.09]  Patient Active Problem List   Diagnosis Date Noted   Acute ST elevation myocardial infarction (STEMI) of anterior wall (HCC) 12/16/2022   PCP:  Lovey Newcomer, PA Pharmacy:   [facility] - [place], [place] - [address] [place] [address] Phone: [phone] Fax: [phone]  [id number] - [place], [place] - [address] [place] [address] Phone: [phone] Fax: [phone]     Social Determinants of Health (SDOH) Social  History: SDOH Screenings   Food Insecurity: No Food Insecurity (12/16/2022)  Housing: Low Risk  (12/16/2022)  Transportation Needs: No Transportation Needs (12/16/2022)  Utilities: Not At Risk (12/16/2022)  Tobacco Use: Medium Risk (12/16/2022)   SDOH Interventions:     Readmission Risk Interventions     No data to display

## 2022-12-20 NOTE — Progress Notes (Signed)
ANTICOAGULATION CONSULT NOTE - Follow Up  Pharmacy Consult for heparin Indication: chest pain/ACS  No Known Allergies  Patient Measurements: Height: 5\' 10"  (177.8 cm) Weight: 72.6 kg (160 lb) IBW/kg (Calculated) : 73 Heparin Dosing Weight: 72.6 kg  Vital Signs: Temp: 97.6 F (36.4 C) (09/16 1129) Temp Source: Oral (09/16 1129) BP: 111/74 (09/16 1300) Pulse Rate: 85 (09/16 1300)  Labs: Recent Labs    12/18/22 0221 12/18/22 1219 12/18/22 2157 12/19/22 0439 12/20/22 0236  HGB 13.4  --   --  13.1 13.2  HCT 40.1  --   --  40.2 41.2  PLT 341  --   --  330 330  HEPARINUNFRC <0.10*   < > 0.32 0.37 0.53  CREATININE 0.88  --   --  0.94 1.01   < > = values in this interval not displayed.    Estimated Creatinine Clearance: 60.9 mL/min (by C-G formula based on SCr of 1.01 mg/dL).   Medical History: Past Medical History:  Diagnosis Date   Gout    HTN (hypertension)     Medications:  Scheduled:   allopurinol  300 mg Oral QPM   aspirin  81 mg Oral Daily   atorvastatin  80 mg Oral Daily   carvedilol  3.125 mg Oral BID WC   Chlorhexidine Gluconate Cloth  6 each Topical Daily   digoxin  0.125 mg Oral Daily   insulin aspart  0-5 Units Subcutaneous QHS   insulin aspart  0-9 Units Subcutaneous TID WC   nitroGLYCERIN  1 inch Topical Once   sodium chloride flush  3 mL Intravenous Q12H   Infusions:   sodium chloride Stopped (12/16/22 1010)   sodium chloride Stopped (12/19/22 1643)   heparin 1,500 Units/hr (12/20/22 1300)    Assessment: 82 yo M presented from [facility] for LHC in the setting of STEMI. Patient started on Aggrastat during cath procedure, unable to open culprit lesion with PCI, plan for eventual CABG after stabilization of LV function. Not on anticoagulation prior to admission.  Heparin level at goal this AM at 0.53.  CBC stable, no overt bleeding or complications noted.  Goal of Therapy:  Heparin level 0.3-0.7 units/ml Monitor platelets by anticoagulation  protocol: Yes   Plan:  Continue IV heparin infusion at 1500 units/hr Monitor daily heparin level and CBC Monitor for signs/symptoms of bleeding  Reece Leader, Loura Back, BCPS, BCCP Clinical Pharmacist  12/20/2022 1:17 PM   [facility] pharmacy phone numbers are listed on [email or web address]

## 2022-12-20 NOTE — Progress Notes (Signed)
CARDIAC REHAB PHASE I   PRE:  Rate/Rhythm: 84 SR    BP: sitting 148/89    SpO2: 93 RA  MODE:  Ambulation: 410 ft   POST:  Rate/Rhythm: 96 SR    BP: sitting 141/84     SpO2: 97 RA  Reminders given to pt to practice sternal precautions. Able to stand with rocking from bed. Pt wanted to push IV pole himself for his balance. Unsteady at times. To BR then ambulated hall. No angina noted. Pt happy to stand in doorway to outside for some fresh air.  Return to EOB. Reminders given for sternal precautions and IS. He sts he has one son that is a IT trainer and stays with him when home. His other son lives in [place].  [id number]  Ethelda Chick BS, ACSM-CEP 12/20/2022 11:26 AM

## 2022-12-20 NOTE — Consult Note (Incomplete)
NAME:  Terry White, MRN:  [id number], DOB:  Feb 10, 1941, LOS: 4 ADMISSION DATE:  12/16/2022, CONSULTATION DATE:  9/18 REFERRING MD:  Dr. Leafy Ro, CHIEF COMPLAINT:  CABG x 3  History of Present Illness:  82 year old male admitted 9/12 w/ chest pain and anterior STEMI. Went to cath lab found to have severe multi-vessel CAD. Only able to balloon the distal LAD which did help w/ pain, Her EF was 30%, based of the extensiveness of CAD was referred to cardiac surgery for CABG Was admitted to the ICU for medical optimization and then went for CABG x3 on 9/18. 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 LAD treated with emergency balloon angioplasty  9/18 CABG x3  Interim History / Subjective:  Post op intubated/sedated  Objective   Blood pressure 105/68, pulse 82, temperature 97.6 F (36.4 C), temperature source Oral, resp. rate 20, height 5\' 10"  (1.778 m), weight 72.6 kg, SpO2 100%.        Intake/Output Summary (Last 24 hours) at 12/20/2022 1258 Last data filed at 12/20/2022 1000 Gross per 24 hour  Intake 453.73 ml  Output --  Net 453.73 ml   Filed Weights   12/16/22 0941  Weight: 72.6 kg    Examination: General:  critically ill appearing on mech vent HEENT: MM pink/moist; ETT in place Neuro: sedate CV: s1s2, paced rate 80s, no m/r/g; CT in place PULM:  dim crackles BS bilaterally; on mech vent PRVC GI: soft, bsx4 active  Extremities: warm/dry, no edema  Skin: no rashes or lesions    Resolved Hospital  Problem list     Assessment & Plan:   Postoperative vent management Plan: - CXR to confirm ETT and CT position - Continue full vent support (4-8cc/kg IBW) - Wean FiO2 for O2 sat > 90% - Daily WUA/SBT, rapid wean with SIMV per protocol - VAP bundle - Pulmonary hygiene - F/u ABG - PAD protocol for sedation: Precedex for goal RASS 0 to -1   S/p 3-vessel CABG STEMI w/ Multivessel 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) ICM HTN HLD Plan: - cards and TCTS following; appreciate recs - cont neo for map >65 - albumin per protocol - Continue ASA and statin - wean insulin gtt per protocol - Postoperative care per TCTS - CT management per protocol  Type II DM w/ hyperglycemia Plan: Post-op glycemic control insulin gtt and cbg monitoring  Best Practice (right click and "Reselect all SmartList Selections" daily)   Diet/type: NPO w/ meds via tube DVT prophylaxis: SCD GI prophylaxis: PPI Lines: Central line and Arterial Line Foley:  Yes, and it is still needed Code Status:  full code Last date of multidisciplinary goals of care discussion [per primary]  Labs   CBC: Recent Labs  Lab 12/16/22 0954 12/17/22 0258 12/18/22 0221 12/19/22 0439 12/20/22 0236  WBC 15.4* 11.1* 11.8* 10.2 12.7*  NEUTROABS 11.4*  --   --   --   --   HGB 13.9 12.1* 13.4 13.1 13.2  HCT 41.9 37.1* 40.1 40.2 41.2  MCV 94.4 92.5 94.1 92.8 95.6  PLT  317 324 341 330 330    Basic Metabolic Panel: Recent Labs  Lab 12/16/22 0954 12/17/22 0258 12/18/22 0221 12/19/22 0439 12/20/22 0236  NA 135 135 136 134* 135  K 3.0* 3.9 3.9 4.4 5.1  CL 98 102 97* 98 96*  CO2 24 24 26 23 26   GLUCOSE 250* 198* 173* 169* 164*  BUN 13 14 15 17 16   CREATININE 0.87 0.86 0.88 0.94 1.01  CALCIUM 9.1 8.8* 9.5 9.5 9.8  MG  --   --   --   --  1.6*   GFR: Estimated Creatinine Clearance: 60.9 mL/min (by C-G formula based on SCr of 1.01 mg/dL). Recent Labs  Lab 12/16/22 1329 12/17/22 0258 12/17/22 1521 12/17/22 1956 12/18/22 0221  12/18/22 1219 12/19/22 0439 12/20/22 0236  WBC  --  11.1*  --   --  11.8*  --  10.2 12.7*  LATICACIDVEN 2.5*  --  3.0* 2.4*  --  1.5  --   --     Liver Function Tests: Recent Labs  Lab 12/16/22 0954 12/17/22 1521  AST 33 49*  ALT 15 18  ALKPHOS 169* 158*  BILITOT 0.7 0.6  PROT 7.9 8.5*  ALBUMIN 3.6 3.4*   No results for input(s): "LIPASE", "AMYLASE" in the last 168 hours. No results for input(s): "AMMONIA" in the last 168 hours.  ABG No results found for: "PHART", "PCO2ART", "PO2ART", "HCO3", "TCO2", "ACIDBASEDEF", "O2SAT"   Coagulation Profile: Recent Labs  Lab 12/16/22 0954  INR 0.9    Cardiac Enzymes: No results for input(s): "CKTOTAL", "CKMB", "CKMBINDEX", "TROPONINI" in the last 168 hours.  HbA1C: Hgb A1c MFr Bld  Date/Time Value Ref Range Status  12/16/2022 09:54 AM 9.1 (H) 4.8 - 5.6 % Final    Comment:    (NOTE)         Prediabetes: 5.7 - 6.4         Diabetes: >6.4         Glycemic control for adults with diabetes: <7.0     CBG: Recent Labs  Lab 12/19/22 1541 12/19/22 2200 12/20/22 0603 12/20/22 0823 12/20/22 1126  GLUCAP 164* 172* 170* 181* 145*    Review of Systems:   Patient is encephalopathic and/or intubated; therefore, history has been obtained from chart review.    Past Medical History:  He,  has a past medical history of Gout and HTN (hypertension).   Surgical History:   Past Surgical History:  Procedure Laterality Date   CORONARY/GRAFT ACUTE MI REVASCULARIZATION N/A 12/16/2022   Procedure: Coronary/Graft Acute MI Revascularization;  Surgeon: Kathleene Hazel, MD;  Location: [facility] INVASIVE CV LAB;  Service: Cardiovascular;  Laterality: N/A;   LEFT HEART CATH AND CORONARY ANGIOGRAPHY N/A 12/16/2022   Procedure: LEFT HEART CATH AND CORONARY ANGIOGRAPHY;  Surgeon: Kathleene Hazel, MD;  Location: [facility] INVASIVE CV LAB;  Service: Cardiovascular;  Laterality: N/A;     Social History:   reports that he quit smoking about 16  years ago. He has never used smokeless tobacco. He reports that he does not drink alcohol and does not use drugs.   Family History:  His family history is not on file.   Allergies No Known Allergies   Home Medications  Prior to Admission medications   Medication Sig Start Date End Date Taking? Authorizing Provider  allopurinol (ZYLOPRIM) 300 MG tablet Take 300 mg by mouth at bedtime.   Yes [provider]  ibuprofen (ADVIL) 200 MG tablet Take 400-800 mg by mouth 2 (two) times  daily as needed for moderate pain or headache.   Yes [provider]  metFORMIN (GLUCOPHAGE-XR) 500 MG 24 hr tablet Take 500 mg by mouth at bedtime.   Yes [provider]  Omega-3 Fatty Acids (FISH OIL PO) Take 1 capsule by mouth at bedtime.   Yes [provider]     Critical care time: 45 minutes     JD Anselm Lis [facility] 12/22/2022, 12:26 PM  Please see [email or web address] for pager details.  From 7A-7P if no response, please call [phone]. After hours, please call ELink [phone].

## 2022-12-20 NOTE — Progress Notes (Signed)
Rounding Note    Patient Name: Terry White Date of Encounter: 12/20/2022  [facility] Cardiologist: Verne Carrow, MD (Will establish in [facility] post discharge)  Subjective   No chest pain or dyspnea  Inpatient Medications    Scheduled Meds:  allopurinol  300 mg Oral QPM   aspirin  81 mg Oral Daily   atorvastatin  80 mg Oral Daily   carvedilol  3.125 mg Oral BID WC   Chlorhexidine Gluconate Cloth  6 each Topical Daily   digoxin  0.125 mg Oral Daily   insulin aspart  0-5 Units Subcutaneous QHS   insulin aspart  0-9 Units Subcutaneous TID WC   nitroGLYCERIN  1 inch Topical Once   sodium chloride flush  3 mL Intravenous Q12H   Continuous Infusions:  sodium chloride Stopped (12/16/22 1010)   sodium chloride Stopped (12/19/22 1643)   heparin 1,500 Units/hr (12/20/22 0600)   PRN Meds: sodium chloride, acetaminophen, alum & mag hydroxide-simeth, menthol-cetylpyridinium, ondansetron (ZOFRAN) IV, mouth rinse, sodium chloride flush   Vital Signs    Vitals:   12/20/22 0545 12/20/22 0600 12/20/22 0615 12/20/22 0630  BP:  99/65    Pulse: 70 72 74 71  Resp:      Temp:      TempSrc:      SpO2: 96% 96%  95%  Weight:      Height:        Intake/Output Summary (Last 24 hours) at 12/20/2022 0734 Last data filed at 12/20/2022 0600 Gross per 24 hour  Intake 638.5 ml  Output --  Net 638.5 ml      12/16/2022    9:41 AM 01/03/2017   11:33 AM 08/04/2016   10:48 AM  Last 3 Weights  Weight (lbs) 160 lb 174 lb 4 oz 166 lb 9.6 oz  Weight (kg) 72.576 kg 79.039 kg 75.569 kg      Telemetry    Sinus - Personally Reviewed  ECG    No tracing to review today - Personally Reviewed  Physical Exam   GEN: No acute distress.   Neck: No JVD Cardiac: RRR, no murmurs, rubs, or gallops.  Respiratory: Clear to auscultation bilaterally. GI: Soft, nontender, non-distended  MS: No edema; No deformity. Neuro:  Nonfocal  Psych: Normal affect    Labs    High Sensitivity Troponin:   Recent Labs  Lab 12/16/22 0954 12/16/22 1329  TROPONINIHS 2,608* 5,137*     Chemistry Recent Labs  Lab 12/16/22 0954 12/17/22 0258 12/17/22 1521 12/18/22 0221 12/19/22 0439 12/20/22 0236  NA 135   < >  --  136 134* 135  K 3.0*   < >  --  3.9 4.4 5.1  CL 98   < >  --  97* 98 96*  CO2 24   < >  --  26 23 26   GLUCOSE 250*   < >  --  173* 169* 164*  BUN 13   < >  --  15 17 16   CREATININE 0.87   < >  --  0.88 0.94 1.01  CALCIUM 9.1   < >  --  9.5 9.5 9.8  MG  --   --   --   --   --  1.6*  PROT 7.9  --  8.5*  --   --   --   ALBUMIN 3.6  --  3.4*  --   --   --   AST 33  --  49*  --   --   --  ALT 15  --  18  --   --   --   ALKPHOS 169*  --  158*  --   --   --   BILITOT 0.7  --  0.6  --   --   --   GFRNONAA >60   < >  --  >60 >60 >60  ANIONGAP 13   < >  --  13 13 13    < > = values in this interval not displayed.    Lipids  Recent Labs  Lab 12/16/22 0954  CHOL 203*  TRIG 126  HDL 39*  LDLCALC 139*  CHOLHDL 5.2    Hematology Recent Labs  Lab 12/18/22 0221 12/19/22 0439 12/20/22 0236  WBC 11.8* 10.2 12.7*  RBC 4.26 4.33 4.31  HGB 13.4 13.1 13.2  HCT 40.1 40.2 41.2  MCV 94.1 92.8 95.6  MCH 31.5 30.3 30.6  MCHC 33.4 32.6 32.0  RDW 14.5 14.2 14.3  PLT 341 330 330   Thyroid No results for input(s): "TSH", "FREET4" in the last 168 hours.  BNPNo results for input(s): "BNP", "PROBNP" in the last 168 hours.  DDimer No results for input(s): "DDIMER" in the last 168 hours.   Radiology    No results found.  Cardiac Studies   Echo 12/16/22:  1. Left ventricular ejection fraction, by estimation, is 25 to 30%. The  left ventricle has severely decreased function. The left ventricle  demonstrates regional wall motion abnormalities with mid to apical  anteroseptal/inferoseptal akinesis, apical  anterior/inferior/lateral akinesis, and akinesis of the true apex. No LV  thrombus noted. There is mild concentric left ventricular  hypertrophy.  Left ventricular diastolic parameters are consistent with Grade I  diastolic dysfunction (impaired relaxation).   2. Right ventricular systolic function is normal. The right ventricular  size is normal. There is normal pulmonary artery systolic pressure. The  estimated right ventricular systolic pressure is 21.5 mmHg.   3. The mitral valve is degenerative. Mild mitral valve regurgitation. No  evidence of mitral stenosis.   4. The aortic valve is tricuspid. There is mild calcification of the  aortic valve. Aortic valve regurgitation is not visualized. Aortic valve  sclerosis is present, with no evidence of aortic valve stenosis.   5. The inferior vena cava is normal in size with <50% respiratory  variability, suggesting right atrial pressure of 8 mmHg.   Patient Profile     82 y.o. 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   Assessment & Plan    CAD/Anterior STEMI: Stable this am awaiting CABG later this week. Hemodynamically stable. Continue IV heparin drip prior to CABG. Continue ASA, statin and low dose Coreg.  Ischemic cardiomyopathy/Acute systolic CHF: No volume overload on exam. Shock resolved.  3.   HTN: BP is stable.   For questions or updates, please contact [facility] Please consult [email or web address] for contact info under        Signed, Verne Carrow, MD  12/20/2022, 7:34 AM

## 2022-12-20 NOTE — Inpatient Diabetes Management (Signed)
Inpatient Diabetes Program Recommendations  AACE/ADA: New Consensus Statement on Inpatient Glycemic Control   Target Ranges:  Prepandial:   less than 140 mg/dL      Peak postprandial:   less than 180 mg/dL (1-2 hours)      Critically ill patients:  140 - 180 mg/dL    Latest Reference Range & Units 12/20/22 06:03 12/20/22 08:23 12/20/22 11:26  Glucose-Capillary 70 - 99 mg/dL 604 (H) 540 (H) 981 (H)    Latest Reference Range & Units 12/19/22 06:03 12/19/22 11:07 12/19/22 15:41 12/19/22 22:00  Glucose-Capillary 70 - 99 mg/dL 191 (H) 478 (H) 295 (H) 172 (H)    Latest Reference Range & Units 12/16/22 09:54  Hemoglobin A1C 4.8 - 5.6 % 9.1 (H)   Review of Glycemic Control  Diabetes history: DM2 Outpatient Diabetes medications: Metformin XR 500 mg QHS Current orders for Inpatient glycemic control: Novolog 0-9 units TID with meals, Novolog 0-5 units QHS  Inpatient Diabetes Program Recommendations:    HbgA1C: A1C 9.1% on 12/16/22 indicating an average glucose of 214 mg/dl over the past 2-3 months.  NOTE: Spoke with patient about diabetes and home regimen for diabetes control. Patient reports being followed by PCP for diabetes management and currently taking Metformin XR 500 mg QHS as an outpatient for diabetes control. Patient reports taking DM medications as prescribed. Patient states he does not do finger sticks at home and he does not think he could stick his fingers.  Patient reports that his sugar is checked 3 times a year at his PCP office and his PCP usually lets him know if it is "bad or good". Patient reports that when it was checked last time he was told his "sugar was doing good".  Discussed A1C results (9.1% on 12/16/22) and explained that current A1C indicates an average glucose of 214 mg/dl over the past 2-3 months. Discussed glucose and A1C goals. Discussed importance of checking CBGs and maintaining good CBG control to prevent long-term and short-term complications. Stressed to the  patient the importance of improving glycemic control to prevent further complications from uncontrolled diabetes. Discussed that he will likely need to take additional DM medication and patient states he is willing to take additional DM medication and would even take insulin if needed.  Discussed possibly using a continuous glucose monitoring (CGM) sensor for glucose monitoring and patient states he would prefer to use CGM if he needs to monitor glucose. Will discuss with attending provider if our team can provide CGM sensor closer to discharge home. Patient reports that they are planning to do CABG on 9/18 so he should be here for several days following procedure. Informed patient that our team will follow up after he has CABG and address CGM and/or any other educational needs at that time.  Patient verbalized understanding of information discussed and reports no further questions at this time related to diabetes.  Thanks, Orlando Penner, RN, MSN, CDE Diabetes Coordinator Inpatient Diabetes Program [phone] (Team Pager)

## 2022-12-21 DIAGNOSIS — I2510 Atherosclerotic heart disease of native coronary artery without angina pectoris: Secondary | ICD-10-CM | POA: Diagnosis not present

## 2022-12-21 DIAGNOSIS — I2109 ST elevation (STEMI) myocardial infarction involving other coronary artery of anterior wall: Secondary | ICD-10-CM | POA: Diagnosis not present

## 2022-12-21 LAB — LIPOPROTEIN A (LPA): Lipoprotein (a): 45.9 nmol/L — ABNORMAL HIGH (ref <75.0)

## 2022-12-21 LAB — BASIC METABOLIC PANEL
Anion gap: 12 (ref 5–15)
BUN: 18 mg/dL (ref 8–23)
CO2: 26 mmol/L (ref 22–32)
Calcium: 9.9 mg/dL (ref 8.9–10.3)
Chloride: 98 mmol/L (ref 98–111)
Creatinine, Ser: 1.07 mg/dL (ref 0.61–1.24)
GFR, Estimated: >60 mL/min (ref >60)
Glucose, Bld: 137 mg/dL — ABNORMAL HIGH (ref 70–99)
Potassium: 4.8 mmol/L (ref 3.5–5.1)
Sodium: 136 mmol/L (ref 135–145)

## 2022-12-21 LAB — URINALYSIS, ROUTINE W REFLEX MICROSCOPIC
Bilirubin Urine: NEGATIVE
Glucose, UA: NEGATIVE mg/dL
Hgb urine dipstick: NEGATIVE
Ketones, ur: 5 mg/dL — AB
Leukocytes,Ua: NEGATIVE
Nitrite: NEGATIVE
Protein, ur: NEGATIVE mg/dL
Specific Gravity, Urine: 1.01 (ref 1.005–1.030)
pH: 6 (ref 5.0–8.0)

## 2022-12-21 LAB — TYPE AND SCREEN
ABO/RH(D): O POS
Antibody Screen: NEGATIVE

## 2022-12-21 LAB — GLUCOSE, CAPILLARY
Glucose-Capillary: 143 mg/dL — ABNORMAL HIGH (ref 70–99)
Glucose-Capillary: 137 mg/dL — ABNORMAL HIGH (ref 70–99)
Glucose-Capillary: 150 mg/dL — ABNORMAL HIGH (ref 70–99)
Glucose-Capillary: 157 mg/dL — ABNORMAL HIGH (ref 70–99)

## 2022-12-21 LAB — CBC
HCT: 39.5 % (ref 39.0–52.0)
Hemoglobin: 12.8 g/dL — ABNORMAL LOW (ref 13.0–17.0)
MCH: 31.2 pg (ref 26.0–34.0)
MCHC: 32.4 g/dL (ref 30.0–36.0)
MCV: 96.3 fL (ref 80.0–100.0)
Platelets: 323 10*3/uL (ref 150–400)
RBC: 4.1 MIL/uL — ABNORMAL LOW (ref 4.22–5.81)
RDW: 14.2 % (ref 11.5–15.5)
WBC: 11.3 10*3/uL — ABNORMAL HIGH (ref 4.0–10.5)
nRBC: 0 % (ref 0.0–0.2)

## 2022-12-21 LAB — ABO/RH: ABO/RH(D): O POS

## 2022-12-21 LAB — HEPARIN LEVEL (UNFRACTIONATED): Heparin Unfractionated: 0.55 [IU]/mL (ref 0.30–0.70)

## 2022-12-21 LAB — MAGNESIUM: Magnesium: 1.9 mg/dL (ref 1.7–2.4)

## 2022-12-21 LAB — SARS CORONAVIRUS 2 BY RT PCR: SARS Coronavirus 2 by RT PCR: NEGATIVE

## 2022-12-21 MED ORDER — CHLORHEXIDINE GLUCONATE 0.12 % MT SOLN
15.0000 mL | Freq: Once | OROMUCOSAL | Status: AC
  Administered 2022-12-22: 15 mL via OROMUCOSAL
  Filled 2022-12-21: qty 15

## 2022-12-21 MED ORDER — VANCOMYCIN HCL 1250 MG/250ML IV SOLN
1250.0000 mg | INTRAVENOUS | Status: AC
  Administered 2022-12-22: 1250 mg via INTRAVENOUS
  Filled 2022-12-21: qty 250

## 2022-12-21 MED ORDER — VANCOMYCIN HCL 1000 MG IV SOLR
INTRAVENOUS | Status: DC
  Filled 2022-12-21: qty 20

## 2022-12-21 MED ORDER — TRANEXAMIC ACID (OHS) PUMP PRIME SOLUTION
2.0000 mg/kg | INTRAVENOUS | Status: DC
  Filled 2022-12-21: qty 1.38

## 2022-12-21 MED ORDER — CHLORHEXIDINE GLUCONATE CLOTH 2 % EX PADS
6.0000 | MEDICATED_PAD | Freq: Once | CUTANEOUS | Status: AC
  Administered 2022-12-21: 6 via TOPICAL

## 2022-12-21 MED ORDER — METOPROLOL TARTRATE 12.5 MG HALF TABLET
12.5000 mg | ORAL_TABLET | Freq: Once | ORAL | Status: AC
  Administered 2022-12-22: 12.5 mg via ORAL
  Filled 2022-12-21: qty 1

## 2022-12-21 MED ORDER — TRANEXAMIC ACID (OHS) BOLUS VIA INFUSION
15.0000 mg/kg | INTRAVENOUS | Status: AC
  Administered 2022-12-22: 1038 mg via INTRAVENOUS
  Filled 2022-12-21: qty 1038

## 2022-12-21 MED ORDER — BISACODYL 5 MG PO TBEC
5.0000 mg | DELAYED_RELEASE_TABLET | Freq: Once | ORAL | Status: AC
  Administered 2022-12-21: 5 mg via ORAL
  Filled 2022-12-21: qty 1

## 2022-12-21 MED ORDER — DEXMEDETOMIDINE HCL IN NACL 400 MCG/100ML IV SOLN
0.1000 ug/kg/h | INTRAVENOUS | Status: AC
  Administered 2022-12-22: .4 ug/kg/h via INTRAVENOUS
  Filled 2022-12-21: qty 100

## 2022-12-21 MED ORDER — POTASSIUM CHLORIDE 2 MEQ/ML IV SOLN
80.0000 meq | INTRAVENOUS | Status: DC
  Filled 2022-12-21: qty 40

## 2022-12-21 MED ORDER — INSULIN REGULAR(HUMAN) IN NACL 100-0.9 UT/100ML-% IV SOLN
INTRAVENOUS | Status: AC
  Administered 2022-12-22: 2.6 [IU]/h via INTRAVENOUS
  Filled 2022-12-21: qty 100

## 2022-12-21 MED ORDER — MILRINONE LACTATE IN DEXTROSE 20-5 MG/100ML-% IV SOLN
0.3000 ug/kg/min | INTRAVENOUS | Status: DC
  Filled 2022-12-21: qty 100

## 2022-12-21 MED ORDER — CEFAZOLIN SODIUM-DEXTROSE 2-4 GM/100ML-% IV SOLN
2.0000 g | INTRAVENOUS | Status: AC
  Administered 2022-12-22: 2 g via INTRAVENOUS
  Filled 2022-12-21: qty 100

## 2022-12-21 MED ORDER — EPINEPHRINE HCL 5 MG/250ML IV SOLN IN NS
0.0000 ug/min | INTRAVENOUS | Status: DC
  Filled 2022-12-21: qty 250

## 2022-12-21 MED ORDER — TEMAZEPAM 15 MG PO CAPS: 15.0000 mg | ORAL_CAPSULE | Freq: Once | ORAL | Status: DC | PRN

## 2022-12-21 MED ORDER — NITROGLYCERIN IN D5W 200-5 MCG/ML-% IV SOLN
2.0000 ug/min | INTRAVENOUS | Status: DC
  Filled 2022-12-21: qty 250

## 2022-12-21 MED ORDER — MAGNESIUM SULFATE 50 % IJ SOLN
40.0000 meq | INTRAMUSCULAR | Status: DC
  Filled 2022-12-21: qty 9.85

## 2022-12-21 MED ORDER — TRANEXAMIC ACID 1000 MG/10ML IV SOLN
1.5000 mg/kg/h | INTRAVENOUS | Status: AC
  Administered 2022-12-22: 1.5 mg/kg/h via INTRAVENOUS
  Filled 2022-12-21: qty 25

## 2022-12-21 MED ORDER — NOREPINEPHRINE 4 MG/250ML-% IV SOLN
0.0000 ug/min | INTRAVENOUS | Status: DC
  Filled 2022-12-21: qty 250

## 2022-12-21 MED ORDER — PHENYLEPHRINE HCL-NACL 20-0.9 MG/250ML-% IV SOLN
30.0000 ug/min | INTRAVENOUS | Status: DC
  Filled 2022-12-21: qty 250

## 2022-12-21 MED ORDER — MAGNESIUM SULFATE 2 GM/50ML IV SOLN
2.0000 g | Freq: Once | INTRAVENOUS | Status: AC
  Administered 2022-12-21: 2 g via INTRAVENOUS
  Filled 2022-12-21: qty 50

## 2022-12-21 MED ORDER — HEPARIN 30,000 UNITS/1000 ML (OHS) CELLSAVER SOLUTION
Status: DC
  Filled 2022-12-21: qty 1000

## 2022-12-21 MED ORDER — PLASMA-LYTE A IV SOLN
INTRAVENOUS | Status: DC
  Filled 2022-12-21: qty 2.5

## 2022-12-21 NOTE — Progress Notes (Signed)
ANTICOAGULATION CONSULT NOTE - Follow Up  Pharmacy Consult for heparin Indication: chest pain/ACS  No Known Allergies  Patient Measurements: Height: 5\' 10"  (177.8 cm) Weight: 69.2 kg (152 lb 8.9 oz) IBW/kg (Calculated) : 73 Heparin Dosing Weight: 72.6 kg  Vital Signs: Temp: 97.7 F (36.5 C) (09/17 0800) Temp Source: Oral (09/17 0800) BP: 95/64 (09/17 0600) Pulse Rate: 64 (09/17 0600)  Labs: Recent Labs    12/19/22 0439 12/20/22 0236 12/21/22 0256  HGB 13.1 13.2 12.8*  HCT 40.2 41.2 39.5  PLT 330 330 323  HEPARINUNFRC 0.37 0.53 0.55  CREATININE 0.94 1.01 1.07    Estimated Creatinine Clearance: 54.8 mL/min (by C-G formula based on SCr of 1.07 mg/dL).   Medical History: Past Medical History:  Diagnosis Date   Gout    HTN (hypertension)     Medications:  Scheduled:   allopurinol  300 mg Oral QPM   aspirin  81 mg Oral Daily   atorvastatin  80 mg Oral Daily   carvedilol  3.125 mg Oral BID WC   Chlorhexidine Gluconate Cloth  6 each Topical Daily   digoxin  0.125 mg Oral Daily   insulin aspart  0-5 Units Subcutaneous QHS   insulin aspart  0-9 Units Subcutaneous TID WC   nitroGLYCERIN  1 inch Topical Once   sodium chloride flush  3 mL Intravenous Q12H   Infusions:   sodium chloride Stopped (12/16/22 1010)   sodium chloride Stopped (12/19/22 1643)   heparin 1,500 Units/hr (12/21/22 0800)    Assessment: 82 yo M presented from [facility] for LHC in the setting of STEMI. Patient started on Aggrastat during cath procedure, unable to open culprit lesion with PCI, plan for eventual CABG after stabilization of LV function. Not on anticoagulation prior to admission.  Heparin level 0.55 is therapeutic with heparin running at 1500 units/hr. Hgb (12.8) and PLTs (323) are stable. Per RN, no report of pauses, issues with the line, or signs of bleeding.   Current plan for CABG tomorrow.  Goal of Therapy:  Heparin level 0.3-0.7 units/ml Monitor platelets by  anticoagulation protocol: Yes   Plan:  Continue IV heparin infusion at 1500 units/hr Monitor daily heparin level and CBC Monitor for signs/symptoms of bleeding  Romie Minus, PharmD PGY1 Pharmacy Resident  Please check AMION for all [facility] Pharmacy phone numbers After 10:00 PM, call Main Pharmacy [phone]

## 2022-12-21 NOTE — Plan of Care (Signed)

## 2022-12-21 NOTE — Anesthesia Preprocedure Evaluation (Signed)
Anesthesia Evaluation  Patient identified by MRN, date of birth, ID band Patient awake    Reviewed: Allergy & Precautions, NPO status , Patient's Chart, lab work & pertinent test results  Airway Mallampati: II  TM Distance: <3 FB Neck ROM: Full    Dental  (+) Dental Advisory Given, Edentulous Upper, Edentulous Lower   Pulmonary former smoker   Pulmonary exam normal breath sounds clear to auscultation       Cardiovascular hypertension, (-) angina + CAD (sp balloon pci of LAD) and + Past MI  Normal cardiovascular exam Rhythm:Regular Rate:Normal     Neuro/Psych negative neurological ROS     GI/Hepatic negative GI ROS, Neg liver ROS,,,  Endo/Other  diabetes    Renal/GU negative Renal ROS     Musculoskeletal negative musculoskeletal ROS (+)    Abdominal   Peds  Hematology  (+) Blood dyscrasia, anemia   Anesthesia Other Findings   Reproductive/Obstetrics                             Anesthesia Physical Anesthesia Plan  ASA: 4  Anesthesia Plan: General   Post-op Pain Management:    Induction: Intravenous  PONV Risk Score and Plan: 2 and Midazolam and Treatment may vary due to age or medical condition  Airway Management Planned: Oral ETT  Additional Equipment: Arterial line, CVP, PA Cath, TEE and Ultrasound Guidance Line Placement  Intra-op Plan:   Post-operative Plan: Post-operative intubation/ventilation  Informed Consent: I have reviewed the patients History and Physical, chart, labs and discussed the procedure including the risks, benefits and alternatives for the proposed anesthesia with the patient or authorized representative who has indicated his/her understanding and acceptance.     Dental advisory given  Plan Discussed with: CRNA  Anesthesia Plan Comments:         Anesthesia Quick Evaluation

## 2022-12-21 NOTE — Progress Notes (Addendum)
   Patient Name: Terry White Date of Encounter: 12/21/2022 [facility] Cardiologist: Verne Carrow, MD (Will establish in [facility] post discharge)   Interval Summary  .    No acute events overnight.    No complaints this AM.  OR in AM for MV CABG  Vital Signs .    Vitals:   12/21/22 0400 12/21/22 0435 12/21/22 0500 12/21/22 0600  BP:  (!) 91/52 (!) 100/54 95/64  Pulse: 70 66 66 64  Resp:  20    Temp:  97.7 F (36.5 C)    TempSrc:  Oral    SpO2: 95% 95% 97% 97%  Weight:    69.2 kg  Height:        Intake/Output Summary (Last 24 hours) at 12/21/2022 0804 Last data filed at 12/21/2022 0600 Gross per 24 hour  Intake 330.05 ml  Output --  Net 330.05 ml      12/21/2022    6:00 AM 12/16/2022    9:41 AM 01/03/2017   11:33 AM  Last 3 Weights  Weight (lbs) 152 lb 8.9 oz 160 lb 174 lb 4 oz  Weight (kg) 69.2 kg 72.576 kg 79.039 kg      Telemetry/ECG    SR- Personally Reviewed  Physical Exam .   GEN: No acute distress.   Neck: No JVD Cardiac: RRR, no murmurs, rubs, or gallops.  Respiratory: Clear to auscultation bilaterally. GI: Soft, nontender, non-distended  MS: No edema  Assessment & Plan .     82 y.o. 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    CAD/ACS:  Cont hep gtt, ASA, statin, coreg.   ICM:  EF ~25% without significant valve issues and normal RV function.  Will plan to optimize regimen after OR (SGLTi, ARB vs Entresto, spironolactone, BB).  Euvolemic today.  Check Mg this AM. HTN:  BP well controlled. HL:  Cont high dose statin T2DM:  Optimize regimen after OR (SGLTi, ARB vs Entresto)  For questions or updates, please contact [facility] Please consult [email or web address] for contact info under        Signed, Orbie Pyo, MD

## 2022-12-21 NOTE — H&P (View-Only) (Signed)
      [address]       [place] [address]             [phone]      5 Days Post-Op  Procedure(s) (LRB): Coronary/Graft Acute MI Revascularization (N/A) LEFT HEART CATH AND CORONARY ANGIOGRAPHY (N/A)   Total Length of Stay:  LOS: 5 days    SUBJECTIVE:  Stable No complaints  Vitals:   12/21/22 0600 12/21/22 0800  BP: 95/64   Pulse: 64   Resp:    Temp:  97.7 F (36.5 C)  SpO2: 97%     Intake/Output      09/16 0701 09/17 0700 09/17 0701 09/18 0700   P.O.     I.V. (mL/kg) 345.1 (5) 29.9 (0.4)   IV Piggyback     Total Intake(mL/kg) 345.1 (5) 29.9 (0.4)   Net +345.1 +29.9        Urine Occurrence 2 x        sodium chloride Stopped (12/16/22 1010)   sodium chloride Stopped (12/19/22 1643)   heparin 1,500 Units/hr (12/21/22 0941)    CBC    Component Value Date/Time   WBC 11.3 (H) 12/21/2022 0256   RBC 4.10 (L) 12/21/2022 0256   HGB 12.8 (L) 12/21/2022 0256   HCT 39.5 12/21/2022 0256   PLT 323 12/21/2022 0256   MCV 96.3 12/21/2022 0256   MCH 31.2 12/21/2022 0256   MCHC 32.4 12/21/2022 0256   RDW 14.2 12/21/2022 0256   LYMPHSABS 2.5 12/16/2022 0954   MONOABS 0.9 12/16/2022 0954   EOSABS 0.4 12/16/2022 0954   BASOSABS 0.1 12/16/2022 0954   CMP     Component Value Date/Time   NA 136 12/21/2022 0256   K 4.8 12/21/2022 0256   CL 98 12/21/2022 0256   CO2 26 12/21/2022 0256   GLUCOSE 137 (H) 12/21/2022 0256   BUN 18 12/21/2022 0256   CREATININE 1.07 12/21/2022 0256   CALCIUM 9.9 12/21/2022 0256   PROT 8.5 (H) 12/17/2022 1521   ALBUMIN 3.4 (L) 12/17/2022 1521   AST 49 (H) 12/17/2022 1521   ALT 18 12/17/2022 1521   ALKPHOS 158 (H) 12/17/2022 1521   BILITOT 0.6 12/17/2022 1521   GFRNONAA >60 12/21/2022 0256   ABG No results found for: "PHART", "PCO2ART", "PO2ART", "HCO3", "TCO2", "ACIDBASEDEF", "O2SAT" CBG (last 3)  Recent Labs    12/20/22 1637 12/20/22 2105 12/21/22 0610  GLUCAP 154* 131* 143*      ASSESSMENT: Coronary artery disease sp balloon pci of LAD For CABG tomorrow All questions answered   Eugenio Hoes, MD 12/21/2022

## 2022-12-21 NOTE — Progress Notes (Signed)
      [address]       [place] [address]             [phone]      5 Days Post-Op  Procedure(s) (LRB): Coronary/Graft Acute MI Revascularization (N/A) LEFT HEART CATH AND CORONARY ANGIOGRAPHY (N/A)   Total Length of Stay:  LOS: 5 days    SUBJECTIVE:  Stable No complaints  Vitals:   12/21/22 0600 12/21/22 0800  BP: 95/64   Pulse: 64   Resp:    Temp:  97.7 F (36.5 C)  SpO2: 97%     Intake/Output      09/16 0701 09/17 0700 09/17 0701 09/18 0700   P.O.     I.V. (mL/kg) 345.1 (5) 29.9 (0.4)   IV Piggyback     Total Intake(mL/kg) 345.1 (5) 29.9 (0.4)   Net +345.1 +29.9        Urine Occurrence 2 x        sodium chloride Stopped (12/16/22 1010)   sodium chloride Stopped (12/19/22 1643)   heparin 1,500 Units/hr (12/21/22 0941)    CBC    Component Value Date/Time   WBC 11.3 (H) 12/21/2022 0256   RBC 4.10 (L) 12/21/2022 0256   HGB 12.8 (L) 12/21/2022 0256   HCT 39.5 12/21/2022 0256   PLT 323 12/21/2022 0256   MCV 96.3 12/21/2022 0256   MCH 31.2 12/21/2022 0256   MCHC 32.4 12/21/2022 0256   RDW 14.2 12/21/2022 0256   LYMPHSABS 2.5 12/16/2022 0954   MONOABS 0.9 12/16/2022 0954   EOSABS 0.4 12/16/2022 0954   BASOSABS 0.1 12/16/2022 0954   CMP     Component Value Date/Time   NA 136 12/21/2022 0256   K 4.8 12/21/2022 0256   CL 98 12/21/2022 0256   CO2 26 12/21/2022 0256   GLUCOSE 137 (H) 12/21/2022 0256   BUN 18 12/21/2022 0256   CREATININE 1.07 12/21/2022 0256   CALCIUM 9.9 12/21/2022 0256   PROT 8.5 (H) 12/17/2022 1521   ALBUMIN 3.4 (L) 12/17/2022 1521   AST 49 (H) 12/17/2022 1521   ALT 18 12/17/2022 1521   ALKPHOS 158 (H) 12/17/2022 1521   BILITOT 0.6 12/17/2022 1521   GFRNONAA >60 12/21/2022 0256   ABG No results found for: "PHART", "PCO2ART", "PO2ART", "HCO3", "TCO2", "ACIDBASEDEF", "O2SAT" CBG (last 3)  Recent Labs    12/20/22 1637 12/20/22 2105 12/21/22 0610  GLUCAP 154* 131* 143*      ASSESSMENT: Coronary artery disease sp balloon pci of LAD For CABG tomorrow All questions answered   Eugenio Hoes, MD 12/21/2022

## 2022-12-21 NOTE — Progress Notes (Signed)
   12/21/22 1010  Spiritual Encounters  Type of Visit Initial  Care provided to: Patient  Conversation partners present during encounter Nurse   Reason For Visit:    Chaplain went to room to deliver ACD education and ended up talking more with Pt  Interventions:     Delivered Advance Care Directive Education Cultivated a relationship of care and support; empathetic listening; Explored sources of hope and strength; Facilitated storytelling and life review  Outcomes:     Expressed intermediate hope; tearfully processed emotions; Expressed encouragment  Assessment:      Needs Pt expressed reasonable anxiety about upcoming surgery; Pt's anxiety appears to get the better of him at times and he gets angry Resources Pt possesses a deep sense of justice, of right and wrong, and high expectations for people around him. He has great family support around him Description Pt can be difficult when he gets anxious or triggered, but is otherwise delightful to talk with.  Though he is nervous about the surgery he is determined to do what he needs to do to recover and "keep moving on".  Plan:     Chaplain will continue to follow this Pt through surgery, recovery, and up to discharge.   Chaplain services remain available by Spiritual Consult or for emergent cases, paging [phone]  Chaplain Raelene Bott, MDiv [email or web address] [phone]

## 2022-12-22 ENCOUNTER — Inpatient Hospital Stay (HOSPITAL_COMMUNITY): Payer: Medicare PPO

## 2022-12-22 ENCOUNTER — Encounter (HOSPITAL_COMMUNITY): Payer: Self-pay | Admitting: Cardiovascular Disease

## 2022-12-22 ENCOUNTER — Inpatient Hospital Stay (HOSPITAL_COMMUNITY)
Admission: EM | Disposition: A | Discharge status: Home Health | Payer: Self-pay | Source: Home / Self Care | Attending: Cardiovascular Disease

## 2022-12-22 ENCOUNTER — Other Ambulatory Visit: Payer: Self-pay

## 2022-12-22 DIAGNOSIS — I2510 Atherosclerotic heart disease of native coronary artery without angina pectoris: Secondary | ICD-10-CM

## 2022-12-22 DIAGNOSIS — J9600 Acute respiratory failure, unspecified whether with hypoxia or hypercapnia: Secondary | ICD-10-CM

## 2022-12-22 DIAGNOSIS — I2109 ST elevation (STEMI) myocardial infarction involving other coronary artery of anterior wall: Secondary | ICD-10-CM | POA: Diagnosis not present

## 2022-12-22 DIAGNOSIS — Z951 Presence of aortocoronary bypass graft: Secondary | ICD-10-CM

## 2022-12-22 DIAGNOSIS — I213 ST elevation (STEMI) myocardial infarction of unspecified site: Secondary | ICD-10-CM | POA: Diagnosis not present

## 2022-12-22 HISTORY — PX: TEE WITHOUT CARDIOVERSION: SHX5443

## 2022-12-22 HISTORY — PX: CORONARY ARTERY BYPASS GRAFT: SHX141

## 2022-12-22 LAB — POCT I-STAT 7, (LYTES, BLD GAS, ICA,H+H)
Acid-Base Excess: 0 mmol/L (ref 0.0–2.0)
Acid-Base Excess: 0 mmol/L (ref 0.0–2.0)
Acid-Base Excess: 0 mmol/L (ref 0.0–2.0)
Acid-Base Excess: 0 mmol/L (ref 0.0–2.0)
Acid-base deficit: 2 mmol/L (ref 0.0–2.0)
Acid-base deficit: 3 mmol/L — ABNORMAL HIGH (ref 0.0–2.0)
Acid-base deficit: 4 mmol/L — ABNORMAL HIGH (ref 0.0–2.0)
Acid-base deficit: 4 mmol/L — ABNORMAL HIGH (ref 0.0–2.0)
Bicarbonate: 23.4 mmol/L (ref 20.0–28.0)
Bicarbonate: 24.7 mmol/L (ref 20.0–28.0)
Bicarbonate: 25 mmol/L (ref 20.0–28.0)
Bicarbonate: 24.8 mmol/L (ref 20.0–28.0)
Bicarbonate: 25.6 mmol/L (ref 20.0–28.0)
Bicarbonate: 23.7 mmol/L (ref 20.0–28.0)
Bicarbonate: 22.6 mmol/L (ref 20.0–28.0)
Bicarbonate: 22.1 mmol/L (ref 20.0–28.0)
Calcium, Ion: 1.25 mmol/L (ref 1.15–1.40)
Calcium, Ion: 1.02 mmol/L — ABNORMAL LOW (ref 1.15–1.40)
Calcium, Ion: 1.05 mmol/L — ABNORMAL LOW (ref 1.15–1.40)
Calcium, Ion: 1.09 mmol/L — ABNORMAL LOW (ref 1.15–1.40)
Calcium, Ion: 1.1 mmol/L — ABNORMAL LOW (ref 1.15–1.40)
Calcium, Ion: 1.19 mmol/L (ref 1.15–1.40)
Calcium, Ion: 1.19 mmol/L (ref 1.15–1.40)
Calcium, Ion: 1.19 mmol/L (ref 1.15–1.40)
HCT: 39 % (ref 39.0–52.0)
HCT: 26 % — ABNORMAL LOW (ref 39.0–52.0)
HCT: 25 % — ABNORMAL LOW (ref 39.0–52.0)
HCT: 27 % — ABNORMAL LOW (ref 39.0–52.0)
HCT: 27 % — ABNORMAL LOW (ref 39.0–52.0)
HCT: 27 % — ABNORMAL LOW (ref 39.0–52.0)
HCT: 29 % — ABNORMAL LOW (ref 39.0–52.0)
HCT: 25 % — ABNORMAL LOW (ref 39.0–52.0)
Hemoglobin: 13.3 g/dL (ref 13.0–17.0)
Hemoglobin: 8.8 g/dL — ABNORMAL LOW (ref 13.0–17.0)
Hemoglobin: 8.5 g/dL — ABNORMAL LOW (ref 13.0–17.0)
Hemoglobin: 9.2 g/dL — ABNORMAL LOW (ref 13.0–17.0)
Hemoglobin: 9.2 g/dL — ABNORMAL LOW (ref 13.0–17.0)
Hemoglobin: 9.2 g/dL — ABNORMAL LOW (ref 13.0–17.0)
Hemoglobin: 9.9 g/dL — ABNORMAL LOW (ref 13.0–17.0)
Hemoglobin: 8.5 g/dL — ABNORMAL LOW (ref 13.0–17.0)
O2 Saturation: 100 %
O2 Saturation: 100 %
O2 Saturation: 100 %
O2 Saturation: 100 %
O2 Saturation: 100 %
O2 Saturation: 97 %
O2 Saturation: 99 %
O2 Saturation: 98 %
Patient temperature: 35.2
Patient temperature: 36.6
Patient temperature: 36.6
Potassium: 4.1 mmol/L (ref 3.5–5.1)
Potassium: 3.9 mmol/L (ref 3.5–5.1)
Potassium: 5.2 mmol/L — ABNORMAL HIGH (ref 3.5–5.1)
Potassium: 5 mmol/L (ref 3.5–5.1)
Potassium: 5.3 mmol/L — ABNORMAL HIGH (ref 3.5–5.1)
Potassium: 4.2 mmol/L (ref 3.5–5.1)
Potassium: 4.3 mmol/L (ref 3.5–5.1)
Potassium: 4.1 mmol/L (ref 3.5–5.1)
Sodium: 136 mmol/L (ref 135–145)
Sodium: 133 mmol/L — ABNORMAL LOW (ref 135–145)
Sodium: 135 mmol/L (ref 135–145)
Sodium: 135 mmol/L (ref 135–145)
Sodium: 136 mmol/L (ref 135–145)
Sodium: 137 mmol/L (ref 135–145)
Sodium: 135 mmol/L (ref 135–145)
Sodium: 130 mmol/L — ABNORMAL LOW (ref 135–145)
TCO2: 25 mmol/L (ref 22–32)
TCO2: 26 mmol/L (ref 22–32)
TCO2: 26 mmol/L (ref 22–32)
TCO2: 26 mmol/L (ref 22–32)
TCO2: 27 mmol/L (ref 22–32)
TCO2: 25 mmol/L (ref 22–32)
TCO2: 24 mmol/L (ref 22–32)
TCO2: 23 mmol/L (ref 22–32)
pCO2 arterial: 41.1 mmHg (ref 32–48)
pCO2 arterial: 38.9 mmHg (ref 32–48)
pCO2 arterial: 40.3 mmHg (ref 32–48)
pCO2 arterial: 40.7 mmHg (ref 32–48)
pCO2 arterial: 44.9 mmHg (ref 32–48)
pCO2 arterial: 43.4 mmHg (ref 32–48)
pCO2 arterial: 44.2 mmHg (ref 32–48)
pCO2 arterial: 44.2 mmHg (ref 32–48)
pH, Arterial: 7.363 (ref 7.35–7.45)
pH, Arterial: 7.41 (ref 7.35–7.45)
pH, Arterial: 7.4 (ref 7.35–7.45)
pH, Arterial: 7.363 (ref 7.35–7.45)
pH, Arterial: 7.393 (ref 7.35–7.45)
pH, Arterial: 7.336 — ABNORMAL LOW (ref 7.35–7.45)
pH, Arterial: 7.315 — ABNORMAL LOW (ref 7.35–7.45)
pH, Arterial: 7.304 — ABNORMAL LOW (ref 7.35–7.45)
pO2, Arterial: 420 mmHg — ABNORMAL HIGH (ref 83–108)
pO2, Arterial: 341 mmHg — ABNORMAL HIGH (ref 83–108)
pO2, Arterial: 310 mmHg — ABNORMAL HIGH (ref 83–108)
pO2, Arterial: 350 mmHg — ABNORMAL HIGH (ref 83–108)
pO2, Arterial: 353 mmHg — ABNORMAL HIGH (ref 83–108)
pO2, Arterial: 92 mmHg (ref 83–108)
pO2, Arterial: 128 mmHg — ABNORMAL HIGH (ref 83–108)
pO2, Arterial: 114 mmHg — ABNORMAL HIGH (ref 83–108)

## 2022-12-22 LAB — POCT I-STAT, CHEM 8
BUN: 25 mg/dL — ABNORMAL HIGH (ref 8–23)
BUN: 24 mg/dL — ABNORMAL HIGH (ref 8–23)
BUN: 22 mg/dL (ref 8–23)
BUN: 22 mg/dL (ref 8–23)
BUN: 22 mg/dL (ref 8–23)
Calcium, Ion: 1.29 mmol/L (ref 1.15–1.40)
Calcium, Ion: 1.27 mmol/L (ref 1.15–1.40)
Calcium, Ion: 1.06 mmol/L — ABNORMAL LOW (ref 1.15–1.40)
Calcium, Ion: 1.08 mmol/L — ABNORMAL LOW (ref 1.15–1.40)
Calcium, Ion: 1.1 mmol/L — ABNORMAL LOW (ref 1.15–1.40)
Chloride: 100 mmol/L (ref 98–111)
Chloride: 99 mmol/L (ref 98–111)
Chloride: 97 mmol/L — ABNORMAL LOW (ref 98–111)
Chloride: 97 mmol/L — ABNORMAL LOW (ref 98–111)
Chloride: 99 mmol/L (ref 98–111)
Creatinine, Ser: 0.8 mg/dL (ref 0.61–1.24)
Creatinine, Ser: 1 mg/dL (ref 0.61–1.24)
Creatinine, Ser: 0.8 mg/dL (ref 0.61–1.24)
Creatinine, Ser: 0.8 mg/dL (ref 0.61–1.24)
Creatinine, Ser: 0.8 mg/dL (ref 0.61–1.24)
Glucose, Bld: 131 mg/dL — ABNORMAL HIGH (ref 70–99)
Glucose, Bld: 138 mg/dL — ABNORMAL HIGH (ref 70–99)
Glucose, Bld: 100 mg/dL — ABNORMAL HIGH (ref 70–99)
Glucose, Bld: 110 mg/dL — ABNORMAL HIGH (ref 70–99)
Glucose, Bld: 104 mg/dL — ABNORMAL HIGH (ref 70–99)
HCT: 37 % — ABNORMAL LOW (ref 39.0–52.0)
HCT: 33 % — ABNORMAL LOW (ref 39.0–52.0)
HCT: 25 % — ABNORMAL LOW (ref 39.0–52.0)
HCT: 25 % — ABNORMAL LOW (ref 39.0–52.0)
HCT: 27 % — ABNORMAL LOW (ref 39.0–52.0)
Hemoglobin: 12.6 g/dL — ABNORMAL LOW (ref 13.0–17.0)
Hemoglobin: 11.2 g/dL — ABNORMAL LOW (ref 13.0–17.0)
Hemoglobin: 8.5 g/dL — ABNORMAL LOW (ref 13.0–17.0)
Hemoglobin: 8.5 g/dL — ABNORMAL LOW (ref 13.0–17.0)
Hemoglobin: 9.2 g/dL — ABNORMAL LOW (ref 13.0–17.0)
Potassium: 4.2 mmol/L (ref 3.5–5.1)
Potassium: 4.1 mmol/L (ref 3.5–5.1)
Potassium: 5 mmol/L (ref 3.5–5.1)
Potassium: 5.4 mmol/L — ABNORMAL HIGH (ref 3.5–5.1)
Potassium: 5 mmol/L (ref 3.5–5.1)
Sodium: 136 mmol/L (ref 135–145)
Sodium: 133 mmol/L — ABNORMAL LOW (ref 135–145)
Sodium: 134 mmol/L — ABNORMAL LOW (ref 135–145)
Sodium: 135 mmol/L (ref 135–145)
Sodium: 136 mmol/L (ref 135–145)
TCO2: 26 mmol/L (ref 22–32)
TCO2: 26 mmol/L (ref 22–32)
TCO2: 26 mmol/L (ref 22–32)
TCO2: 27 mmol/L (ref 22–32)
TCO2: 27 mmol/L (ref 22–32)

## 2022-12-22 LAB — GLUCOSE, CAPILLARY
Glucose-Capillary: 130 mg/dL — ABNORMAL HIGH (ref 70–99)
Glucose-Capillary: 107 mg/dL — ABNORMAL HIGH (ref 70–99)
Glucose-Capillary: 104 mg/dL — ABNORMAL HIGH (ref 70–99)
Glucose-Capillary: 108 mg/dL — ABNORMAL HIGH (ref 70–99)
Glucose-Capillary: 104 mg/dL — ABNORMAL HIGH (ref 70–99)
Glucose-Capillary: 78 mg/dL (ref 70–99)
Glucose-Capillary: 110 mg/dL — ABNORMAL HIGH (ref 70–99)

## 2022-12-22 LAB — CBC
HCT: 27.2 % — ABNORMAL LOW (ref 39.0–52.0)
HCT: 29.9 % — ABNORMAL LOW (ref 39.0–52.0)
Hemoglobin: 8.8 g/dL — ABNORMAL LOW (ref 13.0–17.0)
Hemoglobin: 9.2 g/dL — ABNORMAL LOW (ref 13.0–17.0)
MCH: 31.4 pg (ref 26.0–34.0)
MCH: 30.2 pg (ref 26.0–34.0)
MCHC: 32.4 g/dL (ref 30.0–36.0)
MCHC: 30.8 g/dL (ref 30.0–36.0)
MCV: 97.1 fL (ref 80.0–100.0)
MCV: 98 fL (ref 80.0–100.0)
Platelets: 184 K/uL (ref 150–400)
Platelets: 221 K/uL (ref 150–400)
RBC: 2.8 MIL/uL — ABNORMAL LOW (ref 4.22–5.81)
RBC: 3.05 MIL/uL — ABNORMAL LOW (ref 4.22–5.81)
RDW: 14.3 % (ref 11.5–15.5)
RDW: 14.6 % (ref 11.5–15.5)
WBC: 11.8 K/uL — ABNORMAL HIGH (ref 4.0–10.5)
WBC: 14.2 K/uL — ABNORMAL HIGH (ref 4.0–10.5)
nRBC: 0 % (ref 0.0–0.2)
nRBC: 0 % (ref 0.0–0.2)

## 2022-12-22 LAB — BASIC METABOLIC PANEL WITH GFR
Anion gap: 11 (ref 5–15)
BUN: 17 mg/dL (ref 8–23)
CO2: 21 mmol/L — ABNORMAL LOW (ref 22–32)
Calcium: 8.6 mg/dL — ABNORMAL LOW (ref 8.9–10.3)
Chloride: 101 mmol/L (ref 98–111)
Creatinine, Ser: 0.95 mg/dL (ref 0.61–1.24)
GFR, Estimated: >60 mL/min (ref >60)
Glucose, Bld: 115 mg/dL — ABNORMAL HIGH (ref 70–99)
Potassium: 4.3 mmol/L (ref 3.5–5.1)
Sodium: 133 mmol/L — ABNORMAL LOW (ref 135–145)

## 2022-12-22 LAB — POCT I-STAT EG7
Acid-base deficit: 6 mmol/L — ABNORMAL HIGH (ref 0.0–2.0)
Bicarbonate: 18.9 mmol/L — ABNORMAL LOW (ref 20.0–28.0)
Calcium, Ion: 0.59 mmol/L — CL (ref 1.15–1.40)
HCT: 18 % — ABNORMAL LOW (ref 39.0–52.0)
Hemoglobin: 6.1 g/dL — CL (ref 13.0–17.0)
O2 Saturation: 79 %
Potassium: 4.2 mmol/L (ref 3.5–5.1)
Sodium: 127 mmol/L — ABNORMAL LOW (ref 135–145)
TCO2: 20 mmol/L — ABNORMAL LOW (ref 22–32)
pCO2, Ven: 33.3 mmHg — ABNORMAL LOW (ref 44–60)
pH, Ven: 7.362 (ref 7.25–7.43)
pO2, Ven: 44 mmHg (ref 32–45)

## 2022-12-22 LAB — APTT: aPTT: 42 s — ABNORMAL HIGH (ref 24–36)

## 2022-12-22 LAB — HEMOGLOBIN AND HEMATOCRIT, BLOOD
HCT: 23.9 % — ABNORMAL LOW (ref 39.0–52.0)
Hemoglobin: 7.4 g/dL — ABNORMAL LOW (ref 13.0–17.0)

## 2022-12-22 LAB — PROTIME-INR
INR: 1.3 — ABNORMAL HIGH (ref 0.8–1.2)
Prothrombin Time: 16 s — ABNORMAL HIGH (ref 11.4–15.2)

## 2022-12-22 LAB — HEPARIN LEVEL (UNFRACTIONATED): Heparin Unfractionated: 0.59 [IU]/mL (ref 0.30–0.70)

## 2022-12-22 LAB — PLATELET COUNT: Platelets: 133 10*3/uL — ABNORMAL LOW (ref 150–400)

## 2022-12-22 SURGERY — CORONARY ARTERY BYPASS GRAFTING (CABG)
Anesthesia: General | Site: Chest

## 2022-12-22 MED ORDER — NITROGLYCERIN IN D5W 200-5 MCG/ML-% IV SOLN
0.0000 ug/min | INTRAVENOUS | Status: DC
  Administered 2022-12-22: 10 ug/min via INTRAVENOUS

## 2022-12-22 MED ORDER — PROTAMINE SULFATE 10 MG/ML IV SOLN
INTRAVENOUS | Status: DC | PRN
  Administered 2022-12-22: 210 mg via INTRAVENOUS
  Administered 2022-12-22: 20 mg via INTRAVENOUS

## 2022-12-22 MED ORDER — ATORVASTATIN CALCIUM 80 MG PO TABS
80.0000 mg | ORAL_TABLET | Freq: Every day | ORAL | Status: DC
  Administered 2022-12-23 – 2022-12-28 (×6): 80 mg via ORAL
  Filled 2022-12-22 (×6): qty 1

## 2022-12-22 MED ORDER — VANCOMYCIN HCL IN DEXTROSE 1-5 GM/200ML-% IV SOLN
1000.0000 mg | Freq: Once | INTRAVENOUS | Status: AC
  Administered 2022-12-22: 1000 mg via INTRAVENOUS
  Filled 2022-12-22: qty 200

## 2022-12-22 MED ORDER — METOCLOPRAMIDE HCL 5 MG/ML IJ SOLN
10.0000 mg | Freq: Four times a day (QID) | INTRAMUSCULAR | Status: AC
  Administered 2022-12-22 – 2022-12-23 (×6): 10 mg via INTRAVENOUS
  Filled 2022-12-22 (×6): qty 2

## 2022-12-22 MED ORDER — ACETAMINOPHEN 500 MG PO TABS
1000.0000 mg | ORAL_TABLET | Freq: Four times a day (QID) | ORAL | Status: AC
  Administered 2022-12-22 – 2022-12-27 (×15): 1000 mg via ORAL
  Filled 2022-12-22 (×17): qty 2

## 2022-12-22 MED ORDER — CHLORHEXIDINE GLUCONATE 0.12 % MT SOLN
15.0000 mL | OROMUCOSAL | Status: AC
  Administered 2022-12-22: 15 mL via OROMUCOSAL

## 2022-12-22 MED ORDER — CEFAZOLIN SODIUM-DEXTROSE 2-4 GM/100ML-% IV SOLN
2.0000 g | Freq: Three times a day (TID) | INTRAVENOUS | Status: AC
  Administered 2022-12-22 – 2022-12-24 (×6): 2 g via INTRAVENOUS
  Filled 2022-12-22 (×6): qty 100

## 2022-12-22 MED ORDER — PHENYLEPHRINE HCL-NACL 20-0.9 MG/250ML-% IV SOLN
INTRAVENOUS | Status: DC | PRN
  Administered 2022-12-22: 50 ug/min via INTRAVENOUS

## 2022-12-22 MED ORDER — PANTOPRAZOLE SODIUM 40 MG PO TBEC
40.0000 mg | DELAYED_RELEASE_TABLET | Freq: Every day | ORAL | Status: DC
  Administered 2022-12-24 – 2022-12-28 (×5): 40 mg via ORAL
  Filled 2022-12-22 (×5): qty 1

## 2022-12-22 MED ORDER — ASPIRIN 81 MG PO CHEW
324.0000 mg | CHEWABLE_TABLET | Freq: Once | ORAL | Status: AC
  Administered 2022-12-22: 324 mg via ORAL
  Filled 2022-12-22: qty 4

## 2022-12-22 MED ORDER — POTASSIUM CHLORIDE 10 MEQ/50ML IV SOLN: 10.0000 meq | INTRAVENOUS | Status: AC

## 2022-12-22 MED ORDER — MAGNESIUM SULFATE 4 GM/100ML IV SOLN
4.0000 g | Freq: Once | INTRAVENOUS | Status: AC
  Administered 2022-12-22: 4 g via INTRAVENOUS
  Filled 2022-12-22: qty 100

## 2022-12-22 MED ORDER — SODIUM CHLORIDE 0.9 % IV SOLN: INTRAVENOUS | Status: DC

## 2022-12-22 MED ORDER — PANTOPRAZOLE SODIUM 40 MG IV SOLR
40.0000 mg | Freq: Every day | INTRAVENOUS | Status: AC
  Administered 2022-12-22 – 2022-12-23 (×2): 40 mg via INTRAVENOUS
  Filled 2022-12-22 (×2): qty 10

## 2022-12-22 MED ORDER — PROPOFOL 10 MG/ML IV BOLUS
INTRAVENOUS | Status: AC
  Filled 2022-12-22: qty 20

## 2022-12-22 MED ORDER — ACETAMINOPHEN 160 MG/5ML PO SOLN
650.0000 mg | Freq: Once | ORAL | Status: AC
  Administered 2022-12-22: 650 mg

## 2022-12-22 MED ORDER — ETOMIDATE 2 MG/ML IV SOLN
INTRAVENOUS | Status: AC
  Filled 2022-12-22: qty 10

## 2022-12-22 MED ORDER — FENTANYL CITRATE (PF) 250 MCG/5ML IJ SOLN
INTRAMUSCULAR | Status: AC
  Filled 2022-12-22: qty 5

## 2022-12-22 MED ORDER — PHENYLEPHRINE HCL-NACL 20-0.9 MG/250ML-% IV SOLN: 0.0000 ug/min | INTRAVENOUS | Status: DC

## 2022-12-22 MED ORDER — EPHEDRINE 5 MG/ML INJ
INTRAVENOUS | Status: AC
  Filled 2022-12-22: qty 5

## 2022-12-22 MED ORDER — LACTATED RINGERS IV SOLN: INTRAVENOUS | Status: DC

## 2022-12-22 MED ORDER — FAMOTIDINE 20 MG PO TABS
20.0000 mg | ORAL_TABLET | Freq: Once | ORAL | Status: AC
  Administered 2022-12-22: 20 mg via ORAL
  Filled 2022-12-22: qty 1

## 2022-12-22 MED ORDER — HEPARIN SODIUM (PORCINE) 1000 UNIT/ML IJ SOLN
INTRAMUSCULAR | Status: AC
  Filled 2022-12-22: qty 1

## 2022-12-22 MED ORDER — PLASMA-LYTE A IV SOLN
INTRAVENOUS | Status: DC | PRN
  Administered 2022-12-22: 500 mL

## 2022-12-22 MED ORDER — CHLORHEXIDINE GLUCONATE CLOTH 2 % EX PADS
6.0000 | MEDICATED_PAD | Freq: Every day | CUTANEOUS | Status: DC
  Administered 2022-12-22 – 2022-12-28 (×7): 6 via TOPICAL

## 2022-12-22 MED ORDER — CALCIUM CHLORIDE 10 % IV SOLN
INTRAVENOUS | Status: AC
  Filled 2022-12-22: qty 10

## 2022-12-22 MED ORDER — DEXTROSE 50 % IV SOLN: 0.0000 mL | INTRAVENOUS | Status: DC | PRN

## 2022-12-22 MED ORDER — BISACODYL 10 MG RE SUPP
10.0000 mg | Freq: Every day | RECTAL | Status: DC
  Filled 2022-12-22 (×2): qty 1

## 2022-12-22 MED ORDER — SODIUM CHLORIDE 0.45 % IV SOLN: INTRAVENOUS | Status: DC | PRN

## 2022-12-22 MED ORDER — BISACODYL 5 MG PO TBEC
10.0000 mg | DELAYED_RELEASE_TABLET | Freq: Every day | ORAL | Status: DC
  Administered 2022-12-23 – 2022-12-27 (×4): 10 mg via ORAL
  Filled 2022-12-22 (×6): qty 2

## 2022-12-22 MED ORDER — SODIUM CHLORIDE 0.9% FLUSH: 3.0000 mL | INTRAVENOUS | Status: DC | PRN

## 2022-12-22 MED ORDER — CALCIUM CHLORIDE 10 % IV SOLN
INTRAVENOUS | Status: DC | PRN
  Administered 2022-12-22 (×2): 200 mg via INTRAVENOUS

## 2022-12-22 MED ORDER — SODIUM CHLORIDE 0.9 % IV SOLN: 250.0000 mL | INTRAVENOUS | Status: DC

## 2022-12-22 MED ORDER — ONDANSETRON HCL 4 MG/2ML IJ SOLN
4.0000 mg | Freq: Four times a day (QID) | INTRAMUSCULAR | Status: DC | PRN
  Administered 2022-12-23 – 2022-12-24 (×2): 4 mg via INTRAVENOUS
  Filled 2022-12-22 (×2): qty 2

## 2022-12-22 MED ORDER — SODIUM CHLORIDE 0.9% FLUSH
10.0000 mL | Freq: Two times a day (BID) | INTRAVENOUS | Status: DC
  Administered 2022-12-22 – 2022-12-28 (×12): 10 mL

## 2022-12-22 MED ORDER — ROCURONIUM BROMIDE 10 MG/ML (PF) SYRINGE
PREFILLED_SYRINGE | INTRAVENOUS | Status: AC
  Filled 2022-12-22: qty 10

## 2022-12-22 MED ORDER — METOPROLOL TARTRATE 25 MG/10 ML ORAL SUSPENSION
12.5000 mg | Freq: Two times a day (BID) | ORAL | Status: DC
  Filled 2022-12-22 (×6): qty 5

## 2022-12-22 MED ORDER — INSULIN REGULAR(HUMAN) IN NACL 100-0.9 UT/100ML-% IV SOLN: INTRAVENOUS | Status: DC

## 2022-12-22 MED ORDER — SODIUM CHLORIDE 0.9% FLUSH: 10.0000 mL | INTRAVENOUS | Status: DC | PRN

## 2022-12-22 MED ORDER — METOPROLOL TARTRATE 5 MG/5ML IV SOLN
2.5000 mg | INTRAVENOUS | Status: DC | PRN
  Administered 2022-12-27: 5 mg via INTRAVENOUS
  Filled 2022-12-22: qty 5

## 2022-12-22 MED ORDER — ACETAMINOPHEN 160 MG/5ML PO SOLN: 1000.0000 mg | Freq: Four times a day (QID) | ORAL | Status: AC

## 2022-12-22 MED ORDER — EPHEDRINE SULFATE-NACL 50-0.9 MG/10ML-% IV SOSY
PREFILLED_SYRINGE | INTRAVENOUS | Status: DC | PRN
  Administered 2022-12-22 (×3): 5 mg via INTRAVENOUS

## 2022-12-22 MED ORDER — PROTAMINE SULFATE 10 MG/ML IV SOLN
INTRAVENOUS | Status: AC
  Filled 2022-12-22: qty 25

## 2022-12-22 MED ORDER — ALBUMIN HUMAN 5 % IV SOLN: INTRAVENOUS | Status: DC | PRN

## 2022-12-22 MED ORDER — PHENYLEPHRINE 80 MCG/ML (10ML) SYRINGE FOR IV PUSH (FOR BLOOD PRESSURE SUPPORT)
PREFILLED_SYRINGE | INTRAVENOUS | Status: DC | PRN
  Administered 2022-12-22: 160 ug via INTRAVENOUS

## 2022-12-22 MED ORDER — ORAL CARE MOUTH RINSE: 15.0000 mL | OROMUCOSAL | Status: DC | PRN

## 2022-12-22 MED ORDER — MIDAZOLAM HCL 2 MG/2ML IJ SOLN: 2.0000 mg | INTRAMUSCULAR | Status: DC | PRN

## 2022-12-22 MED ORDER — ROCURONIUM BROMIDE 10 MG/ML (PF) SYRINGE
PREFILLED_SYRINGE | INTRAVENOUS | Status: DC | PRN
  Administered 2022-12-22: 20 mg via INTRAVENOUS
  Administered 2022-12-22: 100 mg via INTRAVENOUS
  Administered 2022-12-22 (×2): 40 mg via INTRAVENOUS

## 2022-12-22 MED ORDER — SODIUM CHLORIDE (PF) 0.9 % IJ SOLN
INTRAMUSCULAR | Status: AC
  Filled 2022-12-22: qty 10

## 2022-12-22 MED ORDER — DOCUSATE SODIUM 100 MG PO CAPS
200.0000 mg | ORAL_CAPSULE | Freq: Every day | ORAL | Status: DC
  Administered 2022-12-23 – 2022-12-28 (×6): 200 mg via ORAL
  Filled 2022-12-22 (×6): qty 2

## 2022-12-22 MED ORDER — TRAMADOL HCL 50 MG PO TABS
50.0000 mg | ORAL_TABLET | ORAL | Status: DC | PRN
  Administered 2022-12-23: 50 mg via ORAL
  Administered 2022-12-23: 100 mg via ORAL
  Filled 2022-12-22: qty 1
  Filled 2022-12-22: qty 2

## 2022-12-22 MED ORDER — DEXMEDETOMIDINE HCL IN NACL 400 MCG/100ML IV SOLN: 0.0000 ug/kg/h | INTRAVENOUS | Status: DC

## 2022-12-22 MED ORDER — PHENYLEPHRINE 80 MCG/ML (10ML) SYRINGE FOR IV PUSH (FOR BLOOD PRESSURE SUPPORT)
PREFILLED_SYRINGE | INTRAVENOUS | Status: AC
  Filled 2022-12-22: qty 10

## 2022-12-22 MED ORDER — VANCOMYCIN HCL 1000 MG IV SOLR
INTRAVENOUS | Status: DC | PRN
  Administered 2022-12-22: 1000 mL

## 2022-12-22 MED ORDER — ASPIRIN 325 MG PO TBEC
325.0000 mg | DELAYED_RELEASE_TABLET | Freq: Every day | ORAL | Status: DC
  Administered 2022-12-23 – 2022-12-26 (×4): 325 mg via ORAL
  Filled 2022-12-22 (×4): qty 1

## 2022-12-22 MED ORDER — MILRINONE LACTATE IN DEXTROSE 20-5 MG/100ML-% IV SOLN: 0.3000 ug/kg/min | INTRAVENOUS | Status: DC

## 2022-12-22 MED ORDER — NOREPINEPHRINE 4 MG/250ML-% IV SOLN
0.0000 ug/min | INTRAVENOUS | Status: DC
  Administered 2022-12-22: 5 ug/min via INTRAVENOUS

## 2022-12-22 MED ORDER — SODIUM CHLORIDE 0.9% FLUSH
3.0000 mL | Freq: Two times a day (BID) | INTRAVENOUS | Status: DC
  Administered 2022-12-23 – 2022-12-26 (×6): 3 mL via INTRAVENOUS

## 2022-12-22 MED ORDER — MIDAZOLAM HCL (PF) 10 MG/2ML IJ SOLN
INTRAMUSCULAR | Status: AC
  Filled 2022-12-22: qty 2

## 2022-12-22 MED ORDER — FENTANYL CITRATE (PF) 250 MCG/5ML IJ SOLN
INTRAMUSCULAR | Status: DC | PRN
  Administered 2022-12-22: 50 ug via INTRAVENOUS
  Administered 2022-12-22: 100 ug via INTRAVENOUS
  Administered 2022-12-22: 50 ug via INTRAVENOUS
  Administered 2022-12-22: 150 ug via INTRAVENOUS
  Administered 2022-12-22: 250 ug via INTRAVENOUS
  Administered 2022-12-22: 100 ug via INTRAVENOUS
  Administered 2022-12-22: 50 ug via INTRAVENOUS
  Administered 2022-12-22: 100 ug via INTRAVENOUS
  Administered 2022-12-22: 150 ug via INTRAVENOUS

## 2022-12-22 MED ORDER — PAPAVERINE HCL 30 MG/ML IJ SOLN
INTRAMUSCULAR | Status: DC | PRN
  Administered 2022-12-22: 60 mg via INTRAVENOUS

## 2022-12-22 MED ORDER — ASPIRIN 81 MG PO CHEW: 324.0000 mg | CHEWABLE_TABLET | Freq: Every day | ORAL | Status: DC

## 2022-12-22 MED ORDER — MIDAZOLAM HCL (PF) 5 MG/ML IJ SOLN
INTRAMUSCULAR | Status: DC | PRN
  Administered 2022-12-22: 2 mg via INTRAVENOUS
  Administered 2022-12-22: 3 mg via INTRAVENOUS
  Administered 2022-12-22 (×2): 1 mg via INTRAVENOUS

## 2022-12-22 MED ORDER — ETOMIDATE 2 MG/ML IV SOLN
INTRAVENOUS | Status: DC | PRN
  Administered 2022-12-22: 10 mg via INTRAVENOUS

## 2022-12-22 MED ORDER — OXYCODONE HCL 5 MG PO TABS
5.0000 mg | ORAL_TABLET | ORAL | Status: DC | PRN
  Administered 2022-12-22 – 2022-12-23 (×3): 10 mg via ORAL
  Administered 2022-12-24: 5 mg via ORAL
  Filled 2022-12-22: qty 2
  Filled 2022-12-22: qty 1
  Filled 2022-12-22 (×2): qty 2

## 2022-12-22 MED ORDER — PAPAVERINE HCL 30 MG/ML IJ SOLN
INTRAMUSCULAR | Status: AC
  Filled 2022-12-22: qty 2

## 2022-12-22 MED ORDER — MORPHINE SULFATE (PF) 2 MG/ML IV SOLN
1.0000 mg | INTRAVENOUS | Status: DC | PRN
  Administered 2022-12-22: 1 mg via INTRAVENOUS
  Administered 2022-12-22: 2 mg via INTRAVENOUS
  Filled 2022-12-22 (×2): qty 1

## 2022-12-22 MED ORDER — ALBUMIN HUMAN 5 % IV SOLN
250.0000 mL | INTRAVENOUS | Status: DC | PRN
  Filled 2022-12-22 (×2): qty 250

## 2022-12-22 MED ORDER — 0.9 % SODIUM CHLORIDE (POUR BTL) OPTIME
TOPICAL | Status: DC | PRN
  Administered 2022-12-22: 6000 mL

## 2022-12-22 MED ORDER — HEPARIN SODIUM (PORCINE) 1000 UNIT/ML IJ SOLN
INTRAMUSCULAR | Status: DC | PRN
  Administered 2022-12-22: 23000 [IU] via INTRAVENOUS

## 2022-12-22 MED ORDER — LACTATED RINGERS IV SOLN: INTRAVENOUS | Status: DC | PRN

## 2022-12-22 MED ORDER — METOPROLOL TARTRATE 12.5 MG HALF TABLET
12.5000 mg | ORAL_TABLET | Freq: Two times a day (BID) | ORAL | Status: DC
  Administered 2022-12-23 – 2022-12-26 (×8): 12.5 mg via ORAL
  Filled 2022-12-22 (×9): qty 1

## 2022-12-22 SURGICAL SUPPLY — 87 items
ADAPTER CARDIO PERF ANTE/RETRO (ADAPTER) ×2 IMPLANT
ADAPTER DLP PERFUSION .25INX2I (MISCELLANEOUS) IMPLANT
ADH SKN CLS APL DERMABOND .7 (GAUZE/BANDAGES/DRESSINGS) ×2
ADPR CRDPLG .25X.64 STRL (MISCELLANEOUS) ×2
ADPR PRFSN 84XANTGRD RTRGD (ADAPTER) ×2
BAG DECANTER FOR FLEXI CONT (MISCELLANEOUS) ×2 IMPLANT
BLADE CLIPPER SURG (BLADE) ×2 IMPLANT
BLADE MICRO SHARP 3 15 DEG (BLADE) ×2 IMPLANT
BLADE STERNUM SYSTEM 6 (BLADE) ×2 IMPLANT
BNDG CMPR 5X4 KNIT ELC UNQ LF (GAUZE/BANDAGES/DRESSINGS) ×2
BNDG CMPR 6 X 5 YARDS HK CLSR (GAUZE/BANDAGES/DRESSINGS) ×2
BNDG ELASTIC 4INX 5YD STR LF (GAUZE/BANDAGES/DRESSINGS) IMPLANT
BNDG ELASTIC 4X5.8 VLCR STR LF (GAUZE/BANDAGES/DRESSINGS) ×2 IMPLANT
BNDG ELASTIC 6INX 5YD STR LF (GAUZE/BANDAGES/DRESSINGS) IMPLANT
BNDG ELASTIC 6X5.8 VLCR STR LF (GAUZE/BANDAGES/DRESSINGS) ×2 IMPLANT
BNDG GAUZE DERMACEA FLUFF 4 (GAUZE/BANDAGES/DRESSINGS) ×2 IMPLANT
BNDG GZE DERMACEA 4 6PLY (GAUZE/BANDAGES/DRESSINGS) ×2
BOOT SUTURE VASCULAR YLW (MISCELLANEOUS) ×2
CANISTER SUCT 3000ML PPV (MISCELLANEOUS) ×2 IMPLANT
CANNULA NON VENT 20FR 12 (CANNULA) ×2 IMPLANT
CATH RETROPLEGIA CORONARY 14FR (CATHETERS) ×2 IMPLANT
CATH ROBINSON RED A/P 18FR (CATHETERS) ×6 IMPLANT
CATH THOR STR 32F SOFT 20 RADI (CATHETERS) ×4 IMPLANT
CATH THORACIC 28FR RT ANG (CATHETERS) ×2 IMPLANT
CLAMP SUTURE YELLOW 5 PAIRS (MISCELLANEOUS) ×2 IMPLANT
CLIP TI LARGE 6 (CLIP) ×2 IMPLANT
CLIP TI MEDIUM 24 (CLIP) IMPLANT
CLIP TI WIDE RED SMALL 24 (CLIP) IMPLANT
CNTNR URN SCR LID CUP LEK RST (MISCELLANEOUS) ×4 IMPLANT
CONT SPEC 4OZ STRL OR WHT (MISCELLANEOUS) ×4
DERMABOND ADVANCED .7 DNX12 (GAUZE/BANDAGES/DRESSINGS) IMPLANT
DRAPE CV SPLIT W-CLR ANES SCRN (DRAPES) ×2 IMPLANT
DRAPE INCISE IOBAN 66X45 STRL (DRAPES) ×2 IMPLANT
DRAPE PERI GROIN 82X75IN TIB (DRAPES) ×2 IMPLANT
DRSG AQUACEL AG ADV 3.5X10 (GAUZE/BANDAGES/DRESSINGS) ×2 IMPLANT
ELECT BLADE 4.0 EZ CLEAN MEGAD (MISCELLANEOUS) ×2
ELECT REM PT RETURN 9FT ADLT (ELECTROSURGICAL) ×4
ELECTRODE BLDE 4.0 EZ CLN MEGD (MISCELLANEOUS) ×2 IMPLANT
ELECTRODE REM PT RTRN 9FT ADLT (ELECTROSURGICAL) ×4 IMPLANT
FELT TEFLON 1X6 (MISCELLANEOUS) ×2 IMPLANT
GAUZE SPONGE 4X4 12PLY STRL (GAUZE/BANDAGES/DRESSINGS) ×4 IMPLANT
GLOVE BIO SURGEON STRL SZ7 (GLOVE) IMPLANT
GLOVE SS BIOGEL STRL SZ 6.5 (GLOVE) IMPLANT
GLOVE SURG SS PI 7.0 STRL IVOR (GLOVE) IMPLANT
GLOVE SURG SS PI 7.5 STRL IVOR (GLOVE) IMPLANT
GOWN STRL REUS W/ TWL LRG LVL3 (GOWN DISPOSABLE) ×12 IMPLANT
GOWN STRL REUS W/TWL LRG LVL3 (GOWN DISPOSABLE) ×14
INSERT FOGARTY 61MM (MISCELLANEOUS) IMPLANT
KIT BASIN OR (CUSTOM PROCEDURE TRAY) ×2 IMPLANT
KIT TURNOVER KIT B (KITS) ×2 IMPLANT
KIT VASOVIEW HEMOPRO 2 VH 4000 (KITS) ×2 IMPLANT
MARKER DISTAL GRAFT W/ HOLDER (MISCELLANEOUS) ×4 IMPLANT
NS IRRIG 1000ML POUR BTL (IV SOLUTION) ×10 IMPLANT
PACK E OPEN HEART (SUTURE) ×2 IMPLANT
PACK OPEN HEART (CUSTOM PROCEDURE TRAY) ×2 IMPLANT
PAD ARMBOARD 7.5X6 YLW CONV (MISCELLANEOUS) ×4 IMPLANT
PAD ELECT DEFIB RADIOL ZOLL (MISCELLANEOUS) ×2 IMPLANT
PENCIL BUTTON HOLSTER BLD 10FT (ELECTRODE) ×2 IMPLANT
POSITIONER HEAD DONUT 9IN (MISCELLANEOUS) ×2 IMPLANT
PUNCH AORTIC ROTATE 4.0MM (MISCELLANEOUS) ×2 IMPLANT
PUNCH AORTIC ROTATE 4.5MM 8IN (MISCELLANEOUS) IMPLANT
SET MPS 3-ND DEL (MISCELLANEOUS) IMPLANT
SUPPORT HEART JANKE-BARRON (MISCELLANEOUS) ×2 IMPLANT
SUT MNCRL AB 3-0 PS2 18 (SUTURE) IMPLANT
SUT MNCRL AB 4-0 PS2 18 (SUTURE) ×4 IMPLANT
SUT PROLENE 4 0 RB 1 (SUTURE) ×2
SUT PROLENE 4 0 SH DA (SUTURE) ×2 IMPLANT
SUT PROLENE 4-0 RB1 .5 CRCL 36 (SUTURE) ×2 IMPLANT
SUT PROLENE 5 0 C 1 36 (SUTURE) IMPLANT
SUT PROLENE 6 0 C 1 30 (SUTURE) IMPLANT
SUT PROLENE 7 0 BV 1 (SUTURE) IMPLANT
SUT PROLENE 7 0 BV1 MDA (SUTURE) ×2 IMPLANT
SUT PROLENE 8 0 BV175 6 (SUTURE) IMPLANT
SUT STEEL SZ 6 DBL 3X14 BALL (SUTURE) ×4 IMPLANT
SUT VIC AB 0 CTX 36 (SUTURE) ×4
SUT VIC AB 0 CTX36XBRD ANTBCTR (SUTURE) ×4 IMPLANT
SUT VIC AB 2-0 CT1 27 (SUTURE) ×6
SUT VIC AB 2-0 CT1 TAPERPNT 27 (SUTURE) ×4 IMPLANT
SYSTEM SAHARA CHEST DRAIN ATS (WOUND CARE) ×2 IMPLANT
TAG SUTURE CLAMP YLW 5PR (MISCELLANEOUS) ×2
TOWEL GREEN STERILE (TOWEL DISPOSABLE) ×2 IMPLANT
TOWEL GREEN STERILE FF (TOWEL DISPOSABLE) ×2 IMPLANT
TRAY FOLEY SLVR 16FR TEMP STAT (SET/KITS/TRAYS/PACK) ×2 IMPLANT
TUBING LAP HI FLOW INSUFFLATIO (TUBING) ×2 IMPLANT
UNDERPAD 30X36 HEAVY ABSORB (UNDERPADS AND DIAPERS) ×2 IMPLANT
WATER STERILE IRR 1000ML POUR (IV SOLUTION) ×4 IMPLANT
YANKAUER SUCT BULB TIP NO VENT (SUCTIONS) IMPLANT

## 2022-12-22 NOTE — Progress Notes (Signed)
ANTICOAGULATION CONSULT NOTE - Follow Up  Pharmacy Consult for heparin Indication: chest pain/ACS  No Known Allergies  Patient Measurements: Height: 5\' 10"  (177.8 cm) Weight: 69.2 kg (152 lb 8.9 oz) IBW/kg (Calculated) : 73 Heparin Dosing Weight: 72.6 kg  Vital Signs: Temp: 97.9 F (36.6 C) (09/18 1535) Temp Source: Oral (09/18 0736) BP: 81/53 (09/18 1500) Pulse Rate: 80 (09/18 1535)  Labs: Recent Labs    12/20/22 0236 12/21/22 0256 12/22/22 0519 12/22/22 0844 12/22/22 1033 12/22/22 1054 12/22/22 1103 12/22/22 1122 12/22/22 1130 12/22/22 1144 12/22/22 1148 12/22/22 1251  HGB 13.2 12.8* 12.6*   < > 8.5*   < > 7.4* 8.5*   < > 9.2* 9.2* 8.8*  HCT 41.2 39.5 39.3   < > 25.0*   < > 23.9* 25.0*   < > 27.0* 27.0* 27.2*  PLT 330 323 347  --   --   --  133*  --   --   --   --  184  APTT  --   --  106*  --   --   --   --   --   --   --   --  42*  LABPROT  --   --  13.3  --   --   --   --   --   --   --   --  16.0*  INR  --   --  1.0  --   --   --   --   --   --   --   --  1.3*  HEPARINUNFRC 0.53 0.55 0.59  --   --   --   --   --   --   --   --   --   CREATININE 1.01 1.07 1.11   < > 0.80  --   --  0.80  --  0.80  --   --    < > = values in this interval not displayed.    Estimated Creatinine Clearance: 73.3 mL/min (by C-G formula based on SCr of 0.8 mg/dL).   Medical History: Past Medical History:  Diagnosis Date   Diabetes mellitus without complication (HCC)    Gout    HTN (hypertension)     Medications:  Scheduled:   [START ON 12/23/2022] acetaminophen  1,000 mg Oral Q6H   Or   [START ON 12/23/2022] acetaminophen (TYLENOL) oral liquid 160 mg/5 mL  1,000 mg Per Tube Q6H   acetaminophen (TYLENOL) oral liquid 160 mg/5 mL  650 mg Per Tube Once   aspirin  324 mg Oral Once   [START ON 12/23/2022] aspirin EC  325 mg Oral Daily   Or   [START ON 12/23/2022] aspirin  324 mg Per Tube Daily   [START ON 12/23/2022] atorvastatin  80 mg Oral Daily   [START ON 12/23/2022]  bisacodyl  10 mg Oral Daily   Or   [START ON 12/23/2022] bisacodyl  10 mg Rectal Daily   chlorhexidine  15 mL Mouth/Throat NOW   Chlorhexidine Gluconate Cloth  6 each Topical Daily   [START ON 12/23/2022] docusate sodium  200 mg Oral Daily   metoCLOPramide (REGLAN) injection  10 mg Intravenous Q6H   metoprolol tartrate  12.5 mg Oral BID   Or   metoprolol tartrate  12.5 mg Per Tube BID   nitroGLYCERIN  1 inch Topical Once   [START ON 12/24/2022] pantoprazole  40 mg Oral Daily   pantoprazole (PROTONIX) IV  40  mg Intravenous QHS   sodium chloride flush  10-40 mL Intracatheter Q12H   [START ON 12/23/2022] sodium chloride flush  3 mL Intravenous Q12H   Infusions:   sodium chloride Stopped (12/22/22 1316)   [START ON 12/23/2022] sodium chloride     sodium chloride     albumin human 999 mL/hr at 12/22/22 1526    ceFAZolin (ANCEF) IV Stopped (12/22/22 1437)   dexmedetomidine (PRECEDEX) IV infusion 0.4 mcg/kg/hr (12/22/22 1500)   insulin 1 Units/hr (12/22/22 1500)   lactated ringers     lactated ringers 75 mL/hr at 12/22/22 1500   magnesium sulfate 20 mL/hr at 12/22/22 1500   milrinone     nitroGLYCERIN Stopped (12/22/22 1340)   norepinephrine (LEVOPHED) Adult infusion 3 mcg/min (12/22/22 1500)   phenylephrine (NEO-SYNEPHRINE) Adult infusion Stopped (12/22/22 1312)   vancomycin      Assessment: 82 yo M presented from [facility] for LHC in the setting of STEMI. Patient started on Aggrastat during cath procedure, unable to open culprit lesion with PCI, plan for eventual CABG after stabilization of LV function. Not on anticoagulation prior to admission.  Heparin level 0.59 is therapeutic with heparin running at 1500 units/hr. Hgb (12.6) and PLTs (347) are stable.   Patient went to CABG today, heparin infusion is now off post-op.   Goal of Therapy:  Heparin level 0.3-0.7 units/ml Monitor platelets by anticoagulation protocol: Yes   Plan:  Pharmacy will follow peripherally  Romie Minus, PharmD PGY1 Pharmacy Resident  Please check AMION for all [facility] Pharmacy phone numbers After 10:00 PM, call Main Pharmacy [phone]

## 2022-12-22 NOTE — Hospital Course (Addendum)
   Referring Physician:  Kathleene Hazel, MD   PCP:  Lovey Newcomer, PA   Follow up appts requested 9/18  History of Present Illness:     Pt is nearly an 82 yo male with no previous cardiac history who developed CP last night that recurred this am. Pt with associated nausea and SOB. PT seen in outside hospital and STEMI diagnosed and sent to [facility] cath lab where severe CAD found and only able to balloon distal LAD. Pt now CP free. No echo yet.  Was asked to evaluate for CABG. All the risks and goals of surgery along with recovery were discussed with pt and son and they understand and wish to proceed. Targets include LAD, OM and PLAT.  Hospital Course:  Mr. Delucchi remained stable following left heart catheterization and coronary angioplasty.  Echocardiogram was obtained showing no significant valvular dysfunction.  Left ventricular ejection fraction was estimated 25 to 30%.  Advanced heart failure team was consulted for medical optimization prior to proceeding with surgery.  Mr. Joens was diuresed.  He was started on digoxin.  Cardiogenic shock resolved.  He did not have any further chest pain. Mr. Laurence Compton was taken to the operating room on 12/23/2022 where three-vessel coronary bypass grafting was accomplished.  The left internal mammary artery was grafted to the left anterior descending coronary artery.  Separate saphenous vein grafts were placed to the obtuse marginal and posterior descending coronary arteries.  Following the procedure, he separated from cardiopulmonary bypass without difficulty.  Vital sign and hemodynamics were stable. He was weaned from the ventilator and extubated on the evening of surgery. Oral amiodarone prophylaxis was initiated on post-op day 1. He was mobilized in the ICU. He was ready for transfer to [facility] on post-op day 2.   Thse chest tubes were left in place for drainage and a small air leak. Air leak resolved and chest tubes were removed on  09/22. Prophylactic Amiodarone was decreased to 200 mg bid as HR mostly in the 60's and he was already on Lopressor 12.5 mg bid. He had mild volume overload and was diuresed accordingly. He developed urinary retention so foley was reinserted and he was started on Flomax. He has been tolerating a diet and has had a bowel movement. All wounds are clean, dry, healing without signs of infection. He initially required several liters of oxygen via [place] but was later ambulating on room air with good oxygenation. Voiding trial was done on 09/22. He is felt stable for discharge today.

## 2022-12-22 NOTE — Progress Notes (Signed)
  Echocardiogram Echocardiogram Transesophageal has been performed.  Terry White 12/22/2022, 9:30 AM

## 2022-12-22 NOTE — Anesthesia Procedure Notes (Signed)
Central Venous Catheter Insertion Performed by: Collene Schlichter, MD, anesthesiologist Start/End9/18/2024 7:40 AM, 12/22/2022 7:50 AM Patient location: Pre-op. Preanesthetic checklist: patient identified, IV checked, site marked, risks and benefits discussed, surgical consent, monitors and equipment checked, pre-op evaluation, timeout performed and anesthesia consent Position: Trendelenburg Lidocaine 1% used for infiltration and patient sedated Hand hygiene performed , maximum sterile barriers used  and Seldinger technique used Catheter size: 8.5 Fr Central line was placed.Sheath introducer Procedure performed using ultrasound guided technique. Ultrasound Notes:anatomy identified, needle tip was noted to be adjacent to the nerve/plexus identified, no ultrasound evidence of intravascular and/or intraneural injection and image(s) printed for medical record Attempts: 1 Following insertion, line sutured, dressing applied and Biopatch. Post procedure assessment: free fluid flow, blood return through all ports and no air  Patient tolerated the procedure well with no immediate complications.

## 2022-12-22 NOTE — Brief Op Note (Signed)
12/16/2022 - 12/22/2022  11:17 AM  PATIENT:  Terry White  82 y.o. male  PRE-OPERATIVE DIAGNOSIS:  CORONARY ARTERY DISEASE, ACUTE STEMI  POST-OPERATIVE DIAGNOSIS:  CORONARY ARTERY DISEASE ACUTE STEMI  PROCEDURE:   CORONARY ARTERY BYPASS GRAFTING (CABG)X, USING LEFT INTERNAL MAMMARY ARTERY AND RIGHT LEG GREATER SAPHENEOUS VEIN HARVESTED ENDOSCOPICLY    LIMA->LAD  SVG->OM  SVG->PDA   Vein harvest time: Vein prep time:  TRANSESOPHAGEAL ECHOCARDIOGRAM  SURGEON: Eugenio Hoes, MD - Primary  PHYSICIAN ASSISTANT: Conall Vangorder   ASSISTANTS: Farrel Demark, Scrub Person         Ria Bush, RN, Scrub Person   ANESTHESIA:   general  EBL:   BLOOD ADMINISTERED:none  DRAINS: Left pleural and mediastinal drains  LOCAL MEDICATIONS USED:  NONE  SPECIMEN:  No Specimen  DISPOSITION OF SPECIMEN:  N/A  COUNTS:  correct  DICTATION: .Dragon Dictation  PLAN OF CARE: Admit to inpatient   PATIENT DISPOSITION:  ICU - intubated and hemodynamically stable.   Delay start of Pharmacological VTE agent (>24hrs) due to surgical blood loss or risk of bleeding: yes

## 2022-12-22 NOTE — Anesthesia Procedure Notes (Signed)
Arterial Line Insertion Start/End9/18/2024 7:40 AM, 12/22/2022 7:50 AM Performed by: CRNA  Patient location: Pre-op. Preanesthetic checklist: patient identified, IV checked, site marked, risks and benefits discussed, surgical consent, monitors and equipment checked, pre-op evaluation, timeout performed and anesthesia consent Lidocaine 1% used for infiltration Left, radial was placed Catheter size: 20 G Hand hygiene performed  and maximum sterile barriers used   Attempts: 2 Procedure performed without using ultrasound guided technique. Following insertion, dressing applied and Biopatch. Post procedure assessment: normal  Patient tolerated the procedure well with no immediate complications.

## 2022-12-22 NOTE — Transfer of Care (Signed)
Immediate Anesthesia Transfer of Care Note  Patient: Terry White  Procedure(s) Performed: CORONARY ARTERY BYPASS GRAFTING (CABG)XTHREE, USING LEFT INTERNAL MAMMARY ARTERY AND RIGHT LEG GREATER SAPHENEOUS VEIN HARVESTED ENDOSCOPICLY (Chest) TRANSESOPHAGEAL ECHOCARDIOGRAM  Patient Location: ICU  Anesthesia Type:General  Level of Consciousness: sedated and unresponsive  Airway & Oxygen Therapy: Patient remains intubated per anesthesia plan and Patient placed on Ventilator (see vital sign flow sheet for setting)  Post-op Assessment: Report given to RN and Post -op Vital signs reviewed and stable  Post vital signs: Reviewed and stable  Last Vitals:  Vitals Value Taken Time  BP    Temp 35.5 C 12/22/22 1247  Pulse 80 12/22/22 1247  Resp 16 12/22/22 1247  SpO2 96 % 12/22/22 1247  Vitals shown include unfiled device data.  Last Pain:  Vitals:   12/22/22 0736  TempSrc: Oral  PainSc: 0-No pain      Patients Stated Pain Goal: 0 (12/22/22 0400)  Complications: No notable events documented.

## 2022-12-22 NOTE — Procedures (Signed)
Extubation Procedure Note  Patient Details:   Name: Terry White DOB: Nov 17, 1940 MRN: [id number]   Airway Documentation:    Vent end date: 12/22/22 Vent end time: 1725   Evaluation  O2 sats: stable throughout Complications: No apparent complications Patient did tolerate procedure well. Bilateral Breath Sounds: Clear   Yes  Patient was extubated to a 4L [place] without any complications, dyspnea or stridor noted. Positive cuff leak prior to extubation.   Corinna Burkman, Margaretmary Dys 12/22/2022, 5:25 PM

## 2022-12-22 NOTE — Plan of Care (Signed)
Problem: Education: Goal: Knowledge of General Education information will improve Description: Including pain rating scale, medication(s)/side effects and non-pharmacologic comfort measures Outcome: Progressing   Problem: Health Behavior/Discharge Planning: Goal: Ability to manage health-related needs will improve Outcome: Progressing   Problem: Clinical Measurements: Goal: Ability to maintain clinical measurements within normal limits will improve Outcome: Progressing Goal: Will remain free from infection Outcome: Progressing Goal: Diagnostic test results will improve Outcome: Progressing Goal: Respiratory complications will improve Outcome: Progressing Goal: Cardiovascular complication will be avoided Outcome: Progressing   Problem: Activity: Goal: Risk for activity intolerance will decrease Outcome: Progressing   Problem: Nutrition: Goal: Adequate nutrition will be maintained Outcome: Progressing   Problem: Coping: Goal: Level of anxiety will decrease Outcome: Progressing   Problem: Elimination: Goal: Will not experience complications related to bowel motility Outcome: Progressing Goal: Will not experience complications related to urinary retention Outcome: Progressing   Problem: Pain Managment: Goal: General experience of comfort will improve Outcome: Progressing   Problem: Safety: Goal: Ability to remain free from injury will improve Outcome: Progressing   Problem: Skin Integrity: Goal: Risk for impaired skin integrity will decrease Outcome: Progressing   Problem: Education: Goal: Understanding of CV disease, CV risk reduction, and recovery process will improve Outcome: Progressing   Problem: Activity: Goal: Ability to return to baseline activity level will improve Outcome: Progressing   Problem: Cardiovascular: Goal: Ability to achieve and maintain adequate cardiovascular perfusion will improve Outcome: Progressing Goal: Vascular access site(s)  Level 0-1 will be maintained Outcome: Progressing   Problem: Health Behavior/Discharge Planning: Goal: Ability to safely manage health-related needs after discharge will improve Outcome: Progressing   Problem: Education: Goal: Ability to describe self-care measures that may prevent or decrease complications (Diabetes Survival Skills Education) will improve Outcome: Progressing   Problem: Coping: Goal: Ability to adjust to condition or change in health will improve Outcome: Progressing   Problem: Fluid Volume: Goal: Ability to maintain a balanced intake and output will improve Outcome: Progressing   Problem: Health Behavior/Discharge Planning: Goal: Ability to identify and utilize available resources and services will improve Outcome: Progressing Goal: Ability to manage health-related needs will improve Outcome: Progressing   Problem: Metabolic: Goal: Ability to maintain appropriate glucose levels will improve Outcome: Progressing   Problem: Nutritional: Goal: Maintenance of adequate nutrition will improve Outcome: Progressing Goal: Progress toward achieving an optimal weight will improve Outcome: Progressing   Problem: Skin Integrity: Goal: Risk for impaired skin integrity will decrease Outcome: Progressing   Problem: Tissue Perfusion: Goal: Adequacy of tissue perfusion will improve Outcome: Progressing

## 2022-12-22 NOTE — Anesthesia Procedure Notes (Signed)
Procedure Name: Intubation Date/Time: 12/22/2022 8:34 AM  Performed by: Marena Chancy, CRNAPre-anesthesia Checklist: Patient identified, Emergency Drugs available, Suction available and Patient being monitored Patient Re-evaluated:Patient Re-evaluated prior to induction Oxygen Delivery Method: Circle System Utilized Preoxygenation: Pre-oxygenation with 100% oxygen Induction Type: IV induction Ventilation: Mask ventilation without difficulty Laryngoscope Size: Miller and 2 Grade View: Grade II Tube type: Oral Tube size: 8.0 mm Number of attempts: 1 Airway Equipment and Method: Stylet and Oral airway Placement Confirmation: ETT inserted through vocal cords under direct vision, positive ETCO2 and breath sounds checked- equal and bilateral Tube secured with: Tape Dental Injury: Teeth and Oropharynx as per pre-operative assessment

## 2022-12-22 NOTE — Op Note (Signed)
[address]       [place] [address]             [phone]                                          12/22/2022 Patient:  Terry White  Pre-Op Dx: STEMI and with Coronary Artery Disease    Post-op Dx:  same  Procedure: CABG X 3 with the LIMA to the LAD and RSVG from the aorta to the PDA and from the aorta to the OM2   Endoscopic greater saphenous vein harvest on the right   Surgeon and Role:      Eugenio Hoes, MD- Primary    * Jillyn Hidden , PA-C - assisting An experienced assistant was required given the complexity of this surgery and the standard of surgical care. The assistant was needed for exposure, dissection, suctioning, retraction of delicate tissues and sutures, instrument exchange and for overall help during this procedure.    Anesthesia  general  Blood Administration: none  Xclamp Time:  50 min  Pump Time:   Drains: Mediastinal chest tubes x 2 and a left pleural chest tube  Wires: 7  Counts: correct   Indications: 82 yo with STEMI treated with balloon angioplasty of LAD. Pt with 100% RCA and distal OM. Awaiting echo. All the risks and goals of surgery along with recovery were discussed with pt and son and they understand and wish to proceed. Targets include LAD, OM and PLAT   Findings: The Left ventricular EF had improved from the previously reported EF of 25% to near 40%. The vessels were visible on surface of heart with the LIMA being a 1.5 mm vessel with moderate disease, the PDA was a 1.5 mm vessel with moderate disease and the distal OM was a 1.5 mm vessel with moderate disease. At conclusion of procedure the pt had EF of 40%  Operative Technique: All invasive lines were placed in pre-op holding.  After the risks, benefits and alternatives were thoroughly discussed, the patient was brought to the operative theatre.  Anesthesia was induced, and the patient was prepped and draped in normal sterile fashion.  An  appropriate surgical pause was performed, and pre-operative antibiotics were dosed accordingly. A median sternotomy incision was then created and sternal divided with a sternal saw.  Simultaneously from the right lower extremity the assistant harvested in the saphenous vein utilizing the Endo tunnel technique and this wound was closed in multiple layers absorbable suture. The left internal mammary artery was harvested and packed in papaverine soaked sponge.  Heparin had been delivered and a pericardial well developed.  The aorta was cannulated with a 20 French aortic cannula and a two-stage cannula was placed right atrium for venous return.  Antegrade and retrograde cardioplegia catheters were placed in the ascending aorta and coronary sinus effectively.  Adequate confirmation of anticoagulation cardiopulmonary bypass was instituted. Cold blood potassium cardioplegia was then delivered antegrade and retrograde for total of 5 minutes following arrest.  An additional dose of delivered at 20 minutes throughout the cross-clamp.  And a hotshot cardioplegia dose delivered just prior to cross-clamp removal. The posterior descending coronary artery and the distal obtuse marginal coronary artery were each individually opened.  Utilizing 2 separate pieces of reverse saphenous vein graft end-to-side anastomoses were constructed utilizing 7-0 Prolene sutures.  There flushed de-aired and found to be hemostatic. The mammary artery was anastomosed to the distal LAD in an end-to-side fashion utilizing 8-0 Prolene sutures it was flushed here and found be hemostatic. The pedicle was pexed the anterior surface of the heart with 5-0 Prolene sutures.  Aortic cross-clamp was removed and a partial clamp placed on the ascending aorta and 2 proximal anastomoses were brought out through 4.0 mm aortic punches.  Proximal vein graft markers were utilized. Aortic partial clamp was removed and the veins were de-aired and routine fashion  and the proximal and distal anastomoses were examined and were hemostatic.  Ventricular and atrial pacing wires were then placed and brought out through inferior stab was and secured. Patient was then weaned from cardiopulmonary bypass on inotropic support.  With adequate hemodynamics protamine was delivered and the patient was decannulated and sites oversewn necessary.  Chest tubes were brought inferior stab was secured and with adequate hemostasis the sternum was reapproximated with interrupted stainless steel wire and the presternal subcutaneous tissue and skin were closed in multiple layers absorbable suture.  Sterile dressings were applied.

## 2022-12-22 NOTE — Interval H&P Note (Signed)
History and Physical Interval Note:  12/22/2022 6:35 AM  Terry White  has presented today for surgery, with the diagnosis of CAD STEMI.  The various methods of treatment have been discussed with the patient and family. After consideration of risks, benefits and other options for treatment, the patient has consented to  Procedure(s): CORONARY ARTERY BYPASS GRAFTING (CABG) (N/A) TRANSESOPHAGEAL ECHOCARDIOGRAM (N/A) as a surgical intervention.  The patient's history has been reviewed, patient examined, no change in status, stable for surgery.  I have reviewed the patient's chart and labs.  Questions were answered to the patient's satisfaction.     Eugenio Hoes

## 2022-12-22 NOTE — Anesthesia Procedure Notes (Signed)
Central Venous Catheter Insertion Performed by: Collene Schlichter, MD, anesthesiologist Start/End9/18/2024 7:50 AM, 12/22/2022 7:55 AM Patient location: Pre-op. Preanesthetic checklist: patient identified, IV checked, site marked, risks and benefits discussed, surgical consent, monitors and equipment checked, pre-op evaluation and timeout performed Position: Trendelenburg Hand hygiene performed  and maximum sterile barriers used  Total catheter length 100. PA cath was placed.Swan type:thermodilution PA Cath depth:47 Procedure performed without using ultrasound guided technique. Attempts: 1 Patient tolerated the procedure well with no immediate complications.

## 2022-12-23 ENCOUNTER — Encounter (HOSPITAL_COMMUNITY): Payer: Self-pay | Admitting: Thoracic Surgery (Cardiothoracic Vascular Surgery)

## 2022-12-23 ENCOUNTER — Inpatient Hospital Stay (HOSPITAL_COMMUNITY): Payer: Medicare PPO

## 2022-12-23 DIAGNOSIS — I2109 ST elevation (STEMI) myocardial infarction involving other coronary artery of anterior wall: Secondary | ICD-10-CM | POA: Diagnosis not present

## 2022-12-23 DIAGNOSIS — I255 Ischemic cardiomyopathy: Secondary | ICD-10-CM | POA: Diagnosis not present

## 2022-12-23 LAB — BASIC METABOLIC PANEL
Anion gap: 10 (ref 5–15)
Anion gap: 15 (ref 5–15)
BUN: 16 mg/dL (ref 8–23)
BUN: 17 mg/dL (ref 8–23)
CO2: 21 mmol/L — ABNORMAL LOW (ref 22–32)
CO2: 22 mmol/L (ref 22–32)
Calcium: 8.7 mg/dL — ABNORMAL LOW (ref 8.9–10.3)
Calcium: 9.1 mg/dL (ref 8.9–10.3)
Chloride: 102 mmol/L (ref 98–111)
Chloride: 96 mmol/L — ABNORMAL LOW (ref 98–111)
Creatinine, Ser: 0.99 mg/dL (ref 0.61–1.24)
Creatinine, Ser: 1.13 mg/dL (ref 0.61–1.24)
GFR, Estimated: >60 mL/min (ref >60)
GFR, Estimated: >60 mL/min (ref >60)
Glucose, Bld: 103 mg/dL — ABNORMAL HIGH (ref 70–99)
Glucose, Bld: 182 mg/dL — ABNORMAL HIGH (ref 70–99)
Potassium: 4.1 mmol/L (ref 3.5–5.1)
Potassium: 3.9 mmol/L (ref 3.5–5.1)
Sodium: 133 mmol/L — ABNORMAL LOW (ref 135–145)
Sodium: 133 mmol/L — ABNORMAL LOW (ref 135–145)

## 2022-12-23 LAB — GLUCOSE, CAPILLARY
Glucose-Capillary: 105 mg/dL — ABNORMAL HIGH (ref 70–99)
Glucose-Capillary: 110 mg/dL — ABNORMAL HIGH (ref 70–99)
Glucose-Capillary: 93 mg/dL (ref 70–99)
Glucose-Capillary: 65 mg/dL — ABNORMAL LOW (ref 70–99)
Glucose-Capillary: 89 mg/dL (ref 70–99)
Glucose-Capillary: 179 mg/dL — ABNORMAL HIGH (ref 70–99)

## 2022-12-23 LAB — MAGNESIUM
Magnesium: 2.3 mg/dL (ref 1.7–2.4)
Magnesium: 1.9 mg/dL (ref 1.7–2.4)

## 2022-12-23 LAB — CBC
HCT: 29 % — ABNORMAL LOW (ref 39.0–52.0)
HCT: 31.1 % — ABNORMAL LOW (ref 39.0–52.0)
Hemoglobin: 9.3 g/dL — ABNORMAL LOW (ref 13.0–17.0)
Hemoglobin: 9.9 g/dL — ABNORMAL LOW (ref 13.0–17.0)
MCH: 31.5 pg (ref 26.0–34.0)
MCH: 31.5 pg (ref 26.0–34.0)
MCHC: 32.1 g/dL (ref 30.0–36.0)
MCHC: 31.8 g/dL (ref 30.0–36.0)
MCV: 98.3 fL (ref 80.0–100.0)
MCV: 99 fL (ref 80.0–100.0)
Platelets: 187 10*3/uL (ref 150–400)
Platelets: 207 10*3/uL (ref 150–400)
RBC: 2.95 MIL/uL — ABNORMAL LOW (ref 4.22–5.81)
RBC: 3.14 MIL/uL — ABNORMAL LOW (ref 4.22–5.81)
RDW: 14.6 % (ref 11.5–15.5)
RDW: 14.8 % (ref 11.5–15.5)
WBC: 12.3 10*3/uL — ABNORMAL HIGH (ref 4.0–10.5)
WBC: 16.5 10*3/uL — ABNORMAL HIGH (ref 4.0–10.5)
nRBC: 0 % (ref 0.0–0.2)
nRBC: 0 % (ref 0.0–0.2)

## 2022-12-23 LAB — ECHO INTRAOPERATIVE TEE
Height: 70 in
Single Plane A4C EF: 44.8 %
Weight: 2440.93 oz

## 2022-12-23 MED ORDER — INSULIN ASPART 100 UNIT/ML IJ SOLN: 0.0000 [IU] | INTRAMUSCULAR | Status: DC

## 2022-12-23 MED ORDER — INSULIN ASPART 100 UNIT/ML IJ SOLN
0.0000 [IU] | INTRAMUSCULAR | Status: DC
  Administered 2022-12-23: 8 [IU] via SUBCUTANEOUS
  Administered 2022-12-23: 4 [IU] via SUBCUTANEOUS
  Administered 2022-12-24 – 2022-12-25 (×5): 2 [IU] via SUBCUTANEOUS
  Administered 2022-12-25: 8 [IU] via SUBCUTANEOUS
  Administered 2022-12-25: 4 [IU] via SUBCUTANEOUS
  Administered 2022-12-26 (×2): 2 [IU] via SUBCUTANEOUS
  Administered 2022-12-26 (×2): 4 [IU] via SUBCUTANEOUS
  Administered 2022-12-27: 2 [IU] via SUBCUTANEOUS

## 2022-12-23 MED ORDER — FUROSEMIDE 10 MG/ML IJ SOLN
40.0000 mg | Freq: Once | INTRAMUSCULAR | Status: AC
  Administered 2022-12-23: 40 mg via INTRAVENOUS
  Filled 2022-12-23: qty 4

## 2022-12-23 MED ORDER — ENOXAPARIN SODIUM 40 MG/0.4ML IJ SOSY
40.0000 mg | PREFILLED_SYRINGE | Freq: Every day | INTRAMUSCULAR | Status: DC
  Administered 2022-12-23 – 2022-12-27 (×5): 40 mg via SUBCUTANEOUS
  Filled 2022-12-23 (×5): qty 0.4

## 2022-12-23 MED ORDER — AMIODARONE HCL 200 MG PO TABS
400.0000 mg | ORAL_TABLET | Freq: Two times a day (BID) | ORAL | Status: DC
  Administered 2022-12-23 – 2022-12-24 (×4): 400 mg via ORAL
  Filled 2022-12-23 (×4): qty 2

## 2022-12-23 MED FILL — Potassium Chloride Inj 2 mEq/ML: INTRAVENOUS | Qty: 40 | Status: AC

## 2022-12-23 MED FILL — Heparin Sodium (Porcine) Inj 1000 Unit/ML: Qty: 1000 | Status: AC

## 2022-12-23 MED FILL — Magnesium Sulfate Inj 50%: INTRAMUSCULAR | Qty: 10 | Status: AC

## 2022-12-23 NOTE — Anesthesia Postprocedure Evaluation (Signed)
Anesthesia Post Note  Patient: Engineer, site  Procedure(s) Performed: CORONARY ARTERY BYPASS GRAFTING (CABG)XTHREE, USING LEFT INTERNAL MAMMARY ARTERY AND RIGHT LEG GREATER SAPHENEOUS VEIN HARVESTED ENDOSCOPICLY (Chest) TRANSESOPHAGEAL ECHOCARDIOGRAM     Patient location during evaluation: SICU Anesthesia Type: General Level of consciousness: sedated Pain management: pain level controlled Vital Signs Assessment: post-procedure vital signs reviewed and stable Respiratory status: patient remains intubated per anesthesia plan Cardiovascular status: stable Postop Assessment: no apparent nausea or vomiting Anesthetic complications: no   No notable events documented.  Last Vitals:  Vitals:   12/23/22 0948 12/23/22 1000  BP: 114/60 118/62  Pulse: 78 78  Resp:  (!) 23  Temp:    SpO2:  98%    Last Pain:  Vitals:   12/23/22 0400  TempSrc: Core  PainSc:    Pain Goal: Patients Stated Pain Goal: 0 (12/22/22 0400)                 Collene Schlichter

## 2022-12-23 NOTE — Progress Notes (Signed)
[address]       [place] [address]             [phone]      1 Day Post-Op  Procedure(s) (LRB): CORONARY ARTERY BYPASS GRAFTING (CABG)XTHREE, USING LEFT INTERNAL MAMMARY ARTERY AND RIGHT LEG GREATER SAPHENEOUS VEIN HARVESTED ENDOSCOPICLY (N/A) TRANSESOPHAGEAL ECHOCARDIOGRAM (N/A)   Total Length of Stay:  LOS: 7 days    SUBJECTIVE: Uncomfortable but otherwise feels ok  Vitals:   12/23/22 0500 12/23/22 0600  BP: 109/72 113/60  Pulse:  80  Resp: 14 16  Temp: 98.2 F (36.8 C) 98.2 F (36.8 C)  SpO2:  94%    Intake/Output      09/18 0701 09/19 0700 09/19 0701 09/20 0700   I.V. (mL/kg) 2111.7 (28.6)    Blood 220    IV Piggyback 7627.2    Total Intake(mL/kg) 9958.9 (134.8)    Urine (mL/kg/hr) 2395 (1.4)    Chest Tube 525    Total Output 2920    Net +7038.9             sodium chloride 10 mL/hr at 12/23/22 0600   sodium chloride     sodium chloride     albumin human 999 mL/hr at 12/23/22 0500    ceFAZolin (ANCEF) IV Stopped (12/23/22 0544)   dexmedetomidine (PRECEDEX) IV infusion Stopped (12/22/22 1511)   insulin Stopped (12/23/22 0510)   lactated ringers     lactated ringers 20 mL/hr at 12/23/22 0600   milrinone     nitroGLYCERIN Stopped (12/22/22 1340)   norepinephrine (LEVOPHED) Adult infusion Stopped (12/22/22 2331)   phenylephrine (NEO-SYNEPHRINE) Adult infusion Stopped (12/22/22 1312)    CBC    Component Value Date/Time   WBC 12.3 (H) 12/23/2022 0402   RBC 2.95 (L) 12/23/2022 0402   HGB 9.3 (L) 12/23/2022 0402   HCT 29.0 (L) 12/23/2022 0402   PLT 187 12/23/2022 0402   MCV 98.3 12/23/2022 0402   MCH 31.5 12/23/2022 0402   MCHC 32.1 12/23/2022 0402   RDW 14.6 12/23/2022 0402   LYMPHSABS 2.5 12/16/2022 0954   MONOABS 0.9 12/16/2022 0954   EOSABS 0.4 12/16/2022 0954   BASOSABS 0.1 12/16/2022 0954   CMP     Component Value Date/Time   NA 133 (L) 12/23/2022 0402   K 4.1 12/23/2022 0402   CL 102 12/23/2022 0402    CO2 21 (L) 12/23/2022 0402   GLUCOSE 103 (H) 12/23/2022 0402   BUN 16 12/23/2022 0402   CREATININE 0.99 12/23/2022 0402   CALCIUM 8.7 (L) 12/23/2022 0402   PROT 8.5 (H) 12/17/2022 1521   ALBUMIN 3.4 (L) 12/17/2022 1521   AST 49 (H) 12/17/2022 1521   ALT 18 12/17/2022 1521   ALKPHOS 158 (H) 12/17/2022 1521   BILITOT 0.6 12/17/2022 1521   GFRNONAA >60 12/23/2022 0402   ABG    Component Value Date/Time   PHART 7.304 (L) 12/22/2022 1824   PCO2ART 44.2 12/22/2022 1824   PO2ART 114 (H) 12/22/2022 1824   HCO3 22.1 12/22/2022 1824   TCO2 23 12/22/2022 1824   ACIDBASEDEF 4.0 (H) 12/22/2022 1824   O2SAT 98 12/22/2022 1824   CBG (last 3)  Recent Labs    12/22/22 1625 12/22/22 1716 12/23/22 0625  GLUCAP 78 110* 105*  Lungs: rhonchorous Card:RR Ext: warm Neuro: intact   ASSESSMENT: POD #1 SP CAB x 3 Doing well, DC swan and aline Lasix Needs aggressive pulmonary toilet and ISB Leave CT for air  leak and volume Start amio prophlaxis Expected blood loss anemia. follow   Eugenio Hoes, MD 12/23/2022

## 2022-12-23 NOTE — Progress Notes (Signed)
Rounding Note    Patient Name: Terry White Date of Encounter: 12/23/2022  [facility] Cardiologist: Verne Carrow, MD (Will establish in [name] post discharge)  Subjective   Chest wall sore.    Inpatient Medications    Scheduled Meds:  acetaminophen  1,000 mg Oral Q6H   Or   acetaminophen (TYLENOL) oral liquid 160 mg/5 mL  1,000 mg Per Tube Q6H   amiodarone  400 mg Oral BID   aspirin EC  325 mg Oral Daily   Or   aspirin  324 mg Per Tube Daily   atorvastatin  80 mg Oral Daily   bisacodyl  10 mg Oral Daily   Or   bisacodyl  10 mg Rectal Daily   Chlorhexidine Gluconate Cloth  6 each Topical Daily   docusate sodium  200 mg Oral Daily   enoxaparin (LOVENOX) injection  40 mg Subcutaneous QHS   insulin aspart  0-24 Units Subcutaneous Q4H   metoCLOPramide (REGLAN) injection  10 mg Intravenous Q6H   metoprolol tartrate  12.5 mg Oral BID   Or   metoprolol tartrate  12.5 mg Per Tube BID   nitroGLYCERIN  1 inch Topical Once   [START ON 12/24/2022] pantoprazole  40 mg Oral Daily   pantoprazole (PROTONIX) IV  40 mg Intravenous QHS   sodium chloride flush  10-40 mL Intracatheter Q12H   sodium chloride flush  3 mL Intravenous Q12H   Continuous Infusions:  sodium chloride 10 mL/hr at 12/23/22 0600   sodium chloride     sodium chloride     albumin human 999 mL/hr at 12/23/22 0500    ceFAZolin (ANCEF) IV Stopped (12/23/22 0544)   lactated ringers     lactated ringers 20 mL/hr at 12/23/22 0600   milrinone     nitroGLYCERIN Stopped (12/22/22 1340)   norepinephrine (LEVOPHED) Adult infusion Stopped (12/22/22 2331)   phenylephrine (NEO-SYNEPHRINE) Adult infusion Stopped (12/22/22 1312)   PRN Meds: sodium chloride, albumin human, dextrose, metoprolol tartrate, morphine injection, ondansetron (ZOFRAN) IV, mouth rinse, oxyCODONE, sodium chloride flush, sodium chloride flush, traMADol   Vital Signs    Vitals:   12/23/22 0300 12/23/22  0400 12/23/22 0500 12/23/22 0600  BP: 111/62 (!) 106/58 109/72 113/60  Pulse: 80 80  80  Resp: 17 17 14 16   Temp: 97.9 F (36.6 C) 98.2 F (36.8 C) 98.2 F (36.8 C) 98.2 F (36.8 C)  TempSrc:  Core    SpO2: 97% 97%  94%  Weight:   73.9 kg   Height:        Intake/Output Summary (Last 24 hours) at 12/23/2022 0806 Last data filed at 12/23/2022 0600 Gross per 24 hour  Intake 9943.99 ml  Output 2920 ml  Net 7023.99 ml      12/23/2022    5:00 AM 12/21/2022    6:00 AM 12/16/2022    9:41 AM  Last 3 Weights  Weight (lbs) 162 lb 14.7 oz 152 lb 8.9 oz 160 lb  Weight (kg) 73.9 kg 69.2 kg 72.576 kg      Telemetry    Sinus - Personally Reviewed  ECG    NSR - Personally Reviewed  Physical Exam   General: Well developed, well nourished, NAD  HEENT: OP clear, mucus membranes moist  SKIN: warm, dry. No rashes. Neuro: No focal deficits  Musculoskeletal: Muscle strength 5/5 all ext  Psychiatric: Mood and affect normal  Neck: No JVD  Lungs:Clear bilaterally, no wheezes, rhonci, crackles Cardiovascular: Regular rate  and rhythm.  Abdomen:Soft. Extremities: No lower extremity edema.  Labs    High Sensitivity Troponin:   Recent Labs  Lab 12/16/22 0954 12/16/22 1329  TROPONINIHS 2,608* 5,137*     Chemistry Recent Labs  Lab 12/16/22 0954 12/17/22 0258 12/17/22 1521 12/18/22 0221 12/20/22 0236 12/21/22 0256 12/21/22 0918 12/22/22 0519 12/22/22 0844 12/22/22 1144 12/22/22 1148 12/22/22 1818 12/22/22 1824 12/23/22 0402  NA 135   < >  --    < > 135   < >  --  132*   < > 136   < > 133* 130* 133*  K 3.0*   < >  --    < > 5.1   < >  --  4.4   < > 5.0   < > 4.3 4.1 4.1  CL 98   < >  --    < > 96*   < >  --  94*   < > 99  --  101  --  102  CO2 24   < >  --    < > 26   < >  --  25  --   --   --  21*  --  21*  GLUCOSE 250*   < >  --    < > 164*   < >  --  145*   < > 104*  --  115*  --  103*  BUN 13   < >  --    < > 16   < >  --  25*   < > 22  --  17  --  16  CREATININE 0.87    < >  --    < > 1.01   < >  --  1.11   < > 0.80  --  0.95  --  0.99  CALCIUM 9.1   < >  --    < > 9.8   < >  --  9.9  --   --   --  8.6*  --  8.7*  MG  --   --   --   --  1.6*  --  1.9  --   --   --   --   --   --  2.3  PROT 7.9  --  8.5*  --   --   --   --   --   --   --   --   --   --   --   ALBUMIN 3.6  --  3.4*  --   --   --   --   --   --   --   --   --   --   --   AST 33  --  49*  --   --   --   --   --   --   --   --   --   --   --   ALT 15  --  18  --   --   --   --   --   --   --   --   --   --   --   ALKPHOS 169*  --  158*  --   --   --   --   --   --   --   --   --   --   --   BILITOT 0.7  --  0.6  --   --   --   --   --   --   --   --   --   --   --   GFRNONAA >60   < >  --    < > >60   < >  --  >60  --   --   --  >60  --  >60  ANIONGAP 13   < >  --    < > 13   < >  --  13  --   --   --  11  --  10   < > = values in this interval not displayed.    Lipids  Recent Labs  Lab 12/16/22 0954  CHOL 203*  TRIG 126  HDL 39*  LDLCALC 139*  CHOLHDL 5.2    Hematology Recent Labs  Lab 12/22/22 1251 12/22/22 1256 12/22/22 1818 12/22/22 1824 12/23/22 0402  WBC 11.8*  --  14.2*  --  12.3*  RBC 2.80*  --  3.05*  --  2.95*  HGB 8.8*   < > 9.2* 8.5* 9.3*  HCT 27.2*   < > 29.9* 25.0* 29.0*  MCV 97.1  --  98.0  --  98.3  MCH 31.4  --  30.2  --  31.5  MCHC 32.4  --  30.8  --  32.1  RDW 14.3  --  14.6  --  14.6  PLT 184  --  221  --  187   < > = values in this interval not displayed.   Thyroid No results for input(s): "TSH", "FREET4" in the last 168 hours.  BNPNo results for input(s): "BNP", "PROBNP" in the last 168 hours.  DDimer No results for input(s): "DDIMER" in the last 168 hours.   Radiology    DG Chest Port 1 View  Result Date: 12/22/2022 CLINICAL DATA:  Status post coronary artery bypass graft. EXAM: PORTABLE CHEST 1 VIEW COMPARISON:  December 16, 2022. FINDINGS: The heart size and mediastinal contours are within normal limits. Endotracheal tube is in grossly good  position. Nasogastric tube is seen entering stomach. Right internal jugular Swan-Ganz catheter is noted with tip in expected position of main pulmonary artery. Stable chronic findings are noted in right lung apex and right lung base. Increased left perihilar opacity is noted concerning for possible inflammation or asymmetric edema. Left-sided chest tube is noted with probable minimal left apical pneumothorax. The visualized skeletal structures are unremarkable. IMPRESSION: Left-sided chest tube is noted with possible minimal left apical pneumothorax. New left perihilar opacity is noted concerning for possible infiltrate or asymmetric edema. Stable chronic opacities are noted in right lung. Endotracheal and nasogastric tubes are in grossly good position. Electronically Signed   By: Lupita Raider M.D.   On: 12/22/2022 14:19   EP STUDY  Result Date: 12/22/2022 See surgical note for result.   Cardiac Studies   Echo 12/16/22:  1. Left ventricular ejection fraction, by estimation, is 25 to 30%. The  left ventricle has severely decreased function. The left ventricle  demonstrates regional wall motion abnormalities with mid to apical  anteroseptal/inferoseptal akinesis, apical  anterior/inferior/lateral akinesis, and akinesis of the true apex. No LV  thrombus noted. There is mild concentric left ventricular hypertrophy.  Left ventricular diastolic parameters are consistent with Grade I  diastolic dysfunction (impaired relaxation).   2. Right ventricular systolic function is normal. The right ventricular  size is normal. There is normal pulmonary artery systolic  pressure. The  estimated right ventricular systolic pressure is 21.5 mmHg.   3. The mitral valve is degenerative. Mild mitral valve regurgitation. No  evidence of mitral stenosis.   4. The aortic valve is tricuspid. There is mild calcification of the  aortic valve. Aortic valve regurgitation is not visualized. Aortic valve  sclerosis is  present, with no evidence of aortic valve stenosis.   5. The inferior vena cava is normal in size with <50% respiratory  variability, suggesting right atrial pressure of 8 mmHg.   Patient Profile     82 y.o. 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   Assessment & Plan    CAD/Anterior STEMI: POD day 1 following 3V CABG. Hemodynamically stable today. Sinus rhythm. Continue ASA, statin and beta blocker.     For questions or updates, please contact  [facility] Please consult [email or web address] for contact info under        Signed, Verne Carrow, MD  12/23/2022, 8:06 AM

## 2022-12-23 NOTE — Consult Note (Signed)
NAME:  Terry White, MRN:  [id number], DOB:  [birth year], LOS: 7 ADMISSION DATE:  12/16/2022, CONSULTATION DATE:  9/18 REFERRING MD:  Dr. Leafy Ro, CHIEF COMPLAINT:  CABG x 3  History of Present Illness:  82 year old male admitted 9/12 w/ chest pain and anterior STEMI. Went to cath lab found to have severe multi-vessel CAD. Only able to balloon the distal LAD which did help w/ pain, Her EF was 30%, based of the extensiveness of CAD was referred to cardiac surgery for CABG Was admitted to the ICU for medical optimization and then went for CABG x3 on 9/18. 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 LAD treated with emergency balloon angioplasty  9/18 CABG x3  Interim History / Subjective:  Post op intubated/sedated  Objective   Blood pressure 113/60, pulse 80, temperature 98.2 F (36.8 C), resp. rate 16, height 5\' 10"  (1.778 m), weight 73.9 kg, SpO2 94%. PAP: (15-33)/(9-25) 29/25 CO:  [3.4 L/min-4.6 L/min] 4.6 L/min CI:  [1.8 L/min/m2-2.5 L/min/m2] 2.47 L/min/m2  Vent Mode: CPAP;PSV FiO2 (%):  [40 %-50 %] 40 % Set Rate:  [4 bmp-16 bmp] 4 bmp Vt Set:  [500 mL] 500 mL PEEP:  [5 cmH20] 5 cmH20 Pressure Support:  [10 cmH20] 10 cmH20 Plateau Pressure:  [13 cmH20] 13 cmH20   Intake/Output Summary (Last 24 hours) at 12/23/2022 0748 Last data filed at 12/23/2022 0600 Gross per 24 hour  Intake 9943.99 ml  Output 2920 ml  Net 7023.99 ml   Filed Weights   12/16/22 0941 12/21/22 0600 12/23/22 0500  Weight: 72.6 kg 69.2 kg  73.9 kg    Examination: General:  critically ill appearing on mech vent HEENT: MM pink/moist; ETT in place Neuro: sedate CV: s1s2, paced rate 80s, no m/r/g; CT in place PULM:  dim crackles BS bilaterally; on mech vent PRVC GI: soft, bsx4 active  Extremities: warm/dry, no edema  Skin: no rashes or lesions    Resolved Hospital Problem list     Assessment & Plan:   Postoperative vent management Plan: -extubated yesterday -cont  and wean for sats >92% -pulm toiletry  S/p 3-vessel CABG STEMI w/ Multivessel 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) ICM HTN HLD Plan: - cards and TCTS following; appreciate recs - Continue ASA and statin - Postoperative care per TCTS - amio and metoprolol post op - CT management per protocol  Type II DM w/ hyperglycemia Plan: -currently on insulin gtt and will likely transition to ssi -cbg monitoring   Best Practice (right click and "Reselect all SmartList Selections" daily)   Diet/type: cards diet DVT prophylaxis: lovenox GI prophylaxis: PPI Lines: Central line and Arterial Line Foley:  Yes, and it is still needed Code Status:  full code Last date of multidisciplinary goals of care discussion [per primary]  Labs   CBC: Recent Labs  Lab 12/16/22 0954 12/17/22 0258 12/21/22 0256 12/22/22 0519 12/22/22 0844 12/22/22 1103 12/22/22 1122 12/22/22 1251 12/22/22 1256 12/22/22 1717 12/22/22 1818 12/22/22 1824 12/23/22 0402  WBC 15.4*   < > 11.3* 11.7*  --   --   --  11.8*  --   --  14.2*  --  12.3*  NEUTROABS 11.4*  --   --   --   --   --   --   --   --   --   --   --   --   HGB 13.9   < > 12.8* 12.6*   < > 7.4*   < > 8.8* 9.2* 9.9* 9.2* 8.5* 9.3*  HCT 41.9   < > 39.5 39.3   < > 23.9*   < > 27.2* 27.0* 29.0* 29.9* 25.0* 29.0*  MCV 94.4   < > 96.3 94.5  --   --   --  97.1  --   --  98.0  --  98.3  PLT 317   < > 323 347  --  133*  --  184  --   --  221  --  187   < > = values in this interval not displayed.    Basic Metabolic  Panel: Recent Labs  Lab 12/20/22 0236 12/21/22 0256 12/21/22 0918 12/22/22 0519 12/22/22 0844 12/22/22 1033 12/22/22 1054 12/22/22 1122 12/22/22 1130 12/22/22 1144 12/22/22 1148 12/22/22 1256 12/22/22 1717 12/22/22 1818 12/22/22 1824 12/23/22 0402  NA 135 136  --  132*   < > 134*   < > 135   < > 136   < > 137 135 133* 130* 133*  K 5.1 4.8  --  4.4   < > 5.0   < > 5.4*   < > 5.0   < > 4.2 4.3 4.3 4.1 4.1  CL 96* 98  --  94*   < > 97*  --  97*  --  99  --   --   --  101  --  102  CO2 26 26  --  25  --   --   --   --   --   --   --   --   --  21*  --  21*  GLUCOSE 164* 137*  --  145*   < > 110*  --  100*  --  104*  --   --   --  115*  --  103*  BUN 16 18  --  25*   < > 22  --  22  --  22  --   --   --  17  --  16  CREATININE 1.01 1.07  --  1.11   < > 0.80  --  0.80  --  0.80  --   --   --  0.95  --  0.99  CALCIUM 9.8 9.9  --  9.9  --   --   --   --   --   --   --   --   --  8.6*  --  8.7*  MG 1.6*  --  1.9  --   --   --   --   --   --   --   --   --   --   --   --  2.3   < > = values in this interval not displayed.   GFR: Estimated Creatinine Clearance: 62.5 mL/min (by C-G formula based on SCr of 0.99 mg/dL). Recent Labs  Lab 12/16/22 1329 12/17/22 0258 12/17/22 1521 12/17/22 1956 12/18/22 0221 12/18/22 1219 12/19/22 0439 12/22/22 0519 12/22/22 1251 12/22/22 1818 12/23/22 0402  WBC  --    < >  --   --    < >  --    < >  11.7* 11.8* 14.2* 12.3*  LATICACIDVEN 2.5*  --  3.0* 2.4*  --  1.5  --   --   --   --   --    < > = values in this interval not displayed.    Liver Function Tests: Recent Labs  Lab 12/16/22 0954 12/17/22 1521  AST 33 49*  ALT 15 18  ALKPHOS 169* 158*  BILITOT 0.7 0.6  PROT 7.9 8.5*  ALBUMIN 3.6 3.4*   No results for input(s): "LIPASE", "AMYLASE" in the last 168 hours. No results for input(s): "AMMONIA" in the last 168 hours.  ABG    Component Value Date/Time   PHART 7.304 (L) 12/22/2022 1824   PCO2ART 44.2 12/22/2022 1824   PO2ART 114  (H) 12/22/2022 1824   HCO3 22.1 12/22/2022 1824   TCO2 23 12/22/2022 1824   ACIDBASEDEF 4.0 (H) 12/22/2022 1824   O2SAT 98 12/22/2022 1824     Coagulation Profile: Recent Labs  Lab 12/16/22 0954 12/22/22 0519 12/22/22 1251  INR 0.9 1.0 1.3*    Cardiac Enzymes: No results for input(s): "CKTOTAL", "CKMB", "CKMBINDEX", "TROPONINI" in the last 168 hours.  HbA1C: Hgb A1c MFr Bld  Date/Time Value Ref Range Status  12/16/2022 09:54 AM 9.1 (H) 4.8 - 5.6 % Final    Comment:    (NOTE)         Prediabetes: 5.7 - 6.4         Diabetes: >6.4         Glycemic control for adults with diabetes: <7.0     CBG: Recent Labs  Lab 12/22/22 1432 12/22/22 1533 12/22/22 1625 12/22/22 1716 12/23/22 0625  GLUCAP 108* 104* 78 110* 105*    Review of Systems:   Patient is encephalopathic and/or intubated; therefore, history has been obtained from chart review.    Past Medical History:  He,  has a past medical history of Diabetes mellitus without complication (HCC), Gout, and HTN (hypertension).   Surgical History:   Past Surgical History:  Procedure Laterality Date   CORONARY ARTERY BYPASS GRAFT N/A 12/22/2022   Procedure: CORONARY ARTERY BYPASS GRAFTING (CABG)XTHREE, USING LEFT INTERNAL MAMMARY ARTERY AND RIGHT LEG GREATER SAPHENEOUS VEIN HARVESTED ENDOSCOPICLY;  Surgeon: Eugenio Hoes, MD;  Location: [facility] OR;  Service: Open Heart Surgery;  Laterality: N/A;   CORONARY/GRAFT ACUTE MI REVASCULARIZATION N/A 12/16/2022   Procedure: Coronary/Graft Acute MI Revascularization;  Surgeon: Kathleene Hazel, MD;  Location: [facility] INVASIVE CV LAB;  Service: Cardiovascular;  Laterality: N/A;   LEFT HEART CATH AND CORONARY ANGIOGRAPHY N/A 12/16/2022   Procedure: LEFT HEART CATH AND CORONARY ANGIOGRAPHY;  Surgeon: Kathleene Hazel, MD;  Location: [facility] INVASIVE CV LAB;  Service: Cardiovascular;  Laterality: N/A;   TEE WITHOUT CARDIOVERSION N/A 12/22/2022   Procedure: TRANSESOPHAGEAL ECHOCARDIOGRAM;   Surgeon: Eugenio Hoes, MD;  Location: [facility] OR;  Service: Open Heart Surgery;  Laterality: N/A;     Social History:   reports that he quit smoking about 16 years ago. His smoking use included cigarettes. He has never used smokeless tobacco. He reports that he does not drink alcohol and does not use drugs.   Family History:  His family history is not on file.   Allergies No Known Allergies   Home Medications  Prior to Admission medications   Medication Sig Start Date End Date Taking? Authorizing Provider  allopurinol (ZYLOPRIM) 300 MG tablet Take 300 mg by mouth at bedtime.   Yes [provider]  ibuprofen (ADVIL) 200 MG tablet Take 400-800  mg by mouth 2 (two) times daily as needed for moderate pain or headache.   Yes [provider]  metFORMIN (GLUCOPHAGE-XR) 500 MG 24 hr tablet Take 500 mg by mouth at bedtime.   Yes [provider]  Omega-3 Fatty Acids (FISH OIL PO) Take 1 capsule by mouth at bedtime.   Yes [provider]     Critical care time: 35 minutes     JD Anselm Lis  Pulmonary & Critical Care 12/23/2022, 7:48 AM  Please see [email or web address] for pager details.  From 7A-7P if no response, please call [phone]. After hours, please call ELink [phone].

## 2022-12-24 ENCOUNTER — Inpatient Hospital Stay (HOSPITAL_COMMUNITY): Payer: Medicare PPO

## 2022-12-24 DIAGNOSIS — I2109 ST elevation (STEMI) myocardial infarction involving other coronary artery of anterior wall: Secondary | ICD-10-CM | POA: Diagnosis not present

## 2022-12-24 DIAGNOSIS — I255 Ischemic cardiomyopathy: Secondary | ICD-10-CM | POA: Diagnosis not present

## 2022-12-24 LAB — GLUCOSE, CAPILLARY
Glucose-Capillary: 125 mg/dL — ABNORMAL HIGH (ref 70–99)
Glucose-Capillary: 137 mg/dL — ABNORMAL HIGH (ref 70–99)
Glucose-Capillary: 146 mg/dL — ABNORMAL HIGH (ref 70–99)
Glucose-Capillary: 146 mg/dL — ABNORMAL HIGH (ref 70–99)
Glucose-Capillary: 94 mg/dL (ref 70–99)
Glucose-Capillary: 95 mg/dL (ref 70–99)

## 2022-12-24 LAB — CBC
HCT: 30.2 % — ABNORMAL LOW (ref 39.0–52.0)
Hemoglobin: 9.7 g/dL — ABNORMAL LOW (ref 13.0–17.0)
MCH: 31.6 pg (ref 26.0–34.0)
MCHC: 32.1 g/dL (ref 30.0–36.0)
MCV: 98.4 fL (ref 80.0–100.0)
Platelets: 206 10*3/uL (ref 150–400)
RBC: 3.07 MIL/uL — ABNORMAL LOW (ref 4.22–5.81)
RDW: 14.9 % (ref 11.5–15.5)
WBC: 16.4 10*3/uL — ABNORMAL HIGH (ref 4.0–10.5)
nRBC: 0 % (ref 0.0–0.2)

## 2022-12-24 LAB — BASIC METABOLIC PANEL
Anion gap: 14 (ref 5–15)
BUN: 19 mg/dL (ref 8–23)
CO2: 25 mmol/L (ref 22–32)
Calcium: 9.3 mg/dL (ref 8.9–10.3)
Chloride: 95 mmol/L — ABNORMAL LOW (ref 98–111)
Creatinine, Ser: 1.06 mg/dL (ref 0.61–1.24)
GFR, Estimated: >60 mL/min (ref >60)
Glucose, Bld: 110 mg/dL — ABNORMAL HIGH (ref 70–99)
Potassium: 4 mmol/L (ref 3.5–5.1)
Sodium: 134 mmol/L — ABNORMAL LOW (ref 135–145)

## 2022-12-24 MED ORDER — SODIUM CHLORIDE 0.9% FLUSH
3.0000 mL | Freq: Two times a day (BID) | INTRAVENOUS | Status: DC
  Administered 2022-12-24 – 2022-12-28 (×8): 3 mL via INTRAVENOUS

## 2022-12-24 MED ORDER — SODIUM CHLORIDE 0.9% FLUSH: 3.0000 mL | INTRAVENOUS | Status: DC | PRN

## 2022-12-24 MED ORDER — ~~LOC~~ CARDIAC SURGERY, PATIENT & FAMILY EDUCATION: Freq: Once | Status: AC

## 2022-12-24 MED ORDER — SODIUM CHLORIDE 0.9 % IV SOLN: 250.0000 mL | INTRAVENOUS | Status: DC | PRN

## 2022-12-24 NOTE — Plan of Care (Signed)

## 2022-12-24 NOTE — Progress Notes (Signed)
Came to ambulate, pt just had gotten in bed and lunch is awaiting. Will f/u as time allows.  Ethelda Chick BS, ACSM-CEP 12/24/2022 2:49 PM

## 2022-12-24 NOTE — Progress Notes (Signed)
[address]       [place] [address]             [phone]      2 Days Post-Op  Procedure(s) (LRB): CORONARY ARTERY BYPASS GRAFTING (CABG)XTHREE, USING LEFT INTERNAL MAMMARY ARTERY AND RIGHT LEG GREATER SAPHENEOUS VEIN HARVESTED ENDOSCOPICLY (N/A) TRANSESOPHAGEAL ECHOCARDIOGRAM (N/A)   Total Length of Stay:  LOS: 8 days    SUBJECTIVE: Feels well  Vitals:   12/24/22 0600 12/24/22 0700  BP: 119/65 (!) 109/48  Pulse: 72 75  Resp: 12 17  Temp: 98.6 F (37 C) 98.6 F (37 C)  SpO2: 90% 98%    Intake/Output      09/19 0701 09/20 0700 09/20 0701 09/21 0700   P.O. 140    I.V. (mL/kg) 146.3 (2)    Blood     IV Piggyback 330.7    Total Intake(mL/kg) 617 (8.3)    Urine (mL/kg/hr) 1160 (0.7)    Chest Tube 240    Total Output 1400    Net -783             sodium chloride Stopped (12/23/22 0955)   sodium chloride     sodium chloride     albumin human 999 mL/hr at 12/23/22 1100   lactated ringers     lactated ringers Stopped (12/23/22 1119)   milrinone     nitroGLYCERIN Stopped (12/22/22 1340)   norepinephrine (LEVOPHED) Adult infusion Stopped (12/22/22 2331)   phenylephrine (NEO-SYNEPHRINE) Adult infusion Stopped (12/22/22 1312)    CBC    Component Value Date/Time   WBC 16.4 (H) 12/24/2022 0328   RBC 3.07 (L) 12/24/2022 0328   HGB 9.7 (L) 12/24/2022 0328   HCT 30.2 (L) 12/24/2022 0328   PLT 206 12/24/2022 0328   MCV 98.4 12/24/2022 0328   MCH 31.6 12/24/2022 0328   MCHC 32.1 12/24/2022 0328   RDW 14.9 12/24/2022 0328   LYMPHSABS 2.5 12/16/2022 0954   MONOABS 0.9 12/16/2022 0954   EOSABS 0.4 12/16/2022 0954   BASOSABS 0.1 12/16/2022 0954   CMP     Component Value Date/Time   NA 134 (L) 12/24/2022 0328   K 4.0 12/24/2022 0328   CL 95 (L) 12/24/2022 0328   CO2 25 12/24/2022 0328   GLUCOSE 110 (H) 12/24/2022 0328   BUN 19 12/24/2022 0328   CREATININE 1.06 12/24/2022 0328   CALCIUM 9.3 12/24/2022 0328   PROT 8.5 (H)  12/17/2022 1521   ALBUMIN 3.4 (L) 12/17/2022 1521   AST 49 (H) 12/17/2022 1521   ALT 18 12/17/2022 1521   ALKPHOS 158 (H) 12/17/2022 1521   BILITOT 0.6 12/17/2022 1521   GFRNONAA >60 12/24/2022 0328   ABG    Component Value Date/Time   PHART 7.304 (L) 12/22/2022 1824   PCO2ART 44.2 12/22/2022 1824   PO2ART 114 (H) 12/22/2022 1824   HCO3 22.1 12/22/2022 1824   TCO2 23 12/22/2022 1824   ACIDBASEDEF 4.0 (H) 12/22/2022 1824   O2SAT 98 12/22/2022 1824   CBG (last 3)  Recent Labs    12/23/22 1615 12/24/22 0032 12/24/22 0330  GLUCAP 179* 146* 94  EXAM Lungs: clear Card: RR Ext: warm Neuro: intact CT with air leak   ASSESSMENT: POD #2 SP CABG Hemodynamics ok Pulm: cxr looks like patchy infiltrates. Not unsimilar to preop. ISB Leave CT but put to water seal Lasix today Transfer to floor today with chest tubes   Eugenio Hoes, MD 12/24/2022

## 2022-12-24 NOTE — Progress Notes (Signed)
Pt arrived from [facility]. CHG bath, tele box [id number], CCMD called and verified x2. Pt oriented to unit. Pt resting with call bell within reach.  Will continue to monitor.
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
## 2022-12-24 NOTE — Progress Notes (Signed)
Rounding Note    Patient Name: Terry White Date of Encounter: 12/24/2022  [facility] Cardiologist: Verne Carrow, MD (Will establish in [facility] post discharge)  Subjective   No chest pain or dyspnea. No events overnight  Inpatient Medications    Scheduled Meds:  acetaminophen  1,000 mg Oral Q6H   Or   acetaminophen (TYLENOL) oral liquid 160 mg/5 mL  1,000 mg Per Tube Q6H   amiodarone  400 mg Oral BID   aspirin EC  325 mg Oral Daily   Or   aspirin  324 mg Per Tube Daily   atorvastatin  80 mg Oral Daily   bisacodyl  10 mg Oral Daily   Or   bisacodyl  10 mg Rectal Daily   Chlorhexidine Gluconate Cloth  6 each Topical Daily   docusate sodium  200 mg Oral Daily   enoxaparin (LOVENOX) injection  40 mg Subcutaneous QHS   insulin aspart  0-24 Units Subcutaneous Q4H   metoprolol tartrate  12.5 mg Oral BID   Or   metoprolol tartrate  12.5 mg Per Tube BID   nitroGLYCERIN  1 inch Topical Once   pantoprazole  40 mg Oral Daily   sodium chloride flush  10-40 mL Intracatheter Q12H   sodium chloride flush  3 mL Intravenous Q12H   Continuous Infusions:  sodium chloride Stopped (12/23/22 0955)   sodium chloride     sodium chloride     albumin human 999 mL/hr at 12/23/22 1100   lactated ringers     lactated ringers Stopped (12/23/22 1119)   milrinone     nitroGLYCERIN Stopped (12/22/22 1340)   norepinephrine (LEVOPHED) Adult infusion Stopped (12/22/22 2331)   phenylephrine (NEO-SYNEPHRINE) Adult infusion Stopped (12/22/22 1312)   PRN Meds: sodium chloride, albumin human, dextrose, metoprolol tartrate, morphine injection, ondansetron (ZOFRAN) IV, mouth rinse, oxyCODONE, sodium chloride flush, sodium chloride flush, traMADol   Vital Signs    Vitals:   12/24/22 0300 12/24/22 0400 12/24/22 0500 12/24/22 0600  BP:  (!) 100/58 (!) 105/56 119/65  Pulse: 77 68 68 72  Resp: 15 13 11 12   Temp:  98.6 F (37 C) 98.6 F (37 C) 98.6 F (37  C)  TempSrc:  Bladder    SpO2: (!) 88% 98% 96% 90%  Weight:  74 kg    Height:        Intake/Output Summary (Last 24 hours) at 12/24/2022 0658 Last data filed at 12/24/2022 0600 Gross per 24 hour  Intake 501.65 ml  Output 1400 ml  Net -898.35 ml      12/24/2022    4:00 AM 12/23/2022    5:00 AM 12/21/2022    6:00 AM  Last 3 Weights  Weight (lbs) 163 lb 2.3 oz 162 lb 14.7 oz 152 lb 8.9 oz  Weight (kg) 74 kg 73.9 kg 69.2 kg      Telemetry    Sinus - Personally Reviewed  ECG    No tracing - Personally Reviewed  Physical Exam    General: Well developed, well nourished, NAD  HEENT: OP clear, mucus membranes moist  SKIN: warm, dry. No rashes. Neuro: No focal deficits  Musculoskeletal: Muscle strength 5/5 all ext  Psychiatric: Mood and affect normal  Neck: No JVD Lungs:Clear bilaterally, no wheezes, rhonci, crackles Cardiovascular: Regular rate and rhythm. No murmurs, gallops or rubs. Abdomen:Soft. Bowel sounds present. Non-tender.  Extremities: No lower extremity edema   Labs    High Sensitivity Troponin:   Recent Labs  Lab 12/16/22 0954 12/16/22 1329  TROPONINIHS 2,608* 5,137*     Chemistry Recent Labs  Lab 12/17/22 1521 12/18/22 0221 12/21/22 0918 12/22/22 0519 12/23/22 0402 12/23/22 1700 12/24/22 0328  NA  --    < >  --    < > 133* 133* 134*  K  --    < >  --    < > 4.1 3.9 4.0  CL  --    < >  --    < > 102 96* 95*  CO2  --    < >  --    < > 21* 22 25  GLUCOSE  --    < >  --    < > 103* 182* 110*  BUN  --    < >  --    < > 16 17 19   CREATININE  --    < >  --    < > 0.99 1.13 1.06  CALCIUM  --    < >  --    < > 8.7* 9.1 9.3  MG  --    < > 1.9  --  2.3 1.9  --   PROT 8.5*  --   --   --   --   --   --   ALBUMIN 3.4*  --   --   --   --   --   --   AST 49*  --   --   --   --   --   --   ALT 18  --   --   --   --   --   --   ALKPHOS 158*  --   --   --   --   --   --   BILITOT 0.6  --   --   --   --   --   --   GFRNONAA  --    < >  --    < > >60 >60  >60  ANIONGAP  --    < >  --    < > 10 15 14    < > = values in this interval not displayed.    Lipids  No results for input(s): "CHOL", "TRIG", "HDL", "LABVLDL", "LDLCALC", "CHOLHDL" in the last 168 hours.   Hematology Recent Labs  Lab 12/23/22 0402 12/23/22 1700 12/24/22 0328  WBC 12.3* 16.5* 16.4*  RBC 2.95* 3.14* 3.07*  HGB 9.3* 9.9* 9.7*  HCT 29.0* 31.1* 30.2*  MCV 98.3 99.0 98.4  MCH 31.5 31.5 31.6  MCHC 32.1 31.8 32.1  RDW 14.6 14.8 14.9  PLT 187 207 206   Thyroid No results for input(s): "TSH", "FREET4" in the last 168 hours.  BNPNo results for input(s): "BNP", "PROBNP" in the last 168 hours.  DDimer No results for input(s): "DDIMER" in the last 168 hours.   Radiology    ECHO INTRAOPERATIVE TEE  Result Date: 12/23/2022  *INTRAOPERATIVE TRANSESOPHAGEAL REPORT *  Patient Name:   Terry White Date of Exam: 12/22/2022 Medical Rec #:  [id number]      Height:       70.0 in Accession #:    [id number]     Weight:       152.6 lb Date of Birth:  05-16-1940      BSA:          1.86 m Patient Age:    81 years       BP:  130/81 mmHg Patient Gender: M              HR:           68 bpm. Exam Location:  Anesthesiology Transesophogeal exam was perform intraoperatively during surgical procedure. Patient was closely monitored under general anesthesia during the entirety of examination. Indications:     I25.110 Atherosclerotic heart disease of native coronary artery                  with unstable angina pectoris Performing Phys: [id number] Eugenio Hoes Diagnosing Phys: Arrie Aran MD PROCEDURE: Intraoperative Transesophogeal Unable to advance TEE probe into stomach. Could advance probe to 35cm. Exam limited to mid-esophageal views only. Surgeon aware. Complications: No known complications during this procedure. POST-OP IMPRESSIONS _ Left Ventricle: has mildly reduced systolic function, with an ejection fraction of 40%. The cavity size was normal. The wall motion is abnormal with regional  variation. _ Right Ventricle: The right ventricle appears unchanged from pre-bypass. _ Aorta: The aorta appears unchanged from pre-bypass. _ Left Atrial Appendage: The left atrial appendage appears unchanged from pre-bypass. _ Aortic Valve: The aortic valve appears unchanged from pre-bypass. _ Mitral Valve: There is mild regurgitation. _ Tricuspid Valve: There is no regurgitation. _ Pulmonic Valve: The pulmonic valve appears unchanged from pre-bypass. _ Interatrial Septum: The interatrial septum appears unchanged from pre-bypass. _ Pericardium: The pericardium appears unchanged from pre-bypass. _ Comments: Post-bypass images reviewed with surgeon. Limitations of exam unchanged from pre-bypass exam. PRE-OP FINDINGS  Left Ventricle: The left ventricle has mild-moderately reduced systolic function, with an ejection fraction of 40-45%. The cavity size was normal. Septal and apical hypokinesis. Right Ventricle: The right ventricle has normal systolic function. The cavity was normal. There is no increase in right ventricular wall thickness. Left Atrium: Left atrial size was normal in size. No left atrial/left atrial appendage thrombus was detected. Right Atrium: Right atrial size was normal in size. Interatrial Septum: No atrial level shunt detected by color flow Doppler. Pericardium: There is no evidence of pericardial effusion. 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 Mitral valve regurgitation is mild by color flow Doppler. Tricuspid Valve: The tricuspid valve was normal in structure. Tricuspid valve regurgitation is trivial by color flow Doppler. Aortic Valve: The aortic valve is tricuspid Aortic valve regurgitation was not visualized by color flow Doppler. Pulmonic Valve: The pulmonic valve was normal in structure. Pulmonic valve regurgitation is mild by color flow Doppler. Aorta: The aortic root, ascending aorta and aortic arch are normal in size and structure. The descending aorta was not well visualized.  Unable to visualize descending aorta.  +------------------+---------++ LV Volumes (MOD)            +------------------+---------++ LV area d, A4C:   15.10 cm +------------------+---------++ LV area s, A4C:   11.80 cm +------------------+---------++ LV major d, A4C:  6.04 cm   +------------------+---------++ LV major s, A4C:  6.82 cm   +------------------+---------++ LV vol d, MOD A4C:30.8 ml   +------------------+---------++ LV vol s, MOD A4C:17.0 ml   +------------------+---------++ LV SV MOD A4C:    30.8 ml   +------------------+---------++  Arrie Aran MD Electronically signed by Arrie Aran MD Signature Date/Time: 12/23/2022/1:19:37 PM    Final    DG Chest Port 1 View  Result Date: 12/23/2022 CLINICAL DATA:  Status post CABG. EXAM: PORTABLE CHEST 1 VIEW COMPARISON:  Radiograph yesterday FINDINGS: Extubation and removal of enteric tube. Right internal jugular Swan-Ganz catheter tip in the region of the main pulmonary outflow tract. Left-sided  chest tube in place. Small left apical pneumothorax is unchanged. There is a small right apical pneumothorax visualized just above the posterior right third rib. Retrospectively this is unchanged. Mediastinal drains. Status post median sternotomy with stable heart size and mediastinal contours. No large pleural effusion. Diffuse coarsened lung markings and scarring in the right upper lobe. IMPRESSION: 1. Extubation and removal of enteric tube. Remaining support apparatus are unchanged. 2. Unchanged small left apical pneumothorax. There is also a small right pneumothorax that is retrospectively unchanged from prior. Electronically Signed   By: Narda Rutherford M.D.   On: 12/23/2022 08:55   DG Chest Port 1 View  Result Date: 12/22/2022 CLINICAL DATA:  Status post coronary artery bypass graft. EXAM: PORTABLE CHEST 1 VIEW COMPARISON:  December 16, 2022. FINDINGS: The heart size and mediastinal contours are within normal limits.  Endotracheal tube is in grossly good position. Nasogastric tube is seen entering stomach. Right internal jugular Swan-Ganz catheter is noted with tip in expected position of main pulmonary artery. Stable chronic findings are noted in right lung apex and right lung base. Increased left perihilar opacity is noted concerning for possible inflammation or asymmetric edema. Left-sided chest tube is noted with probable minimal left apical pneumothorax. The visualized skeletal structures are unremarkable. IMPRESSION: Left-sided chest tube is noted with possible minimal left apical pneumothorax. New left perihilar opacity is noted concerning for possible infiltrate or asymmetric edema. Stable chronic opacities are noted in right lung. Endotracheal and nasogastric tubes are in grossly good position. Electronically Signed   By: Lupita Raider M.D.   On: 12/22/2022 14:19   EP STUDY  Result Date: 12/22/2022 See surgical note for result.   Cardiac Studies   Echo 12/16/22:  1. Left ventricular ejection fraction, by estimation, is 25 to 30%. The  left ventricle has severely decreased function. The left ventricle  demonstrates regional wall motion abnormalities with mid to apical  anteroseptal/inferoseptal akinesis, apical  anterior/inferior/lateral akinesis, and akinesis of the true apex. No LV  thrombus noted. There is mild concentric left ventricular hypertrophy.  Left ventricular diastolic parameters are consistent with Grade I  diastolic dysfunction (impaired relaxation).   2. Right ventricular systolic function is normal. The right ventricular  size is normal. There is normal pulmonary artery systolic pressure. The  estimated right ventricular systolic pressure is 21.5 mmHg.   3. The mitral valve is degenerative. Mild mitral valve regurgitation. No  evidence of mitral stenosis.   4. The aortic valve is tricuspid. There is mild calcification of the  aortic valve. Aortic valve regurgitation is not  visualized. Aortic valve  sclerosis is present, with no evidence of aortic valve stenosis.   5. The inferior vena cava is normal in size with <50% respiratory  variability, suggesting right atrial pressure of 8 mmHg.   Patient Profile     82 y.o. 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   Assessment & Plan    CAD/Anterior STEMI: POD day 2 following 3V CABG. Stable today. He remains in sinus. Continue ASA, beta blocker and statin.      For questions or updates, please contact [facility] Please consult [email or web address] for contact info under        Signed, Verne Carrow, MD  12/24/2022, 6:58 AM

## 2022-12-24 NOTE — Progress Notes (Signed)
NAME:  Terry White, MRN:  [id number], DOB:  [birth year], LOS: 8 ADMISSION DATE:  12/16/2022, CONSULTATION DATE:  9/18 REFERRING MD:  Dr. Leafy Ro, CHIEF COMPLAINT:  CABG x 3  History of Present Illness:  82 year old male admitted 9/12 w/ chest pain and anterior STEMI. Went to cath lab found to have severe multi-vessel CAD. Only able to balloon the distal LAD which did help w/ pain, Her EF was 30%, based of the extensiveness of CAD was referred to cardiac surgery for CABG Was admitted to the ICU for medical optimization and then went for CABG x3 on 9/18. 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 LAD treated with emergency balloon angioplasty  9/18 CABG x3.  Extubated 9/18 9/20 some nausea and vomiting, but awaiting transfer to floor  Interim History / Subjective:  Having episodes of nausea and vomiting this morning otherwise stable  Objective   Blood pressure (!) 109/48, pulse 75, temperature 98.6 F (37 C), resp. rate 17, height 5\' 10"  (1.778 m), weight 74 kg, SpO2 98%. PAP: (23-25)/(20-22) 23/20      Intake/Output Summary (Last 24 hours) at 12/24/2022 0731 Last data filed at 12/24/2022 0700 Gross per 24 hour  Intake 616.98 ml  Output 1400 ml  Net -783.02 ml   Filed Weights   12/21/22 0600 12/23/22 0500 12/24/22 0400  Weight: 69.2 kg 73.9 kg 74 kg    Examination: General This is a 82 year old male patient currently sitting up in the chair he is in no acute distress but just completed vomiting HEENT  normocephalic atraumatic no jugular venous distention appreciated IJ catheter unremarkable Pulmonary some basilar rales , Currently on 3 L/min via nasal cannula.  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  Only issue right now is nausea and vomiting.  Plan Transfer to tele today  CT to water seal  Lasix today  Cont asa and statin  Amiodarone and BB  per surgical team  Cont ssi  Mobilize Cont pulm hygiene measures  PCCM signing off but available if needed.    Best Practice (right click and "Reselect all SmartList Selections" daily)   Diet/type: cards diet DVT prophylaxis: lovenox GI prophylaxis: PPI Lines: Central line and Arterial Line Foley:  Yes, and it is still needed Code Status:  full code Last date of multidisciplinary goals of care discussion [per primary]

## 2022-12-24 NOTE — Inpatient Diabetes Management (Signed)
Inpatient Diabetes Program Recommendations  AACE/ADA: New Consensus Statement on Inpatient Glycemic Control   Target Ranges:  Prepandial:   less than 140 mg/dL      Peak postprandial:   less than 180 mg/dL (1-2 hours)      Critically ill patients:  140 - 180 mg/dL      Latest Reference Range & Units 12/16/22 09:54  Hemoglobin A1C 4.8 - 5.6 % 9.1 (H)   Review of Glycemic Control  Latest Reference Range & Units 12/23/22 07:54 12/23/22 09:25 12/23/22 10:39 12/23/22 11:19 12/23/22 16:15 12/24/22 00:32 12/24/22 03:30  Glucose-Capillary 70 - 99 mg/dL 829 (H) 93 65 (L) 89 562 (H) 146 (H) 94   Diabetes history: DM2 Outpatient Diabetes medications: Metformin XR 500 mg QHS Current orders for Inpatient glycemic control: Novolog 0-24 units q4 hours   Inpatient Diabetes Program Recommendations:    Pt with hypoglycemia on current sliding scale -   Reduce Novolog correction scale to Novolog moderate 0-15 units q4, or achs if diet ordered and eating.  Thanks,  Christena Deem RN, MSN, BC-ADM Inpatient Diabetes Coordinator Team Pager [phone] (8a-5p)

## 2022-12-24 NOTE — Progress Notes (Addendum)
Mobility Specialist Progress Note:   12/24/22 1455  Mobility  Activity Ambulated with assistance in hallway  Level of Assistance Contact guard assist, steadying assist  Assistive Device Front wheel walker  Distance Ambulated (ft) 90 ft  RUE Weight Bearing NWB  LUE Weight Bearing NWB  Activity Response Tolerated well  Mobility Referral Yes  $Mobility charge 1 Mobility  Mobility Specialist Start Time (ACUTE ONLY) 1430  Mobility Specialist Stop Time (ACUTE ONLY) 1450  Mobility Specialist Time Calculation (min) (ACUTE ONLY) 20 min   Pre Mobility: 68 HR , 98% SpO2 3 L  Post Mobility: 65 HR  100% SpO2 3 L  Pt received in bed, agreeable to mobility. C/o dizziness d/t vertigo during ambulation requiring verbal cues to stay within walker but no LOB. Pt denied any pain or SOB during session. Asymptomatic on 3 L. Pt returned to bed with call bell in reach and all needs met. Family present.    Leory Plowman  Mobility Specialist Please contact via [employer] office at [phone]

## 2022-12-24 NOTE — Discharge Summary (Signed)
Physician Discharge Summary  Patient ID: Terry White MRN: [id number] DOB/AGE: 80/14/1942 80 y.o.  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 Hypertension Heart failure with reduced ejection fraction  Discharge Diagnoses:   Acute ST elevation myocardial infarction (STEMI) of anterior wall  S/P CABG x 3 Acute respiratory failure (HCC) Acute STEMI Heart failure with reduced ejection fraction Ischemic cardiomyopathy Multivessel coronary artery disease Type 2 diabetes mellitus Hypertension Post-operative atrial fibrillation   Discharged Condition: stable    Referring Physician:  Kathleene Hazel, MD   PCP:  Terry Newcomer, PA   Follow up appts requested 9/18  History of Present Illness:     Pt is nearly an 82 yo male with no previous cardiac history who developed CP last night that recurred this am. Pt with associated nausea and SOB. PT seen in outside hospital and STEMI diagnosed and sent to [facility] cath lab where severe CAD found and only able to balloon distal LAD. Pt now CP free. No echo yet.  Was asked to evaluate for CABG. All the risks and goals of surgery along with recovery were discussed with pt and son and they understand and wish to proceed. Targets include LAD, OM and PLAT.  Hospital Course:  Terry White remained stable following left heart catheterization and coronary angioplasty.  Echocardiogram was obtained showing no significant valvular dysfunction.  Left ventricular ejection fraction was estimated 25 to 30%.  Advanced heart failure team was consulted for medical optimization prior to proceeding with surgery.  Terry White was diuresed.  He was started on digoxin.  Cardiogenic shock resolved.  He did not have any further chest pain. Terry White was taken to the operating room on 12/23/2022 where three-vessel coronary bypass grafting was  accomplished.  The left internal mammary artery was grafted to the left anterior descending coronary artery.  Separate saphenous vein grafts were placed to the obtuse marginal and posterior descending coronary arteries.  Following the procedure, he separated from cardiopulmonary bypass without difficulty.  Vital sign and hemodynamics were stable. He was weaned from the ventilator and extubated on the evening of surgery. Oral amiodarone prophylaxis was initiated on post-op day 1. He was mobilized in the ICU. He was ready for transfer to [facility] on post-op day 2.   Thse chest tubes were left in place for drainage and a small air leak. Air leak resolved and chest tubes were removed on 09/22. Prophylactic Amiodarone was decreased to 200 mg bid as HR mostly in the 60's and he was already on Lopressor 12.5 mg bid. He had mild volume overload and was diuresed accordingly. He developed urinary retention so foley was reinserted and he was started on Flomax. He has been tolerating a diet and has had a bowel movement. All wounds are clean, dry, healing without signs of infection. He initially required several liters of oxygen via [place] but was later ambulating on room air with good oxygenation. Voiding trial was done on 09/22 and he was able to void without difficulty. He developed atrial fibrillation on post-op day 5 treated with IV metoprolol resulting in conversion back to SR. He was given supplemental magnesium sulfate for magnesium level of 1.3.  the amiodarone that had been started prophylactically early post-op was continued. Jardiance was added by the cardiology team on post-op day 5 but his blood pressure was felt to be too soft to add an ACE inhibitor or ARB at  this time.  He is felt stable for discharge today.  He is tolerating a diet with no difficulty and having appropriate bowel and bladder function.  He is independent with mobility.  He has remained in a stable sinus rhythm for the past 24 hours with no  further PAF.  The heart failure team is planning follow-up and we will revisit heart failure medications at that point  Consults: pulmonary/intensive care  Significant Diagnostic Studies:   CLINICAL DATA:  Pneumothorax.  Chest pain and cough.   EXAM: CHEST - 2 VIEW   COMPARISON:  Two-view chest x-ray 12/25/2022   FINDINGS: The left-sided chest tube was removed. No significant pneumothorax is present. 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 Airspace opacities are slightly improved.   IMPRESSION: 1. Interval removal of left-sided chest tube without significant pneumothorax. 2. Slight improvement in airspace opacities.     Electronically Signed   By: Terry White M.D.   On: 12/27/2022 07:42  Treatments:  Surgery  12/22/2022 Patient:  Terry White   Pre-Op Dx: STEMI and with Coronary Artery Disease    Post-op Dx:  same   Procedure: CABG X 3 with the LIMA to the LAD and RSVG from the aorta to the PDA and from the aorta to the OM2   Endoscopic greater saphenous vein harvest on the right     Surgeon and Role:      Terry Hoes, MD- Primary    * Terry White , PA-C - assisting An experienced assistant was required given the complexity of this surgery and the standard of surgical care. The assistant was needed for exposure, dissection, suctioning, retraction of delicate tissues and sutures, instrument exchange and for overall help during this procedure.     Anesthesia  general   Blood Administration: none   Xclamp Time:  50 min   Pump Time:    Drains: Mediastinal chest tubes x 2 and a left pleural chest tube   Wires: 7   Counts: correct     Indications: 82 yo with STEMI treated with balloon angioplasty of LAD. Pt with 100% RCA and distal OM. Awaiting echo. All the risks and goals of surgery along with recovery were discussed with pt and son and they understand and wish to proceed. Targets include  LAD, OM and PLAT    Findings: The Left ventricular EF had improved from the previously reported EF of 25% to near 40%. The vessels were visible on surface of heart with the LIMA being a 1.5 mm vessel with moderate disease, the PDA was a 1.5 mm vessel with moderate disease and the distal OM was a 1.5 mm vessel with moderate disease. At conclusion of procedure the pt had EF of 40%  Discharge Exam: Blood pressure (!) 143/69, pulse 85, temperature 98.7 F (37.1 C), temperature source Oral, resp. rate 16, height 5\' 10"  (1.778 m), weight 68.3 kg, SpO2 98%.  General appearance: alert, cooperative, and no distress Neurologic: intact Heart: RRR, no arrhythmias Lungs: breath sounds coarse.  Abdomen: soft, non-tender Extremities: no peripheral edema.  Wound: Incision is intact and dry.   Disposition:  Discharged home in stable condition Discharge Instructions     Amb Referral to Cardiac Rehabilitation   Complete by: As directed    Diagnosis:  CABG STEMI PTCA     CABG X ___: 3   After initial evaluation and assessments completed: Virtual Based Care may be provided alone or in conjunction with Phase 2 Cardiac  Rehab based on patient barriers.: Yes   Intensive Cardiac Rehabilitation (ICR) [facility] location only OR Traditional Cardiac Rehabilitation (TCR) *If criteria for ICR are not met will enroll in TCR [facility] only): Yes      Allergies as of 12/28/2022   No Known Allergies      Medication List     TAKE these medications    allopurinol 300 MG tablet Commonly known as: ZYLOPRIM Take 300 mg by mouth at bedtime.   amiodarone 200 MG tablet Commonly known as: PACERONE Take 1 tablet (200 mg total) by mouth 2 (two) times daily. For one week;then take 200 mg daily thereafter   aspirin 81 MG chewable tablet Chew 1 tablet (81 mg total) by mouth daily. Start taking on: December 29, 2022   atorvastatin 80 MG tablet Commonly known as: LIPITOR Take 1 tablet (80 mg total) by mouth daily.    clopidogrel 75 MG tablet Commonly known as: PLAVIX Take 1 tablet (75 mg total) by mouth daily. Start taking on: December 29, 2022   empagliflozin 10 MG Tabs tablet Commonly known as: JARDIANCE Take 1 tablet (10 mg total) by mouth daily. Start taking on: December 29, 2022   FISH OIL PO Take 1 capsule by mouth at bedtime.   furosemide 40 MG tablet Commonly known as: LASIX Take 1 tablet (40 mg total) by mouth daily. For 5 days then stop.   metFORMIN 500 MG 24 hr tablet Commonly known as: GLUCOPHAGE-XR Take 500 mg by mouth at bedtime.   metoprolol succinate 25 MG 24 hr tablet Commonly known as: TOPROL-XL Take 1 tablet (25 mg total) by mouth daily. Start taking on: December 29, 2022   tamsulosin 0.4 MG Caps capsule Commonly known as: FLOMAX Take 1 capsule (0.4 mg total) by mouth daily.   traMADol 50 MG tablet Commonly known as: ULTRAM Take 1 tablet (50 mg total) by mouth every 6 (six) hours as needed for up to 5 days for moderate pain.        Follow-up Information     [facility] [place]. Go on 01/06/2023.   Specialty: Cardiothoracic Surgery Why: Your appointment is at 3 PM.  Please obtain a chest x-ray at [facility] located at [address] 1 hour prior to your appointment time. Contact information: [address] [place], [address] [place] [address] [phone]        Dyann Kief, [place]. Go on 01/18/2023.   Specialty: Cardiology Why: Your appointment is at 8:15 AM. Contact information: [address] [place] [address] [phone]                 The patient has been discharged on:   1.Beta Blocker:  Yes [ x  ]                              No   [   ]                              If No, reason:  2.Ace Inhibitor/ARB: Yes [   ]                                     No  [ x   ]  If No, reason: Soft blood pressure  3.Statin:    Yes [ x  ]                  No  [   ]                  If No, reason:  4.Ecasa:  Yes  [ x  ]                  No   [   ]                  If No, reason:  5. ACS on Admission?  Yes  P2Y12 Inhibitor:  Yes  [ x  ]                                No  [  ]    Signed: Jaysha Lasure G. Matilda Fleig 12/28/2022, 11:33 AM

## 2022-12-24 NOTE — Discharge Instructions (Signed)

## 2022-12-25 ENCOUNTER — Inpatient Hospital Stay (HOSPITAL_COMMUNITY): Payer: Medicare PPO

## 2022-12-25 DIAGNOSIS — I2109 ST elevation (STEMI) myocardial infarction involving other coronary artery of anterior wall: Secondary | ICD-10-CM | POA: Diagnosis not present

## 2022-12-25 LAB — CBC
HCT: 26.9 % — ABNORMAL LOW (ref 39.0–52.0)
Hemoglobin: 8.7 g/dL — ABNORMAL LOW (ref 13.0–17.0)
MCH: 31.5 pg (ref 26.0–34.0)
MCHC: 32.3 g/dL (ref 30.0–36.0)
MCV: 97.5 fL (ref 80.0–100.0)
Platelets: 156 10*3/uL (ref 150–400)
RBC: 2.76 MIL/uL — ABNORMAL LOW (ref 4.22–5.81)
RDW: 14.9 % (ref 11.5–15.5)
WBC: 12.2 10*3/uL — ABNORMAL HIGH (ref 4.0–10.5)
nRBC: 0 % (ref 0.0–0.2)

## 2022-12-25 LAB — GLUCOSE, CAPILLARY
Glucose-Capillary: 117 mg/dL — ABNORMAL HIGH (ref 70–99)
Glucose-Capillary: 90 mg/dL (ref 70–99)
Glucose-Capillary: 92 mg/dL (ref 70–99)
Glucose-Capillary: 121 mg/dL — ABNORMAL HIGH (ref 70–99)
Glucose-Capillary: 190 mg/dL — ABNORMAL HIGH (ref 70–99)
Glucose-Capillary: 206 mg/dL — ABNORMAL HIGH (ref 70–99)
Glucose-Capillary: 167 mg/dL — ABNORMAL HIGH (ref 70–99)

## 2022-12-25 LAB — BASIC METABOLIC PANEL
Anion gap: 8 (ref 5–15)
BUN: 21 mg/dL (ref 8–23)
CO2: 23 mmol/L (ref 22–32)
Calcium: 8.9 mg/dL (ref 8.9–10.3)
Chloride: 98 mmol/L (ref 98–111)
Creatinine, Ser: 1.02 mg/dL (ref 0.61–1.24)
GFR, Estimated: >60 mL/min (ref >60)
Glucose, Bld: 108 mg/dL — ABNORMAL HIGH (ref 70–99)
Potassium: 3.9 mmol/L (ref 3.5–5.1)
Sodium: 129 mmol/L — ABNORMAL LOW (ref 135–145)

## 2022-12-25 MED ORDER — POTASSIUM CHLORIDE CRYS ER 20 MEQ PO TBCR: 20.0000 meq | EXTENDED_RELEASE_TABLET | Freq: Every day | ORAL | Status: DC

## 2022-12-25 MED ORDER — AMIODARONE HCL 200 MG PO TABS
200.0000 mg | ORAL_TABLET | Freq: Two times a day (BID) | ORAL | Status: DC
  Administered 2022-12-25 – 2022-12-28 (×7): 200 mg via ORAL
  Filled 2022-12-25 (×7): qty 1

## 2022-12-25 MED ORDER — POTASSIUM CHLORIDE CRYS ER 20 MEQ PO TBCR
20.0000 meq | EXTENDED_RELEASE_TABLET | Freq: Two times a day (BID) | ORAL | Status: AC
  Administered 2022-12-25 (×2): 20 meq via ORAL
  Filled 2022-12-25 (×2): qty 1

## 2022-12-25 MED ORDER — FUROSEMIDE 40 MG PO TABS
40.0000 mg | ORAL_TABLET | Freq: Every day | ORAL | Status: DC
  Administered 2022-12-25 – 2022-12-28 (×4): 40 mg via ORAL
  Filled 2022-12-25 (×4): qty 1

## 2022-12-25 MED ORDER — TAMSULOSIN HCL 0.4 MG PO CAPS
0.4000 mg | ORAL_CAPSULE | Freq: Every day | ORAL | Status: DC
  Administered 2022-12-25 – 2022-12-28 (×4): 0.4 mg via ORAL
  Filled 2022-12-25 (×4): qty 1

## 2022-12-25 NOTE — Plan of Care (Signed)

## 2022-12-25 NOTE — Plan of Care (Signed)
Problem: Education: Goal: Knowledge of General Education information will improve Description: Including pain rating scale, medication(s)/side effects and non-pharmacologic comfort measures Outcome: Progressing   Problem: Health Behavior/Discharge Planning: Goal: Ability to manage health-related needs will improve Outcome: Progressing   Problem: Clinical Measurements: Goal: Ability to maintain clinical measurements within normal limits will improve Outcome: Progressing Goal: Will remain free from infection Outcome: Progressing Goal: Diagnostic test results will improve Outcome: Progressing Goal: Respiratory complications will improve Outcome: Progressing Goal: Cardiovascular complication will be avoided Outcome: Progressing   Problem: Activity: Goal: Risk for activity intolerance will decrease Outcome: Progressing   Problem: Nutrition: Goal: Adequate nutrition will be maintained Outcome: Progressing   Problem: Coping: Goal: Level of anxiety will decrease Outcome: Progressing   Problem: Elimination: Goal: Will not experience complications related to bowel motility Outcome: Progressing Goal: Will not experience complications related to urinary retention Outcome: Progressing   Problem: Pain Managment: Goal: General experience of comfort will improve Outcome: Progressing   Problem: Safety: Goal: Ability to remain free from injury will improve Outcome: Progressing   Problem: Skin Integrity: Goal: Risk for impaired skin integrity will decrease Outcome: Progressing   Problem: Education: Goal: Understanding of CV disease, CV risk reduction, and recovery process will improve Outcome: Progressing   Problem: Activity: Goal: Ability to return to baseline activity level will improve Outcome: Progressing   Problem: Cardiovascular: Goal: Ability to achieve and maintain adequate cardiovascular perfusion will improve Outcome: Progressing Goal: Vascular access site(s)  Level 0-1 will be maintained Outcome: Progressing   Problem: Health Behavior/Discharge Planning: Goal: Ability to safely manage health-related needs after discharge will improve Outcome: Progressing   Problem: Education: Goal: Ability to describe self-care measures that may prevent or decrease complications (Diabetes Survival Skills Education) will improve Outcome: Progressing   Problem: Coping: Goal: Ability to adjust to condition or change in health will improve Outcome: Progressing   Problem: Fluid Volume: Goal: Ability to maintain a balanced intake and output will improve Outcome: Progressing   Problem: Health Behavior/Discharge Planning: Goal: Ability to identify and utilize available resources and services will improve Outcome: Progressing Goal: Ability to manage health-related needs will improve Outcome: Progressing   Problem: Metabolic: Goal: Ability to maintain appropriate glucose levels will improve Outcome: Progressing   Problem: Nutritional: Goal: Maintenance of adequate nutrition will improve Outcome: Progressing Goal: Progress toward achieving an optimal weight will improve Outcome: Progressing   Problem: Skin Integrity: Goal: Risk for impaired skin integrity will decrease Outcome: Progressing   Problem: Tissue Perfusion: Goal: Adequacy of tissue perfusion will improve Outcome: Progressing   Problem: Education: Goal: Will demonstrate proper wound care and an understanding of methods to prevent future damage Outcome: Progressing Goal: Knowledge of disease or condition will improve Outcome: Progressing Goal: Knowledge of the prescribed therapeutic regimen will improve Outcome: Progressing Goal: Individualized Educational Video(s) Outcome: Progressing   Problem: Activity: Goal: Risk for activity intolerance will decrease Outcome: Progressing   Problem: Cardiac: Goal: Will achieve and/or maintain hemodynamic stability Outcome: Progressing    Problem: Clinical Measurements: Goal: Postoperative complications will be avoided or minimized Outcome: Progressing   Problem: Respiratory: Goal: Respiratory status will improve Outcome: Progressing   Problem: Skin Integrity: Goal: Wound healing without signs and symptoms of infection Outcome: Progressing Goal: Risk for impaired skin integrity will decrease Outcome: Progressing   Problem: Urinary Elimination: Goal: Ability to achieve and maintain adequate renal perfusion and functioning will improve Outcome: Progressing   Problem: Activity: Goal: Ability to tolerate increased activity will improve Outcome: Progressing   Problem:  Respiratory: Goal: Ability to maintain a clear airway and adequate ventilation will improve Outcome: Progressing   Problem: Role Relationship: Goal: Method of communication will improve Outcome: Progressing

## 2022-12-25 NOTE — Progress Notes (Signed)
CARDIAC REHAB PHASE I   PRE:  Rate/Rhythm: 71 SR    BP: sitting 121/62    SpO2: 97 2L, 88 RA ? accurate  MODE:  Ambulation: 470 ft   POST:  Rate/Rhythm: 92 SR    BP: sitting 149/83     SpO2: 100 2L  Pt feeling well. Moved to EOB with sternal reminders. Tried RA on EOB and pulse ox showed 88 but questionable accuracy. Stood with rocking and min assist. Ambulated with RW and 2L. Steady, some trouble steering, no c/o.  Tolerated well. To recliner. In recliner SpO2 93 RA with good pleth, left off O2 and notified RN. Pt practicing IS, 750 ml at times. Good production of phlegm. Encouraged x3 walks a day. [id number]  Ethelda Chick BS, ACSM-CEP 12/25/2022 10:33 AM

## 2022-12-25 NOTE — Progress Notes (Addendum)
      [address]       [place] [address]             [phone]        3 Days Post-Op Procedure(s) (LRB): CORONARY ARTERY BYPASS GRAFTING (CABG)XTHREE, USING LEFT INTERNAL MAMMARY ARTERY AND RIGHT LEG GREATER SAPHENEOUS VEIN HARVESTED ENDOSCOPICLY (N/A) TRANSESOPHAGEAL ECHOCARDIOGRAM (N/A)  Subjective: Patient did not talk much but would not head yes or no.  Objective: Vital signs in last 24 hours: Temp:  [97.5 F (36.4 C)-98.8 F (37.1 C)] 98.2 F (36.8 C) (09/21 0732) Pulse Rate:  [59-71] 67 (09/21 0732) Cardiac Rhythm: Normal sinus rhythm (09/20 1910) Resp:  [11-19] 12 (09/21 0732) BP: (94-130)/(56-72) 102/61 (09/21 0732) SpO2:  [87 %-100 %] 100 % (09/21 0732)  Pre op weight 69.2 kg Current Weight  12/24/22 74 kg      Intake/Output from previous day: 09/20 0701 - 09/21 0700 In: 120 [P.O.:120] Out: 970 [Urine:920; Chest Tube:50]   Physical Exam:  Cardiovascular: RRR Pulmonary: Clear to auscultation bilaterally Abdomen: Soft, non tender, bowel sounds present. Extremities: Mild bilateral lower extremity edema. Wounds: Aquacel removed and sternal wound is clean, dry, and healing without signs of infection. RLE wound is clean and dry.  No erythema or signs of infection. Chest tubes: no air leak  Lab Results: CBC: Recent Labs    12/24/22 0328 12/25/22 0332  WBC 16.4* 12.2*  HGB 9.7* 8.7*  HCT 30.2* 26.9*  PLT 206 156   BMET:  Recent Labs    12/24/22 0328 12/25/22 0332  NA 134* 129*  K 4.0 3.9  CL 95* 98  CO2 25 23  GLUCOSE 110* 108*  BUN 19 21  CREATININE 1.06 1.02  CALCIUM 9.3 8.9    PT/INR:  Lab Results  Component Value Date   INR 1.3 (H) 12/22/2022   INR 1.0 12/22/2022   INR 0.9 12/16/2022   ABG:  INR: Will add last result for INR, ABG once components are confirmed Will add last 4 CBG results once components are confirmed  Assessment/Plan:  1. CV - S/p anterior STEMI. SR. On Amiodarone 400 mg bid  (prophylaxis) and Lopressor 12.5 mg bid. HR in the 60's this am. Will decrease Amiodarone to 200 mg bid. Will discuss with Dr. Laneta Simmers if should start Plavix for ACS. 2.  Pulmonary - Chest tubes 50 cc last 24 hours. No air leak. Will remove chest tubes. On 3 liters of oxygen via [place] this am;wean as able. CXR PA/LAT this am appears stable. Encourage incentive spirometer  3. Wt still above preop: Will give Lasix 40 mg daily 4.  Expected post op acute blood loss anemia - H and H this am decreased to 8.7 and 26.9 5. Supplement potassium 6. Hyponatremia-sodium this am decreased to 129 7. DM-CBGs 125/95/90.  On Insulin PRN. Pre op HGA1C 9.3. He was Metformin XR 500 mg prior to surgery;will restart in 1-2 days. 8. On Lovenox for DVT prophylaxis 9. LOC constipation 10. GU-urinary retention. Foley in place. Flomax started. Voiding trial in am  Donielle M ZimmermanPA-C 8:08 AM   Chart reviewed, patient examined, agree with above. Chest tubes can be removed.  Foley reinserted last night for urinary retention. Will keep in today and remove tomorrow.

## 2022-12-25 NOTE — Progress Notes (Signed)
   Patient Name: Terry White Date of Encounter: 12/25/2022 [facility] Cardiologist: Verne Carrow, MD   Interval Summary  .    No new complaints  Vital Signs .    Vitals:   12/24/22 2337 12/25/22 0351 12/25/22 0403 12/25/22 0732  BP:  (!) 94/59 (!) 104/58 102/61  Pulse: 70 61 (!) 59 67  Resp:  13 13 12   Temp:  98 F (36.7 C)  98.2 F (36.8 C)  TempSrc:  Oral  Oral  SpO2:  98% 98% 100%  Weight:      Height:        Intake/Output Summary (Last 24 hours) at 12/25/2022 1015 Last data filed at 12/25/2022 0912 Gross per 24 hour  Intake 120 ml  Output 1355 ml  Net -1235 ml      12/24/2022    4:00 AM 12/23/2022    5:00 AM 12/21/2022    6:00 AM  Last 3 Weights  Weight (lbs) 163 lb 2.3 oz 162 lb 14.7 oz 152 lb 8.9 oz  Weight (kg) 74 kg 73.9 kg 69.2 kg      Telemetry/ECG    Sinus rhythm- Personally Reviewed  Physical Exam .   GEN: No acute distress.   Neck: No JVD Cardiac: RRR, no murmurs, rubs, or gallops.  Respiratory: Clear to auscultation bilaterally. GI: Soft, nontender, non-distended  MS: No edema  Assessment & Plan .     82 year old male with anterior STEMI with multivessel CAD ischemic cardiomyopathy EF 25 to 30% who underwent CABG three-vessel -Hemoglobin 8.7, stable postop, sodium 129 likely related to fluid overload continue with Lasix 40 mg daily -Prophylactic amiodarone 200 mg twice a day recently reduced.  Currently in sinus rhythm. -Aspirin 325 daily. -Balloon angioplasty only, no stenting   Intervention   -Repeat echocardiogram as outpatient in 2 to 3 months, hopeful improvement in EF.  If remains less than 35, refer for ICD. -Low-dose metoprolol currently.  Blood pressure low post CABG.  Would not tolerate ARNI/Entresto, spironolactone at this time.  For questions or updates, please contact [facility] Please consult [email or web address] for contact info under        Signed, Donato Schultz, MD

## 2022-12-26 LAB — BASIC METABOLIC PANEL
Anion gap: 11 (ref 5–15)
BUN: 22 mg/dL (ref 8–23)
CO2: 26 mmol/L (ref 22–32)
Calcium: 9.6 mg/dL (ref 8.9–10.3)
Chloride: 99 mmol/L (ref 98–111)
Creatinine, Ser: 1.02 mg/dL (ref 0.61–1.24)
GFR, Estimated: >60 mL/min (ref >60)
Glucose, Bld: 83 mg/dL (ref 70–99)
Potassium: 3.7 mmol/L (ref 3.5–5.1)
Sodium: 136 mmol/L (ref 135–145)

## 2022-12-26 LAB — GLUCOSE, CAPILLARY
Glucose-Capillary: 105 mg/dL — ABNORMAL HIGH (ref 70–99)
Glucose-Capillary: 127 mg/dL — ABNORMAL HIGH (ref 70–99)
Glucose-Capillary: 138 mg/dL — ABNORMAL HIGH (ref 70–99)
Glucose-Capillary: 151 mg/dL — ABNORMAL HIGH (ref 70–99)
Glucose-Capillary: 162 mg/dL — ABNORMAL HIGH (ref 70–99)

## 2022-12-26 MED ORDER — POTASSIUM CHLORIDE CRYS ER 20 MEQ PO TBCR
20.0000 meq | EXTENDED_RELEASE_TABLET | Freq: Every day | ORAL | Status: DC
  Administered 2022-12-27 – 2022-12-28 (×2): 20 meq via ORAL
  Filled 2022-12-26 (×2): qty 1

## 2022-12-26 MED ORDER — POTASSIUM CHLORIDE CRYS ER 20 MEQ PO TBCR
30.0000 meq | EXTENDED_RELEASE_TABLET | Freq: Two times a day (BID) | ORAL | Status: AC
  Administered 2022-12-26 (×2): 30 meq via ORAL
  Filled 2022-12-26 (×2): qty 1

## 2022-12-26 MED ORDER — METFORMIN HCL ER 500 MG PO TB24
500.0000 mg | ORAL_TABLET | Freq: Every day | ORAL | Status: DC
  Administered 2022-12-26 – 2022-12-28 (×3): 500 mg via ORAL
  Filled 2022-12-26 (×3): qty 1

## 2022-12-26 NOTE — Progress Notes (Signed)
Mobility Specialist Progress Note:    12/26/22 1345  Mobility  Activity Ambulated with assistance in hallway  Level of Assistance Contact guard assist, steadying assist  Assistive Device Front wheel walker  Distance Ambulated (ft) 250 ft  RUE Weight Bearing NWB  LUE Weight Bearing NWB  Activity Response Tolerated well  Mobility Referral Yes  $Mobility charge 1 Mobility  Mobility Specialist Start Time (ACUTE ONLY) 1200  Mobility Specialist Stop Time (ACUTE ONLY) 1213  Mobility Specialist Time Calculation (min) (ACUTE ONLY) 13 min   Received pt in bed having no complaints and agreeable to mobility.distance limited d/t fatigue, otherwise no c/o. Left in bed w/ call bell in reach and all needs met.   Pre Mobility RA SPO2 96% Post Mobility RA SPO2 93%  Thompson Grayer Mobility Specialist  Please contact vis Secure Chat or  Rehab Office [phone]

## 2022-12-26 NOTE — Progress Notes (Addendum)
      [address]       [place] [address]             [phone]        4 Days Post-Op Procedure(s) (LRB): CORONARY ARTERY BYPASS GRAFTING (CABG)XTHREE, USING LEFT INTERNAL MAMMARY ARTERY AND RIGHT LEG GREATER SAPHENEOUS VEIN HARVESTED ENDOSCOPICLY (N/A) TRANSESOPHAGEAL ECHOCARDIOGRAM (N/A)  Subjective: Patient sitting on the side of the bed eating breakfast. He is more talkative this am. He has had a small bowel movement earlier in his post operative course, but laxative did not help yesterday. He denies abdominal pain or nausea.  Objective: Vital signs in last 24 hours: Temp:  [97.5 F (36.4 C)-98.1 F (36.7 C)] 97.5 F (36.4 C) (09/22 0307) Pulse Rate:  [72-73] 73 (09/21 1519) Cardiac Rhythm: Normal sinus rhythm (09/21 1906) Resp:  [15-19] 15 (09/21 1519) BP: (102-115)/(55-61) 102/60 (09/22 0307) SpO2:  [91 %-97 %] 91 % (09/21 2330) Weight:  [72 kg] 72 kg (09/22 0307)  Pre op weight 69.2 kg Current Weight  12/26/22 72 kg      Intake/Output from previous day: 09/21 0701 - 09/22 0700 In: -  Out: 1700 [Urine:1550; Chest Tube:150]   Physical Exam:  Cardiovascular: RRR Pulmonary: Clear to auscultation bilaterally Abdomen: Soft, non tender, bowel sounds present. Extremities: Mild bilateral lower extremity edema. Wounds:Sternal wound is clean, dry, and healing without signs of infection. RLE wound is clean and dry.  No erythema or signs of infection.   Lab Results: CBC: Recent Labs    12/24/22 0328 12/25/22 0332  WBC 16.4* 12.2*  HGB 9.7* 8.7*  HCT 30.2* 26.9*  PLT 206 156   BMET:  Recent Labs    12/25/22 0332 12/26/22 0338  NA 129* 136  K 3.9 3.7  CL 98 99  CO2 23 26  GLUCOSE 108* 83  BUN 21 22  CREATININE 1.02 1.02  CALCIUM 8.9 9.6    PT/INR:  Lab Results  Component Value Date   INR 1.3 (H) 12/22/2022   INR 1.0 12/22/2022   INR 0.9 12/16/2022   ABG:  INR: Will add last result for INR, ABG once components are  confirmed Will add last 4 CBG results once components are confirmed  Assessment/Plan:  1. CV - S/p anterior STEMI. Maintaining SR. On Amiodarone 200 mg bid (prophylaxis) and Lopressor 12.5 mg bid.  ? Plavix for ACS;will defer to Dr. Leafy Ro 2.  Pulmonary -  On room air. Check PA/LAT CXR in am. Encourage incentive spirometer  3. Wt still above preop: Will give Lasix 40 mg daily 4.  Expected post op acute blood loss anemia - H and H this am decreased to 8.7 and 26.9 5. Supplement potassium 6. Hyponatremia resolved-sodium this am increased to 136 7. DM-CBGs 190/206/167.  On Insulin PRN. Pre op HGA1C 9.3. He was Metformin XR 500 mg prior to surgery;will restart  8. On Lovenox for DVT prophylaxis 9. GU-urinary retention. Foley in place. Flomax started. Voiding trial today 10. Disposition-possible discharge 1-2 days  Donielle M ZimmermanPA-C 8:05 AM    Chart reviewed, patient examined, agree with above. He continues to progress. Voiding trial today.

## 2022-12-26 NOTE — Progress Notes (Signed)
Chest tubes removed per order. VSS. Patient tolerated well.   Kenard Gower, RN

## 2022-12-27 ENCOUNTER — Inpatient Hospital Stay (HOSPITAL_COMMUNITY): Payer: Medicare PPO

## 2022-12-27 DIAGNOSIS — I2109 ST elevation (STEMI) myocardial infarction involving other coronary artery of anterior wall: Secondary | ICD-10-CM | POA: Diagnosis not present

## 2022-12-27 LAB — GLUCOSE, CAPILLARY
Glucose-Capillary: 103 mg/dL — ABNORMAL HIGH (ref 70–99)
Glucose-Capillary: 99 mg/dL (ref 70–99)
Glucose-Capillary: 76 mg/dL (ref 70–99)
Glucose-Capillary: 109 mg/dL — ABNORMAL HIGH (ref 70–99)
Glucose-Capillary: 139 mg/dL — ABNORMAL HIGH (ref 70–99)
Glucose-Capillary: 116 mg/dL — ABNORMAL HIGH (ref 70–99)
Glucose-Capillary: 117 mg/dL — ABNORMAL HIGH (ref 70–99)

## 2022-12-27 LAB — MAGNESIUM: Magnesium: 1.3 mg/dL — ABNORMAL LOW (ref 1.7–2.4)

## 2022-12-27 MED ORDER — EMPAGLIFLOZIN 10 MG PO TABS
10.0000 mg | ORAL_TABLET | Freq: Every day | ORAL | Status: DC
  Administered 2022-12-27 – 2022-12-28 (×2): 10 mg via ORAL
  Filled 2022-12-27 (×2): qty 1

## 2022-12-27 MED ORDER — CLOPIDOGREL BISULFATE 75 MG PO TABS
75.0000 mg | ORAL_TABLET | Freq: Every day | ORAL | Status: DC
  Administered 2022-12-27 – 2022-12-28 (×2): 75 mg via ORAL
  Filled 2022-12-27 (×2): qty 1

## 2022-12-27 MED ORDER — ASPIRIN 81 MG PO CHEW
81.0000 mg | CHEWABLE_TABLET | Freq: Every day | ORAL | Status: DC
  Administered 2022-12-27 – 2022-12-28 (×2): 81 mg via ORAL
  Filled 2022-12-27 (×2): qty 1

## 2022-12-27 MED ORDER — LACTULOSE 10 GM/15ML PO SOLN
20.0000 g | Freq: Once | ORAL | Status: DC
  Filled 2022-12-27: qty 30

## 2022-12-27 MED ORDER — MAGNESIUM SULFATE 2 GM/50ML IV SOLN
2.0000 g | Freq: Once | INTRAVENOUS | Status: AC
  Administered 2022-12-27: 2 g via INTRAVENOUS
  Filled 2022-12-27: qty 50

## 2022-12-27 MED ORDER — METOPROLOL SUCCINATE ER 25 MG PO TB24
25.0000 mg | ORAL_TABLET | Freq: Every day | ORAL | Status: DC
  Administered 2022-12-27 – 2022-12-28 (×2): 25 mg via ORAL
  Filled 2022-12-27 (×2): qty 1

## 2022-12-27 NOTE — Progress Notes (Addendum)
0900 patient just ambulated to bathroom and went into AFIB RVR 140s bo 153/90 5mg  lopressor given 0920 patient back in NSR HR 66 BP 114/65

## 2022-12-27 NOTE — Progress Notes (Addendum)
CARDIAC REHAB PHASE I   PRE:  Rate/Rhythm: 81 SR    BP: sitting     SpO2: 90 RA  MODE:  Ambulation: 150 ft   POST:  Rate/Rhythm: 90 SR    BP: sitting 134/68     SpO2: 89 RA  Pt to BR for BM, needing reminders for sternal precautions. Rested after BR then agreeable to short walk without RW. HHA and pt also preferred to hold to wall. Steadier with RW, which he has at home. Return to EOB, SpO2 borderline. Practiced IS, 1000 ml.   Pt admits to being irritable today, wants to be home. Gave pt encouragement and chocolate ice cream, ordered his lunch. Encouraged IS and x2 more walks. Can walk independently with RW. Continues to need sternal reminders for standing.  [id number]   Ethelda Chick BS, ACSM-CEP 12/27/2022 11:20 AM

## 2022-12-27 NOTE — TOC Progression Note (Signed)
Transition of Care [facility]) - Progression Note    Patient Details  Name: Terry White MRN: [id number] Date of Birth: 12-28-1940  Transition of [facility]) CM/SW Contact  Ronny Bacon, RN Phone Number: 12/27/2022, 3:10 PM  Clinical Narrative:  Spoke with patient at bedside. Patient has not decided on [facility] agency to use for [facility] PT/OT. Patient reports that he will talk with his son to decide on one.     Expected Discharge Plan: Home w Home Health Services Barriers to Discharge: Continued Medical Work up  Expected Discharge Plan and Services       Living arrangements for the past 2 months: Single Family Home                                       Social Determinants of Health (SDOH) Interventions SDOH Screenings   Food Insecurity: No Food Insecurity (12/16/2022)  Housing: Low Risk  (12/16/2022)  Transportation Needs: No Transportation Needs (12/16/2022)  Utilities: Not At Risk (12/16/2022)  Tobacco Use: Medium Risk (12/22/2022)    Readmission Risk Interventions     No data to display

## 2022-12-27 NOTE — Progress Notes (Addendum)
Patient Name: Terry White Date of Encounter: 12/27/2022 [facility] Cardiologist: Verne Carrow, MD   Interval Summary  .    82 yr old of male with PMH type 2 diabetes, hypertension, prior tobacco use, who presented to [facility] on 12/16/2022 for anterior STEMI and cardiogenic shock.  Heart cath revealed severe multivessel CAD with occlusion of proximal RCA and mid distal left circumflex coronary artery as well as sequential subtotal occlusion lesion in LAD.  LAD was treated with emergency balloon angioplasty but revascularization was limited by heavy calcification and inability to fully expand the angioplasty balloon.  Ultimately he underwent CABG x3 with LIMA to the LAD, RSVG from the aorta to the PDA and from the aorta to the OM2 on 12/22/2022.  He was started on amiodarone for prophylaxis of atrial fibrillation.  He was diuresed with Lasix postop.  He has suffered some acute blood loss anemia with hemoglobin 8.7 on 12/25/2022.  Hyponatremia 129 which has resolved with holding diuresis.Cardiology is following for CAD.   Patient states he is feeling well, has tolerated ambulation, not thrilled about taking many meds. He offered no further chest pain.   Vital Signs .    Vitals:   12/27/22 0405 12/27/22 0617 12/27/22 0758 12/27/22 0824  BP: (!) 124/59  133/68   Pulse: 70   80  Resp: 19     Temp: 98.1 F (36.7 C)  98.2 F (36.8 C)   TempSrc: Oral  Oral   SpO2: 91%  94%   Weight:  72.8 kg    Height:        Intake/Output Summary (Last 24 hours) at 12/27/2022 0842 Last data filed at 12/26/2022 1811 Gross per 24 hour  Intake 720 ml  Output 700 ml  Net 20 ml      12/27/2022    6:17 AM 12/26/2022    3:07 AM 12/24/2022    4:00 AM  Last 3 Weights  Weight (lbs) 160 lb 7.9 oz 158 lb 11.7 oz 163 lb 2.3 oz  Weight (kg) 72.8 kg 72 kg 74 kg      Telemetry/ECG    Sinus rhythm  - Personally Reviewed  Physical Exam .   GEN: No acute distress.   Neck: No JVD Cardiac:  RRR, no murmurs, rubs, or gallops.  Respiratory: Clear to auscultation bilaterally. On room air.  GI: Soft, nontender, non-distended  MS: No leg edema  Assessment & Plan .     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 -Has received diuresis with Lasix postoperatively, now on Lasix 40mg  daily  -Clinically he appears euvolemic, Na WNL, renal index stable  -GDMT has been limited due to low blood pressure, will transition metoprolol to Toprol XL 25 mg daily, if SBP able to be maintaining above 110, will add Entresto 24/26mg  BID, hold off additional agents given advanced age/labile BP     For questions or updates, please contact [facility] Please consult [email or web address] for contact info under  Signed, Cyndi Bender, NP    ATTENDING ATTESTATION:  After conducting a review of all available clinical information with the care team, interviewing the patient, and performing a physical exam, I agree with the findings and plan described in this note.   GEN: No acute distress.   HEENT:  MMM, no JVD, no scleral icterus Cardiac: RRR, no murmurs, rubs, or gallops.  Respiratory: Coarse at bases GI: Soft, nontender, non-distended  MS: No edema; No deformity. Neuro:  Nonfocal  Vasc:  +2 radial pulses  Patient doing well after MV CABG.  Had brief episode of AF RVR/atrial flutter around 9AM today resolved with IV lopressor.  Looks euvolemic.  Will add  Jardiance 10mg  today.  Entresto vs ARB tomorrow +/- spironolactone.  Reinforced need for medical compliance.  Alverda Skeans, MD Pager [phone]

## 2022-12-27 NOTE — Plan of Care (Signed)
Problem: Education: Goal: Knowledge of General Education information will improve Description: Including pain rating scale, medication(s)/side effects and non-pharmacologic comfort measures Outcome: Progressing   Problem: Health Behavior/Discharge Planning: Goal: Ability to manage health-related needs will improve Outcome: Progressing   Problem: Clinical Measurements: Goal: Ability to maintain clinical measurements within normal limits will improve Outcome: Progressing Goal: Will remain free from infection Outcome: Progressing Goal: Diagnostic test results will improve Outcome: Progressing Goal: Respiratory complications will improve Outcome: Progressing Goal: Cardiovascular complication will be avoided Outcome: Progressing   Problem: Activity: Goal: Risk for activity intolerance will decrease Outcome: Progressing   Problem: Nutrition: Goal: Adequate nutrition will be maintained Outcome: Progressing   Problem: Coping: Goal: Level of anxiety will decrease Outcome: Progressing   Problem: Elimination: Goal: Will not experience complications related to bowel motility Outcome: Progressing Goal: Will not experience complications related to urinary retention Outcome: Progressing   Problem: Pain Managment: Goal: General experience of comfort will improve Outcome: Progressing   Problem: Safety: Goal: Ability to remain free from injury will improve Outcome: Progressing   Problem: Skin Integrity: Goal: Risk for impaired skin integrity will decrease Outcome: Progressing   Problem: Education: Goal: Understanding of CV disease, CV risk reduction, and recovery process will improve Outcome: Progressing   Problem: Activity: Goal: Ability to return to baseline activity level will improve Outcome: Progressing   Problem: Cardiovascular: Goal: Ability to achieve and maintain adequate cardiovascular perfusion will improve Outcome: Progressing Goal: Vascular access site(s)  Level 0-1 will be maintained Outcome: Progressing   Problem: Health Behavior/Discharge Planning: Goal: Ability to safely manage health-related needs after discharge will improve Outcome: Progressing

## 2022-12-27 NOTE — Progress Notes (Signed)
Mobility Specialist Progress Note:   12/27/22 1509  Mobility  Activity Ambulated with assistance in room  Level of Assistance Standby assist, set-up cues, supervision of patient - no hands on  Assistive Device Cane  Distance Ambulated (ft) 30 ft  RUE Weight Bearing NWB  LUE Weight Bearing NWB  Activity Response Tolerated well  Mobility Referral Yes  $Mobility charge 1 Mobility  Mobility Specialist Start Time (ACUTE ONLY) 1436  Mobility Specialist Stop Time (ACUTE ONLY) 1443  Mobility Specialist Time Calculation (min) (ACUTE ONLY) 7 min   During Mobility: 83 HR   Pt received in bed, requesting to use BR. Denied hallway ambulation d/t fatigue but able to walk to BR with SB assist. Void successful. Pt returned to bed with call bell in reach and all needs met.   Leory Plowman  Mobility Specialist Please contact via [employer] office at [phone]

## 2022-12-27 NOTE — Plan of Care (Signed)
Problem: Education: Goal: Knowledge of General Education information will improve Description: Including pain rating scale, medication(s)/side effects and non-pharmacologic comfort measures Outcome: Progressing   Problem: Health Behavior/Discharge Planning: Goal: Ability to manage health-related needs will improve Outcome: Progressing   Problem: Clinical Measurements: Goal: Ability to maintain clinical measurements within normal limits will improve Outcome: Progressing Goal: Will remain free from infection Outcome: Progressing Goal: Diagnostic test results will improve Outcome: Progressing Goal: Respiratory complications will improve Outcome: Progressing Goal: Cardiovascular complication will be avoided Outcome: Progressing   Problem: Activity: Goal: Risk for activity intolerance will decrease Outcome: Progressing   Problem: Nutrition: Goal: Adequate nutrition will be maintained Outcome: Progressing   Problem: Coping: Goal: Level of anxiety will decrease Outcome: Progressing   Problem: Elimination: Goal: Will not experience complications related to bowel motility Outcome: Progressing Goal: Will not experience complications related to urinary retention Outcome: Progressing   Problem: Pain Managment: Goal: General experience of comfort will improve Outcome: Progressing   Problem: Safety: Goal: Ability to remain free from injury will improve Outcome: Progressing   Problem: Skin Integrity: Goal: Risk for impaired skin integrity will decrease Outcome: Progressing   Problem: Education: Goal: Understanding of CV disease, CV risk reduction, and recovery process will improve Outcome: Progressing   Problem: Activity: Goal: Ability to return to baseline activity level will improve Outcome: Progressing   Problem: Cardiovascular: Goal: Ability to achieve and maintain adequate cardiovascular perfusion will improve Outcome: Progressing Goal: Vascular access site(s)  Level 0-1 will be maintained Outcome: Progressing   Problem: Health Behavior/Discharge Planning: Goal: Ability to safely manage health-related needs after discharge will improve Outcome: Progressing   Problem: Education: Goal: Ability to describe self-care measures that may prevent or decrease complications (Diabetes Survival Skills Education) will improve Outcome: Progressing   Problem: Coping: Goal: Ability to adjust to condition or change in health will improve Outcome: Progressing   Problem: Fluid Volume: Goal: Ability to maintain a balanced intake and output will improve Outcome: Progressing   Problem: Health Behavior/Discharge Planning: Goal: Ability to identify and utilize available resources and services will improve Outcome: Progressing Goal: Ability to manage health-related needs will improve Outcome: Progressing   Problem: Metabolic: Goal: Ability to maintain appropriate glucose levels will improve Outcome: Progressing   Problem: Nutritional: Goal: Maintenance of adequate nutrition will improve Outcome: Progressing Goal: Progress toward achieving an optimal weight will improve Outcome: Progressing   Problem: Skin Integrity: Goal: Risk for impaired skin integrity will decrease Outcome: Progressing   Problem: Tissue Perfusion: Goal: Adequacy of tissue perfusion will improve Outcome: Progressing   Problem: Education: Goal: Will demonstrate proper wound care and an understanding of methods to prevent future damage Outcome: Progressing Goal: Knowledge of disease or condition will improve Outcome: Progressing Goal: Knowledge of the prescribed therapeutic regimen will improve Outcome: Progressing Goal: Individualized Educational Video(s) Outcome: Progressing   Problem: Activity: Goal: Risk for activity intolerance will decrease Outcome: Progressing   Problem: Cardiac: Goal: Will achieve and/or maintain hemodynamic stability Outcome: Progressing    Problem: Clinical Measurements: Goal: Postoperative complications will be avoided or minimized Outcome: Progressing   Problem: Respiratory: Goal: Respiratory status will improve Outcome: Progressing   Problem: Skin Integrity: Goal: Wound healing without signs and symptoms of infection Outcome: Progressing Goal: Risk for impaired skin integrity will decrease Outcome: Progressing   Problem: Urinary Elimination: Goal: Ability to achieve and maintain adequate renal perfusion and functioning will improve Outcome: Progressing   Problem: Activity: Goal: Ability to tolerate increased activity will improve Outcome: Progressing   Problem:  Respiratory: Goal: Ability to maintain a clear airway and adequate ventilation will improve Outcome: Progressing   Problem: Role Relationship: Goal: Method of communication will improve Outcome: Progressing

## 2022-12-27 NOTE — Progress Notes (Signed)
      [address]       [place] [address]             [phone]      5 Days Post-Op Procedure(s) (LRB): CORONARY ARTERY BYPASS GRAFTING (CABG)XTHREE, USING LEFT INTERNAL MAMMARY ARTERY AND RIGHT LEG GREATER SAPHENEOUS VEIN HARVESTED ENDOSCOPICLY (N/A) TRANSESOPHAGEAL ECHOCARDIOGRAM (N/A) Subjective: Out of bed, walking in his room.  No new issues or concerns. Wants to go home.  On RA.  Objective: Vital signs in last 24 hours: Temp:  [97.5 F (36.4 C)-98.1 F (36.7 C)] 98.1 F (36.7 C) (09/23 0405) Pulse Rate:  [69-86] 70 (09/23 0405) Cardiac Rhythm: Normal sinus rhythm (09/22 1951) Resp:  [16-22] 19 (09/23 0405) BP: (97-127)/(53-66) 124/59 (09/23 0405) SpO2:  [91 %-100 %] 91 % (09/23 0405) Weight:  [72.8 kg] 72.8 kg (09/23 0617)  Hemodynamic parameters for last 24 hours:    Intake/Output from previous day: 09/22 0701 - 09/23 0700 In: 720 [P.O.:720] Out: 700 [Urine:700] Intake/Output this shift: No intake/output data recorded.  General appearance: alert, cooperative, and no distress Neurologic: intact Heart: RRR, no arrhythmias Lungs: breath sounds coarse.  Abdomen: soft, non-tender Extremities: no peripheral edema.  Wound: Incision is intact and dry.   Lab Results: Recent Labs    12/25/22 0332  WBC 12.2*  HGB 8.7*  HCT 26.9*  PLT 156   BMET:  Recent Labs    12/25/22 0332 12/26/22 0338  NA 129* 136  K 3.9 3.7  CL 98 99  CO2 23 26  GLUCOSE 108* 83  BUN 21 22  CREATININE 1.02 1.02  CALCIUM 8.9 9.6    PT/INR: No results for input(s): "LABPROT", "INR" in the last 72 hours. ABG    Component Value Date/Time   PHART 7.304 (L) 12/22/2022 1824   HCO3 22.1 12/22/2022 1824   TCO2 23 12/22/2022 1824   ACIDBASEDEF 4.0 (H) 12/22/2022 1824   O2SAT 98 12/22/2022 1824   CBG (last 3)  Recent Labs    12/26/22 2048 12/26/22 2350 12/27/22 0402  GLUCAP 127* 105* 103*    Assessment/Plan: S/P Procedure(s) (LRB): CORONARY ARTERY  BYPASS GRAFTING (CABG)XTHREE, USING LEFT INTERNAL MAMMARY ARTERY AND RIGHT LEG GREATER SAPHENEOUS VEIN HARVESTED ENDOSCOPICLY (N/A) TRANSESOPHAGEAL ECHOCARDIOGRAM (N/A)  -POD 5 CABG x 3 after presenting with STEMI, EF 25-30% pre-op and 40% by TEE post-CABG. On ASA, atorvastatin, and low-dose metoprolol.  BP and cardiac rhythm stable.  Add Plavix and decrease the ASA to 81mg  daily.   -Expected ABL anemia.  Last Hct. 27%. Tolerating OK.   -RENAL- normal function at baseline.  He is down to pre-op Wt. Hyponatremia resolved.  Had post-op urinary retention, voiding OK since removal of the foley catheter yesterday.   -ENDO- type 2 DM- Glucose well controlled. Back on metformin. Continue CBGs and SSI.   -GI- tolerating POs but says he hasn't had a BM yet. Lactulose today.   -Disposition- planning for discharge to home with his son and brother.  Plan for tomorrow.    LOS: 11 days    Leary Roca, [place] [phone] 12/27/2022

## 2022-12-28 ENCOUNTER — Other Ambulatory Visit (HOSPITAL_COMMUNITY): Payer: Self-pay

## 2022-12-28 DIAGNOSIS — I2109 ST elevation (STEMI) myocardial infarction involving other coronary artery of anterior wall: Secondary | ICD-10-CM | POA: Diagnosis not present

## 2022-12-28 LAB — MAGNESIUM: Magnesium: 1.6 mg/dL — ABNORMAL LOW (ref 1.7–2.4)

## 2022-12-28 LAB — BASIC METABOLIC PANEL
Anion gap: 13 (ref 5–15)
BUN: 20 mg/dL (ref 8–23)
CO2: 25 mmol/L (ref 22–32)
Calcium: 9.6 mg/dL (ref 8.9–10.3)
Chloride: 100 mmol/L (ref 98–111)
Creatinine, Ser: 1.14 mg/dL (ref 0.61–1.24)
GFR, Estimated: >60 mL/min (ref >60)
Glucose, Bld: 114 mg/dL — ABNORMAL HIGH (ref 70–99)
Potassium: 3.7 mmol/L (ref 3.5–5.1)
Sodium: 138 mmol/L (ref 135–145)

## 2022-12-28 LAB — GLUCOSE, CAPILLARY
Glucose-Capillary: 107 mg/dL — ABNORMAL HIGH (ref 70–99)
Glucose-Capillary: 103 mg/dL — ABNORMAL HIGH (ref 70–99)
Glucose-Capillary: 103 mg/dL — ABNORMAL HIGH (ref 70–99)
Glucose-Capillary: 104 mg/dL — ABNORMAL HIGH (ref 70–99)
Glucose-Capillary: 108 mg/dL — ABNORMAL HIGH (ref 70–99)
Glucose-Capillary: 120 mg/dL — ABNORMAL HIGH (ref 70–99)
Glucose-Capillary: 125 mg/dL — ABNORMAL HIGH (ref 70–99)
Glucose-Capillary: 126 mg/dL — ABNORMAL HIGH (ref 70–99)
Glucose-Capillary: 128 mg/dL — ABNORMAL HIGH (ref 70–99)
Glucose-Capillary: 141 mg/dL — ABNORMAL HIGH (ref 70–99)
Glucose-Capillary: 150 mg/dL — ABNORMAL HIGH (ref 70–99)
Glucose-Capillary: 205 mg/dL — ABNORMAL HIGH (ref 70–99)
Glucose-Capillary: 93 mg/dL (ref 70–99)

## 2022-12-28 MED ORDER — EMPAGLIFLOZIN 10 MG PO TABS
10.0000 mg | ORAL_TABLET | Freq: Every day | ORAL | 2 refills | Status: DC
Start: 2022-12-29 — End: 2023-02-11
  Filled 2022-12-28: qty 30, 30d supply, fill #0

## 2022-12-28 MED ORDER — METOPROLOL SUCCINATE ER 25 MG PO TB24
25.0000 mg | ORAL_TABLET | Freq: Every day | ORAL | 2 refills | Status: DC
  Filled 2022-12-28: qty 30, 30d supply, fill #0

## 2022-12-28 MED ORDER — POTASSIUM CHLORIDE CRYS ER 20 MEQ PO TBCR
20.0000 meq | EXTENDED_RELEASE_TABLET | Freq: Every day | ORAL | 1 refills | Status: DC
  Filled 2022-12-28: qty 30, 30d supply, fill #0

## 2022-12-28 MED ORDER — FUROSEMIDE 40 MG PO TABS
40.0000 mg | ORAL_TABLET | Freq: Every day | ORAL | 1 refills | Status: DC
  Filled 2022-12-28: qty 30, 30d supply, fill #0

## 2022-12-28 MED ORDER — MAGNESIUM SULFATE 2 GM/50ML IV SOLN
2.0000 g | Freq: Once | INTRAVENOUS | Status: DC
Start: 2022-12-28 — End: 2022-12-28

## 2022-12-28 MED ORDER — ATORVASTATIN CALCIUM 80 MG PO TABS
80.0000 mg | ORAL_TABLET | Freq: Every day | ORAL | 1 refills | Status: DC
  Filled 2022-12-28: qty 30, 30d supply, fill #0

## 2022-12-28 MED ORDER — ASPIRIN 81 MG PO CHEW
81.0000 mg | CHEWABLE_TABLET | Freq: Every day | ORAL | 2 refills | Status: DC
  Filled 2022-12-28: qty 90, 90d supply, fill #0

## 2022-12-28 MED ORDER — POTASSIUM CHLORIDE CRYS ER 20 MEQ PO TBCR
20.0000 meq | EXTENDED_RELEASE_TABLET | Freq: Once | ORAL | Status: AC
  Administered 2022-12-28: 20 meq via ORAL
  Filled 2022-12-28: qty 1

## 2022-12-28 MED ORDER — TRAMADOL HCL 50 MG PO TABS
50.0000 mg | ORAL_TABLET | Freq: Four times a day (QID) | ORAL | 0 refills | Status: AC | PRN
  Filled 2022-12-28: qty 20, 5d supply, fill #0

## 2022-12-28 MED ORDER — AMIODARONE HCL 200 MG PO TABS
200.0000 mg | ORAL_TABLET | Freq: Two times a day (BID) | ORAL | 2 refills | Status: DC
  Filled 2022-12-28: qty 40, 33d supply, fill #0

## 2022-12-28 MED ORDER — TAMSULOSIN HCL 0.4 MG PO CAPS
0.4000 mg | ORAL_CAPSULE | Freq: Every day | ORAL | 0 refills | Status: DC
  Filled 2022-12-28: qty 30, 30d supply, fill #0

## 2022-12-28 MED ORDER — CLOPIDOGREL BISULFATE 75 MG PO TABS
75.0000 mg | ORAL_TABLET | Freq: Every day | ORAL | 11 refills | Status: DC
  Filled 2022-12-28: qty 30, 30d supply, fill #0

## 2022-12-28 MED ORDER — MAGNESIUM SULFATE 4 GM/100ML IV SOLN
4.0000 g | Freq: Once | INTRAVENOUS | Status: AC
  Administered 2022-12-28: 4 g via INTRAVENOUS
  Filled 2022-12-28: qty 100

## 2022-12-28 NOTE — Progress Notes (Addendum)
      [address]       [place] [address]             [phone]      6 Days Post-Op Procedure(s) (LRB): CORONARY ARTERY BYPASS GRAFTING (CABG)XTHREE, USING LEFT INTERNAL MAMMARY ARTERY AND RIGHT LEG GREATER SAPHENEOUS VEIN HARVESTED ENDOSCOPICLY (N/A) TRANSESOPHAGEAL ECHOCARDIOGRAM (N/A) Subjective: Slept much better last night and says he is feeling much better today. Calm and  cooperative. No new concerns.   Objective: Vital signs in last 24 hours: Temp:  [97.5 F (36.4 C)-98.2 F (36.8 C)] 98 F (36.7 C) (09/24 0430) Pulse Rate:  [66-80] 70 (09/24 0430) Cardiac Rhythm: Normal sinus rhythm (09/23 2042) Resp:  [16-18] 16 (09/24 0430) BP: (101-133)/(55-68) 109/59 (09/24 0430) SpO2:  [94 %-98 %] 98 % (09/23 2259) Weight:  [68.3 kg] 68.3 kg (09/24 0430)    Intake/Output from previous day: 09/23 0701 - 09/24 0700 In: 360 [P.O.:360] Out: -  Intake/Output this shift: No intake/output data recorded.  General appearance: alert, cooperative, and no distress Neurologic: intact Heart: RRR, no arrhythmias Lungs: breath sounds coarse.  Abdomen: soft, non-tender Extremities: no peripheral edema.  Wound: Incision is intact and dry.   Lab Results: No results for input(s): "WBC", "HGB", "HCT", "PLT" in the last 72 hours.  BMET:  Recent Labs    12/26/22 0338 12/28/22 0247  NA 136 138  K 3.7 3.7  CL 99 100  CO2 26 25  GLUCOSE 83 114*  BUN 22 20  CREATININE 1.02 1.14  CALCIUM 9.6 9.6    PT/INR: No results for input(s): "LABPROT", "INR" in the last 72 hours. ABG    Component Value Date/Time   PHART 7.304 (L) 12/22/2022 1824   HCO3 22.1 12/22/2022 1824   TCO2 23 12/22/2022 1824   ACIDBASEDEF 4.0 (H) 12/22/2022 1824   O2SAT 98 12/22/2022 1824   CBG (last 3)  Recent Labs    12/27/22 2015 12/27/22 2306 12/28/22 0430  GLUCAP 116* 117* 107*    Assessment/Plan: S/P Procedure(s) (LRB): CORONARY ARTERY BYPASS GRAFTING (CABG)XTHREE, USING LEFT  INTERNAL MAMMARY ARTERY AND RIGHT LEG GREATER SAPHENEOUS VEIN HARVESTED ENDOSCOPICLY (N/A) TRANSESOPHAGEAL ECHOCARDIOGRAM (N/A)  -POD 6 CABG x 3 after presenting with STEMI, EF 25-30% pre-op and 40% by TEE post-CABG. On ASA, Plavix, atorvastatin, and low-dose metoprolol.  BP stable.   -Post-op A-fib- brief episode yesterday morning converted back to SR with IV metoprolol. He was already partially loaded with amiodarone started early post-op.  Will continue.  Correct K+ and Mg++.   -Expected ABL anemia.  Last Hct. 27%. Tolerating OK.   -RENAL- normal function at baseline.  He is down to pre-op Wt. Hyponatremia resolved.  Had post-op urinary retention, voiding OK now.   -ENDO- type 2 DM- Glucose well controlled. Back on metformin. Continue CBGs and SSI.   -GI- tolerating POs but still with poor appetite.  BM's yesterday.   -Disposition- planning for discharge to home with his son and brother with [facility] PT/OT.   Could be discharged today from CTS standpoint but will await opinion from cardiology as HF medications are being adjusted.    LOS: 12 days    Leary Roca, [place] [phone] 12/28/2022  Agree with above. Ok to discharge from surgical standpoint

## 2022-12-28 NOTE — Progress Notes (Addendum)
Patient Name: Terry White Date of Encounter: 12/28/2022 [facility] Cardiologist: Verne Carrow, MD   Interval Summary  .    82 yr old of male with PMH type 2 diabetes, hypertension, prior tobacco use, who presented to [facility] on 12/16/2022 for anterior STEMI and cardiogenic shock.  Heart cath revealed severe multivessel CAD with occlusion of proximal RCA and mid distal left circumflex coronary artery as well as sequential subtotal occlusion lesion in LAD.  LAD was treated with emergency balloon angioplasty but revascularization was limited by heavy calcification and inability to fully expand the angioplasty balloon.  Ultimately he underwent CABG x3 with LIMA to the LAD, RSVG from the aorta to the PDA and from the aorta to the OM2 on 12/22/2022.  He was started on amiodarone for prophylaxis of atrial fibrillation.  He was diuresed with Lasix postop.  He has suffered some acute blood loss anemia with hemoglobin 8.7 on 12/25/2022.  Hyponatremia 129 which has resolved with holding diuresis.Cardiology is following for CAD.   Doing well post op, hopeful for DC today.   Vital Signs .    Vitals:   12/27/22 1951 12/27/22 2259 12/28/22 0430 12/28/22 0728  BP: 121/66 127/60 (!) 109/59 (!) 143/69  Pulse: 74  70 85  Resp:   16   Temp: 97.9 F (36.6 C) (!) 97.5 F (36.4 C) 98 F (36.7 C) 98.7 F (37.1 C)  TempSrc: Oral Oral Oral Oral  SpO2: 97% 98%  98%  Weight:   68.3 kg   Height:        Intake/Output Summary (Last 24 hours) at 12/28/2022 1033 Last data filed at 12/27/2022 1254 Gross per 24 hour  Intake 240 ml  Output --  Net 240 ml      12/28/2022    4:30 AM 12/27/2022    6:17 AM 12/26/2022    3:07 AM  Last 3 Weights  Weight (lbs) 150 lb 9.2 oz 160 lb 7.9 oz 158 lb 11.7 oz  Weight (kg) 68.3 kg 72.8 kg 72 kg      Telemetry/ECG    Sinus Rhythm, rates 70s - Personally Reviewed  Physical Exam .   GEN: No acute distress.   Neck: No JVD Cardiac: RRR, no murmurs,  rubs, or gallops.  Respiratory: Clear to auscultation bilaterally. GI: Soft, nontender, non-distended  MS: No edema  Assessment & Plan .     Anterior STEMI -- s/p multivessel CABG, doing well post op -- treated with DAPT, ASA/plavix given presentation -- continue Liptor, Toprol XL  HFrEF ICM -- Echocardiogram from 12/16/2022 with LVEF 25 to 30%, + regional wall motion abnormality, no LV thrombus, mild concentric LVH, grade 1 DD, normal RV, normal PASP 21.5 mmHg, mild MR, aortic sclerosis. -- Has received diuresis with Lasix postoperatively, now on Lasix 40mg  daily  -- Clinically he appears euvolemic, Na WNL, renal index stable  -- GDMT has been limited due to low blood pressure, tolerating Toprol XL 25mg  daily and farxiga. Will hold on additional therapy with age and soft BP. Can add therapy with Entresto vs ARB, +/- spiro at follow up visit    Follow up appt has been arranged.   For questions or updates, please contact  [facility] Please consult [email or web address] for contact info under        Signed, Laverda Page, NP   Patient seen, examined. Available data reviewed. Agree with findings, assessment, and plan as outlined by Laverda Page, NP.  Is a 82 year old gentleman has done  well with emergency CABG in the setting of severe LV dysfunction.  He continues on DAPT with aspirin and clopidogrel given his presentation with anterior STEMI.  He remains on atorvastatin and metoprolol succinate.  I went back and reviewed his blood pressure and vital signs over the last 48 hours.  He continues to have systolic readings intermittently in the low 100s and in the 90s.  I agree with holding off on an ARB/ARNI or spironolactone until he is further recovered and seen in the outpatient setting.  I would recommend discharging him on his current medicines which include metoprolol succinate and Farxiga as well as oral furosemide.  We will arrange outpatient cardiology follow-up.  Tonny Bollman, M.D. 12/28/2022 11:11 AM

## 2022-12-28 NOTE — Plan of Care (Signed)

## 2022-12-28 NOTE — Progress Notes (Signed)
Pt received OHS book and education on restrictions, heart healthy diet, ex guidelines, Move in the Tube sheet, incentive spirometer use when d/c and CRPII. Pt denies questions and was encouraged to look in the book for additional information. Referral placed to AP.   Terry White 12/28/2022 11:48 AM

## 2022-12-28 NOTE — TOC Benefit Eligibility Note (Signed)
Patient Product/process development scientist completed.    The patient is insured through [employer]. Patient has Medicare and is not eligible for a copay card, but may be able to apply for patient assistance, if available.    Ran test claim for Entresto 24-26 mg and the current 30 day co-pay is $11.20.  Ran test claim for Farxiga 10 mg and the current 30 day co-pay is $11.20.  Ran test claim for Jardiance 10 mg and the current 30 day co-pay is $11.20.   This test claim was processed through [facility]- copay amounts may vary at other pharmacies due to pharmacy/plan contracts, or as the patient moves through the different stages of their insurance plan.     Roland Earl, CPHT Pharmacy Technician III Certified Patient Advocate [facility] Patient Advocate Team Direct Number: [phone]  Fax: [phone]

## 2022-12-28 NOTE — Progress Notes (Signed)
Discharge instructions reviewed with pt and his son.  Copy of instructions given to pt/son. [facility] has filled scripts for pt and will be picked up on the way out to discharge at main entrance.  Pt to be d/c'd via wheelchair with belongings, with son.           To be escorted by staff/volunteer.   Thuy Atilano,RN SWOT

## 2022-12-28 NOTE — TOC Transition Note (Addendum)
Transition of Care [facility]) - CM/SW Discharge Note   Patient Details  Name: Terry White MRN: [id number] Date of Birth: 1940-11-19  Transition of Care [facility]) CM/SW Contact:  Lawerance Sabal, RN Phone Number: 12/28/2022, 11:11 AM   Clinical Narrative:     Spoke w patient at bedside to discuss [facility] needs.  He has his walking stick at bedside, preferes to use this for ambulatory assist, no DME needs identified.  He is agreeable to [facility] services, we discussed providers and [facility] is able to accept referral.  He states his one son is a driver and lives with him, but is at home alone sometimes. He has another son who lives about 4 minutes away from him who is also supportive.  He will have a son pick him up when it is time to DC.      Final next level of care: Home w Home Health Services Barriers to Discharge: No Barriers Identified   Patient Goals and CMS Choice CMS Medicare.gov Compare Post Acute Care list provided to:: Patient Choice offered to / list presented to : Patient  Discharge Placement                         Discharge Plan and Services Additional resources added to the After Visit Summary for                  DME Arranged: N/A         HH Arranged: PT, OT HH Agency: [facility] Date [facility] Agency Contacted: 12/28/22 Time HH Agency Contacted: 1111 Representative spoke with at [facility] Agency: Haywood Lasso  Social Determinants of Health (SDOH) Interventions SDOH Screenings   Food Insecurity: No Food Insecurity (12/16/2022)  Housing: Low Risk  (12/16/2022)  Transportation Needs: No Transportation Needs (12/16/2022)  Utilities: Not At Risk (12/16/2022)  Tobacco Use: Medium Risk (12/22/2022)     Readmission Risk Interventions     No data to display

## 2022-12-28 NOTE — Progress Notes (Signed)
Mobility Specialist Progress Note:   12/28/22 1146  Mobility  Activity Ambulated with assistance in room  Level of Assistance Modified independent, requires aide device or extra time  Assistive Device Cane  Distance Ambulated (ft) 30 ft  RUE Weight Bearing NWB  LUE Weight Bearing NWB  Activity Response Tolerated well  Mobility Referral Yes  $Mobility charge 1 Mobility  Mobility Specialist Start Time (ACUTE ONLY) 0941  Mobility Specialist Stop Time (ACUTE ONLY) 0946  Mobility Specialist Time Calculation (min) (ACUTE ONLY) 5 min   Post Mobility: 81 HR  Pt received in bed, denied ambulation in hallway but requesting assist to BR. x1 LOB when transitioning to EOB d/t dizziness requiring CG for correction. Pt able to ambulate to/from BR safely and left in bed with call bell in reach and all needs met.   Leory Plowman  Mobility Specialist Please contact via [employer] office at [phone]

## 2022-12-29 MED FILL — Sodium Bicarbonate IV Soln 8.4%: INTRAVENOUS | Qty: 50 | Status: AC

## 2022-12-29 MED FILL — Lidocaine HCl Local Soln Prefilled Syringe 100 MG/5ML (2%): INTRAMUSCULAR | Qty: 5 | Status: AC

## 2022-12-29 MED FILL — Electrolyte-R (PH 7.4) Solution: INTRAVENOUS | Qty: 6000 | Status: AC

## 2022-12-29 MED FILL — Sodium Chloride IV Soln 0.9%: INTRAVENOUS | Qty: 2000 | Status: AC

## 2022-12-29 MED FILL — Heparin Sodium (Porcine) Inj 1000 Unit/ML: INTRAMUSCULAR | Qty: 30 | Status: AC

## 2022-12-29 MED FILL — Mannitol IV Soln 20%: INTRAVENOUS | Qty: 500 | Status: AC

## 2022-12-30 ENCOUNTER — Ambulatory Visit: Payer: Medicare PPO

## 2023-01-03 DIAGNOSIS — I2510 Atherosclerotic heart disease of native coronary artery without angina pectoris: Secondary | ICD-10-CM | POA: Diagnosis not present

## 2023-01-03 DIAGNOSIS — I951 Orthostatic hypotension: Secondary | ICD-10-CM | POA: Diagnosis not present

## 2023-01-03 DIAGNOSIS — I2109 ST elevation (STEMI) myocardial infarction involving other coronary artery of anterior wall: Secondary | ICD-10-CM | POA: Diagnosis not present

## 2023-01-03 DIAGNOSIS — I4891 Unspecified atrial fibrillation: Secondary | ICD-10-CM | POA: Diagnosis not present

## 2023-01-03 DIAGNOSIS — I110 Hypertensive heart disease with heart failure: Secondary | ICD-10-CM | POA: Diagnosis not present

## 2023-01-03 DIAGNOSIS — Z48812 Encounter for surgical aftercare following surgery on the circulatory system: Secondary | ICD-10-CM | POA: Diagnosis not present

## 2023-01-03 DIAGNOSIS — I5022 Chronic systolic (congestive) heart failure: Secondary | ICD-10-CM | POA: Diagnosis not present

## 2023-01-03 DIAGNOSIS — E119 Type 2 diabetes mellitus without complications: Secondary | ICD-10-CM | POA: Diagnosis not present

## 2023-01-03 DIAGNOSIS — I255 Ischemic cardiomyopathy: Secondary | ICD-10-CM | POA: Diagnosis not present

## 2023-01-05 ENCOUNTER — Other Ambulatory Visit: Payer: Self-pay | Admitting: Thoracic Surgery (Cardiothoracic Vascular Surgery)

## 2023-01-05 DIAGNOSIS — Z951 Presence of aortocoronary bypass graft: Secondary | ICD-10-CM

## 2023-01-06 ENCOUNTER — Ambulatory Visit
Admission: RE | Admit: 2023-01-06 | Discharge: 2023-01-06 | Disposition: A | Discharge status: Home / Self Care | Payer: Medicare PPO | Source: Ambulatory Visit | Attending: Thoracic Surgery (Cardiothoracic Vascular Surgery) | Admitting: Thoracic Surgery (Cardiothoracic Vascular Surgery)

## 2023-01-06 ENCOUNTER — Ambulatory Visit (INDEPENDENT_AMBULATORY_CARE_PROVIDER_SITE_OTHER): Payer: Self-pay | Admitting: Physician Assistant

## 2023-01-06 VITALS — BP 113/57 | HR 79 | Resp 20 | Ht 69.5 in | Wt 148.0 lb

## 2023-01-06 DIAGNOSIS — Z951 Presence of aortocoronary bypass graft: Secondary | ICD-10-CM

## 2023-01-06 NOTE — Patient Instructions (Signed)
Hold the Lasix tomorrow then resume on Saturday at half tablet (20 mg) daily  Decreased the potassium chloride this is (K-Chlor-con) to 20 mEq every other day.  Decrease the amiodarone to 200 mg once daily.  You may gradually increase activity but continue to observe sternal precautions with no lifting greater than 10 pounds.  Follow-up with Dr. Leafy Ro in 3 to 4 weeks.

## 2023-01-06 NOTE — Progress Notes (Signed)
HPI: Mr. Terry White is an-year-old gentleman with a past history of type 2 diabetes mellitus and hypertension.  He was recently referred to [place] after being admitted with an ST elevation myocardial infarction for management of his multivessel coronary artery disease.  He underwent CABG x 3 by Dr. Leafy Ro on 12/23/2022 with a left internal mammary artery grafted to the left anterior descending coronary artery.  Separate saphenous vein grafts were placed to the obtuse marginal and posterior descending coronary arteries.  Postoperative course was notable for atrial fibrillation that was managed with amiodarone by mouth and metoprolol.  He was discharged in stable sinus rhythm on postop day 5.  He presents today for scheduled follow-up. Since discharge, Terry White reports having frequent dizziness when he goes from a sitting to standing position.  He said usually resolves within a few minutes after being up on his feet.  He said he has had very little pain sternal incision.  He has been taking medications as prescribed.  Current Outpatient Medications  Medication Sig Dispense Refill   allopurinol (ZYLOPRIM) 300 MG tablet Take 300 mg by mouth at bedtime.     amiodarone (PACERONE) 200 MG tablet Take 1 tablet (200mg ) by mouth 2 (two) times daily for one week;  then take 1 tablet (200mg ) once daily thereafter 40 tablet 2   aspirin 81 MG chewable tablet Chew 1 tablet (81 mg total) by mouth daily. 100 tablet 2   atorvastatin (LIPITOR) 80 MG tablet Take 1 tablet (80 mg total) by mouth daily. 30 tablet 1   clopidogrel (PLAVIX) 75 MG tablet Take 1 tablet (75 mg total) by mouth daily. 30 tablet 11   empagliflozin (JARDIANCE) 10 MG TABS tablet Take 1 tablet (10 mg total) by mouth daily. 30 tablet 2   furosemide (LASIX) 40 MG tablet Take 1 tablet (40 mg total) by mouth daily. 30 tablet 1   metFORMIN (GLUCOPHAGE-XR) 500 MG 24 hr tablet Take 500 mg by mouth at bedtime.     metoprolol succinate (TOPROL-XL) 25 MG 24  hr tablet Take 1 tablet (25 mg total) by mouth daily. 30 tablet 2   Omega-3 Fatty Acids (FISH OIL PO) Take 1 capsule by mouth at bedtime.     potassium chloride SA (KLOR-CON M) 20 MEQ tablet Take 1 tablet (20 mEq total) by mouth daily. 30 tablet 1   tamsulosin (FLOMAX) 0.4 MG CAPS capsule Take 1 capsule (0.4 mg total) by mouth daily. 30 capsule 0   No current facility-administered medications for this visit.    Physical Exam: Vital signs BP 113/57 Heart rate 79 Respirations 20 SpO2 94% on room air Weight 148 pounds (about 5 pounds below his preoperative weight)  General: Terry White is in no distress.  Skin is warm and dry.  He is alert and oriented x 4 and is in good spirits today. Heart: Regular rate and rhythm, no murmur. Chest: Breath sounds are full, equal, and clear to auscultation.  Sternotomy incision is healing with no sign of complication.  Sutures from the chest tube sites were removed.  The chest x-ray has not been read the images were reviewed.  The lung fields have continued to clear since his discharge home.  There are no significant pleural effusions.  The heart is normal size and has normal contours.  Sternal wires are properly aligned.   Extremities: No edema  Diagnostic Tests: Chest x-ray today: Radiologist report is pending  Impression / Plan: Terry White is making a progressive and satisfactory recovery  from CABG x 3 after presenting with multivessel coronary disease and ischemic cardiomyopathy.  His primary complaint is dizziness.  I think he is likely volume contracted.  I asked him to hold his Lasix tomorrow and then resume on the following day at 20 mg once a day instead of the 40 mg.  Will also reduce to KCl to 20 mEq every other day.  He has had no further palpitations and is in a regular rhythm today.  I think it is safe to decrease the amiodarone to 200 mg once daily. Continue all other medications as prescribed He may gradually increase your activity but  continue to observe sternal precautions as instructed in the hospital with no lifting greater than 10 pounds. Follow-up with Dr. Leafy Ro in 3 to 4 weeks   Leary Roca, PA-C [facility] [phone]

## 2023-01-07 DIAGNOSIS — I2109 ST elevation (STEMI) myocardial infarction involving other coronary artery of anterior wall: Secondary | ICD-10-CM | POA: Diagnosis not present

## 2023-01-07 DIAGNOSIS — Z48812 Encounter for surgical aftercare following surgery on the circulatory system: Secondary | ICD-10-CM | POA: Diagnosis not present

## 2023-01-07 DIAGNOSIS — I4891 Unspecified atrial fibrillation: Secondary | ICD-10-CM | POA: Diagnosis not present

## 2023-01-07 DIAGNOSIS — I2510 Atherosclerotic heart disease of native coronary artery without angina pectoris: Secondary | ICD-10-CM | POA: Diagnosis not present

## 2023-01-07 DIAGNOSIS — I255 Ischemic cardiomyopathy: Secondary | ICD-10-CM | POA: Diagnosis not present

## 2023-01-07 DIAGNOSIS — I951 Orthostatic hypotension: Secondary | ICD-10-CM | POA: Diagnosis not present

## 2023-01-07 DIAGNOSIS — E119 Type 2 diabetes mellitus without complications: Secondary | ICD-10-CM | POA: Diagnosis not present

## 2023-01-07 DIAGNOSIS — I5022 Chronic systolic (congestive) heart failure: Secondary | ICD-10-CM | POA: Diagnosis not present

## 2023-01-07 DIAGNOSIS — I110 Hypertensive heart disease with heart failure: Secondary | ICD-10-CM | POA: Diagnosis not present

## 2023-01-10 DIAGNOSIS — I951 Orthostatic hypotension: Secondary | ICD-10-CM | POA: Diagnosis not present

## 2023-01-10 DIAGNOSIS — E119 Type 2 diabetes mellitus without complications: Secondary | ICD-10-CM | POA: Diagnosis not present

## 2023-01-10 DIAGNOSIS — I5022 Chronic systolic (congestive) heart failure: Secondary | ICD-10-CM | POA: Diagnosis not present

## 2023-01-10 DIAGNOSIS — Z48812 Encounter for surgical aftercare following surgery on the circulatory system: Secondary | ICD-10-CM | POA: Diagnosis not present

## 2023-01-10 DIAGNOSIS — I110 Hypertensive heart disease with heart failure: Secondary | ICD-10-CM | POA: Diagnosis not present

## 2023-01-10 DIAGNOSIS — I2109 ST elevation (STEMI) myocardial infarction involving other coronary artery of anterior wall: Secondary | ICD-10-CM | POA: Diagnosis not present

## 2023-01-10 DIAGNOSIS — I255 Ischemic cardiomyopathy: Secondary | ICD-10-CM | POA: Diagnosis not present

## 2023-01-10 DIAGNOSIS — I2510 Atherosclerotic heart disease of native coronary artery without angina pectoris: Secondary | ICD-10-CM | POA: Diagnosis not present

## 2023-01-10 DIAGNOSIS — I4891 Unspecified atrial fibrillation: Secondary | ICD-10-CM | POA: Diagnosis not present

## 2023-01-12 DIAGNOSIS — I255 Ischemic cardiomyopathy: Secondary | ICD-10-CM | POA: Diagnosis not present

## 2023-01-12 DIAGNOSIS — I5022 Chronic systolic (congestive) heart failure: Secondary | ICD-10-CM | POA: Diagnosis not present

## 2023-01-12 DIAGNOSIS — I951 Orthostatic hypotension: Secondary | ICD-10-CM | POA: Diagnosis not present

## 2023-01-12 DIAGNOSIS — I4891 Unspecified atrial fibrillation: Secondary | ICD-10-CM | POA: Diagnosis not present

## 2023-01-12 DIAGNOSIS — I2109 ST elevation (STEMI) myocardial infarction involving other coronary artery of anterior wall: Secondary | ICD-10-CM | POA: Diagnosis not present

## 2023-01-12 DIAGNOSIS — Z48812 Encounter for surgical aftercare following surgery on the circulatory system: Secondary | ICD-10-CM | POA: Diagnosis not present

## 2023-01-12 DIAGNOSIS — I2510 Atherosclerotic heart disease of native coronary artery without angina pectoris: Secondary | ICD-10-CM | POA: Diagnosis not present

## 2023-01-12 DIAGNOSIS — I110 Hypertensive heart disease with heart failure: Secondary | ICD-10-CM | POA: Diagnosis not present

## 2023-01-12 DIAGNOSIS — E119 Type 2 diabetes mellitus without complications: Secondary | ICD-10-CM | POA: Diagnosis not present

## 2023-01-17 DIAGNOSIS — I2109 ST elevation (STEMI) myocardial infarction involving other coronary artery of anterior wall: Secondary | ICD-10-CM | POA: Diagnosis not present

## 2023-01-17 DIAGNOSIS — I4891 Unspecified atrial fibrillation: Secondary | ICD-10-CM | POA: Diagnosis not present

## 2023-01-17 DIAGNOSIS — Z48812 Encounter for surgical aftercare following surgery on the circulatory system: Secondary | ICD-10-CM | POA: Diagnosis not present

## 2023-01-17 DIAGNOSIS — I2510 Atherosclerotic heart disease of native coronary artery without angina pectoris: Secondary | ICD-10-CM | POA: Diagnosis not present

## 2023-01-17 DIAGNOSIS — I110 Hypertensive heart disease with heart failure: Secondary | ICD-10-CM | POA: Diagnosis not present

## 2023-01-17 DIAGNOSIS — I951 Orthostatic hypotension: Secondary | ICD-10-CM | POA: Diagnosis not present

## 2023-01-17 DIAGNOSIS — E119 Type 2 diabetes mellitus without complications: Secondary | ICD-10-CM | POA: Diagnosis not present

## 2023-01-17 DIAGNOSIS — I5022 Chronic systolic (congestive) heart failure: Secondary | ICD-10-CM | POA: Diagnosis not present

## 2023-01-17 DIAGNOSIS — I255 Ischemic cardiomyopathy: Secondary | ICD-10-CM | POA: Diagnosis not present

## 2023-01-18 ENCOUNTER — Ambulatory Visit: Payer: Medicare PPO | Admitting: Physician Assistant

## 2023-01-24 DIAGNOSIS — E119 Type 2 diabetes mellitus without complications: Secondary | ICD-10-CM | POA: Diagnosis not present

## 2023-01-24 DIAGNOSIS — I2109 ST elevation (STEMI) myocardial infarction involving other coronary artery of anterior wall: Secondary | ICD-10-CM | POA: Diagnosis not present

## 2023-01-24 DIAGNOSIS — I5022 Chronic systolic (congestive) heart failure: Secondary | ICD-10-CM | POA: Diagnosis not present

## 2023-01-24 DIAGNOSIS — I255 Ischemic cardiomyopathy: Secondary | ICD-10-CM | POA: Diagnosis not present

## 2023-01-24 DIAGNOSIS — I2510 Atherosclerotic heart disease of native coronary artery without angina pectoris: Secondary | ICD-10-CM | POA: Diagnosis not present

## 2023-01-24 DIAGNOSIS — I110 Hypertensive heart disease with heart failure: Secondary | ICD-10-CM | POA: Diagnosis not present

## 2023-01-24 DIAGNOSIS — I4891 Unspecified atrial fibrillation: Secondary | ICD-10-CM | POA: Diagnosis not present

## 2023-01-24 DIAGNOSIS — I951 Orthostatic hypotension: Secondary | ICD-10-CM | POA: Diagnosis not present

## 2023-01-24 DIAGNOSIS — Z48812 Encounter for surgical aftercare following surgery on the circulatory system: Secondary | ICD-10-CM | POA: Diagnosis not present

## 2023-01-26 NOTE — Progress Notes (Unsigned)
[address]       [place] [address]             [phone]           Terry White [facility] Medical Record #[id number] Date of Birth: Jun 25, 1940  Terry White, [name]  Chief Complaint:   follow up CABG  History of Present Illness:     Pt is about 3 weeks out from CABx3. He says he is doing well. He has complained of dizziness with standing or after walking but this also was an issue preop. He uses cane secondary to this symptom. He has no chest pain. He has not heard from cardiac rehab      Past Medical History:  Diagnosis Date   Diabetes mellitus without complication (HCC)    Gout    HTN (hypertension)     Past Surgical History:  Procedure Laterality Date   CORONARY ARTERY BYPASS GRAFT N/A 12/22/2022   Procedure: CORONARY ARTERY BYPASS GRAFTING (CABG)XTHREE, USING LEFT INTERNAL MAMMARY ARTERY AND RIGHT LEG GREATER SAPHENEOUS VEIN HARVESTED ENDOSCOPICLY;  Surgeon: Eugenio Hoes, MD;  Location: [facility] OR;  Service: Open Heart Surgery;  Laterality: N/A;   CORONARY/GRAFT ACUTE MI REVASCULARIZATION N/A 12/16/2022   Procedure: Coronary/Graft Acute MI Revascularization;  Surgeon: Kathleene Hazel, MD;  Location: [facility] INVASIVE CV LAB;  Service: Cardiovascular;  Laterality: N/A;   LEFT HEART CATH AND CORONARY ANGIOGRAPHY N/A 12/16/2022   Procedure: LEFT HEART CATH AND CORONARY ANGIOGRAPHY;  Surgeon: Kathleene Hazel, MD;  Location: [facility] INVASIVE CV LAB;  Service: Cardiovascular;  Laterality: N/A;   TEE WITHOUT CARDIOVERSION N/A 12/22/2022   Procedure: TRANSESOPHAGEAL ECHOCARDIOGRAM;  Surgeon: Eugenio Hoes, MD;  Location[facility];  Service: Open Heart Surgery;  Laterality: N/A;    Social History   Tobacco Use  Smoking Status Former   Current packs/day: 0.00   Types: Cigarettes   Quit date: 04/05/2006   Years since quitting: 16.8  Smokeless Tobacco Never    Social History   Substance and Sexual Activity  Alcohol Use No     Social History   Socioeconomic History   Marital status: Divorced    Spouse name: Not on file   Number of children: Not on file   Years of education: Not on file   Highest education level: Not on file  Occupational History   Not on file  Tobacco Use   Smoking status: Former    Current packs/day: 0.00    Types: Cigarettes    Quit date: 04/05/2006    Years since quitting: 16.8   Smokeless tobacco: Never  Substance and Sexual Activity   Alcohol use: No   Drug use: No   Sexual activity: Not on file  Other Topics Concern   Not on file  Social History Narrative   Not on file   Social Determinants of Health   Financial Resource Strain: Not on file  Food Insecurity: No Food Insecurity (12/16/2022)   Hunger Vital Sign    Worried About Running Out of Food in the Last Year: Never true    Ran Out of Food in the Last Year: Never true  Transportation Needs: No Transportation Needs (12/16/2022)   PRAPARE - Administrator, Civil Service (Medical): No    Lack of Transportation (Non-Medical): No  Physical Activity: Not on file  Stress: Not on file  Social Connections: Not on file  Intimate Partner Violence: Not At Risk (12/16/2022)  Humiliation, Afraid, Rape, and Kick questionnaire    Fear of Current or Ex-Partner: No    Emotionally Abused: No    Physically Abused: No    Sexually Abused: No    No Known Allergies  Current Outpatient Medications  Medication Sig Dispense Refill   allopurinol (ZYLOPRIM) 300 MG tablet Take 300 mg by mouth at bedtime.     amiodarone (PACERONE) 200 MG tablet Take 1 tablet (200mg ) by mouth 2 (two) times daily for one week;  then take 1 tablet (200mg ) once daily thereafter 40 tablet 2   aspirin 81 MG chewable tablet Chew 1 tablet (81 mg total) by mouth daily. 100 tablet 2   atorvastatin (LIPITOR) 80 MG tablet Take 1 tablet (80 mg total) by mouth daily. 30 tablet 1   clopidogrel (PLAVIX) 75 MG tablet Take 1 tablet (75 mg total) by mouth  daily. 30 tablet 11   empagliflozin (JARDIANCE) 10 MG TABS tablet Take 1 tablet (10 mg total) by mouth daily. 30 tablet 2   furosemide (LASIX) 40 MG tablet Take 1 tablet (40 mg total) by mouth daily. 30 tablet 1   metFORMIN (GLUCOPHAGE-XR) 500 MG 24 hr tablet Take 500 mg by mouth at bedtime.     metoprolol succinate (TOPROL-XL) 25 MG 24 hr tablet Take 1 tablet (25 mg total) by mouth daily. 30 tablet 2   Omega-3 Fatty Acids (FISH OIL PO) Take 1 capsule by mouth at bedtime.     potassium chloride SA (KLOR-CON M) 20 MEQ tablet Take 1 tablet (20 mEq total) by mouth daily. 30 tablet 1   tamsulosin (FLOMAX) 0.4 MG CAPS capsule Take 1 capsule (0.4 mg total) by mouth daily. 30 capsule 0   No current facility-administered medications for this visit.     No family history on file.     Physical Exam: Incision looks good Lungs: clear Card: RR Ext: Warm no edema Neuro: intact     Diagnostic Studies & Laboratory data: I have personally reviewed the following studies and agree with the findings     Recent Radiology Findings:       Recent Lab Findings: Lab Results  Component Value Date   WBC 12.2 (H) 12/25/2022   HGB 8.7 (L) 12/25/2022   HCT 26.9 (L) 12/25/2022   PLT 156 12/25/2022   GLUCOSE 114 (H) 12/28/2022   CHOL 203 (H) 12/16/2022   TRIG 126 12/16/2022   HDL 39 (L) 12/16/2022   LDLCALC 139 (H) 12/16/2022   ALT 18 12/17/2022   AST 49 (H) 12/17/2022   NA 138 12/28/2022   K 3.7 12/28/2022   CL 100 12/28/2022   CREATININE 1.14 12/28/2022   BUN 20 12/28/2022   CO2 25 12/28/2022   INR 1.3 (H) 12/22/2022   HGBA1C 9.1 (H) 12/16/2022      Assessment / Plan:     SP CABG. Doing well. Will have cardiac rehab reach out to him. We reviewed restrictions and he will be able to drive now (once he renews his liscence) and will hold off on lifting for the next 3 weeks. He will stop his amiodarone, lasix and potassium. He has an appt to see cardiology in a week. He will follow up here  PRN   I have spent 30 min in review of the records, viewing studies and in face to face with patient and in coordination of future care    Eugenio Hoes 01/26/2023 4:41 PM

## 2023-01-27 ENCOUNTER — Other Ambulatory Visit: Payer: Self-pay

## 2023-01-27 ENCOUNTER — Ambulatory Visit (INDEPENDENT_AMBULATORY_CARE_PROVIDER_SITE_OTHER): Payer: Self-pay | Admitting: Thoracic Surgery (Cardiothoracic Vascular Surgery)

## 2023-01-27 VITALS — BP 138/65 | HR 88 | Resp 20 | Ht 69.5 in | Wt 154.0 lb

## 2023-01-27 DIAGNOSIS — I5020 Unspecified systolic (congestive) heart failure: Secondary | ICD-10-CM | POA: Diagnosis not present

## 2023-01-27 DIAGNOSIS — Z951 Presence of aortocoronary bypass graft: Secondary | ICD-10-CM | POA: Diagnosis not present

## 2023-01-27 DIAGNOSIS — E119 Type 2 diabetes mellitus without complications: Secondary | ICD-10-CM | POA: Diagnosis not present

## 2023-01-27 DIAGNOSIS — I10 Essential (primary) hypertension: Secondary | ICD-10-CM | POA: Diagnosis not present

## 2023-01-27 NOTE — Patient Instructions (Signed)
Stop amiodarone, lasix, potassium Cardiac rehab Cardiology follow up

## 2023-01-27 NOTE — Progress Notes (Signed)
Outpatient referral placed to [facility] per Dr. Leafy Ro for Cardiac Rehab.

## 2023-01-28 ENCOUNTER — Telehealth (HOSPITAL_COMMUNITY): Payer: Self-pay

## 2023-01-28 DIAGNOSIS — E119 Type 2 diabetes mellitus without complications: Secondary | ICD-10-CM | POA: Diagnosis not present

## 2023-01-28 DIAGNOSIS — I4891 Unspecified atrial fibrillation: Secondary | ICD-10-CM | POA: Diagnosis not present

## 2023-01-28 DIAGNOSIS — I5022 Chronic systolic (congestive) heart failure: Secondary | ICD-10-CM | POA: Diagnosis not present

## 2023-01-28 DIAGNOSIS — I951 Orthostatic hypotension: Secondary | ICD-10-CM | POA: Diagnosis not present

## 2023-01-28 DIAGNOSIS — I2510 Atherosclerotic heart disease of native coronary artery without angina pectoris: Secondary | ICD-10-CM | POA: Diagnosis not present

## 2023-01-28 DIAGNOSIS — Z48812 Encounter for surgical aftercare following surgery on the circulatory system: Secondary | ICD-10-CM | POA: Diagnosis not present

## 2023-01-28 DIAGNOSIS — I2109 ST elevation (STEMI) myocardial infarction involving other coronary artery of anterior wall: Secondary | ICD-10-CM | POA: Diagnosis not present

## 2023-01-28 DIAGNOSIS — I255 Ischemic cardiomyopathy: Secondary | ICD-10-CM | POA: Diagnosis not present

## 2023-01-28 DIAGNOSIS — I110 Hypertensive heart disease with heart failure: Secondary | ICD-10-CM | POA: Diagnosis not present

## 2023-01-28 NOTE — Telephone Encounter (Signed)
Patient contacted again regarding referral for cardiac rehab. Left VM for patient to call if he is interested in cardiac rehab.  He was contacted 01/13/23 and stated that he was not interested in the program and was exercising enough on his own. We have also mailed out information regarding cardiac rehab.

## 2023-02-11 ENCOUNTER — Ambulatory Visit: Payer: Medicare PPO | Attending: Physician Assistant | Admitting: Nurse Practitioner

## 2023-02-11 ENCOUNTER — Encounter: Payer: Self-pay | Admitting: Nurse Practitioner

## 2023-02-11 VITALS — BP 128/70 | HR 83 | Ht 69.0 in | Wt 158.8 lb

## 2023-02-11 DIAGNOSIS — Z951 Presence of aortocoronary bypass graft: Secondary | ICD-10-CM

## 2023-02-11 DIAGNOSIS — I2510 Atherosclerotic heart disease of native coronary artery without angina pectoris: Secondary | ICD-10-CM

## 2023-02-11 DIAGNOSIS — Z79899 Other long term (current) drug therapy: Secondary | ICD-10-CM

## 2023-02-11 DIAGNOSIS — I10 Essential (primary) hypertension: Secondary | ICD-10-CM | POA: Diagnosis not present

## 2023-02-11 DIAGNOSIS — I255 Ischemic cardiomyopathy: Secondary | ICD-10-CM | POA: Diagnosis not present

## 2023-02-11 DIAGNOSIS — I2109 ST elevation (STEMI) myocardial infarction involving other coronary artery of anterior wall: Secondary | ICD-10-CM

## 2023-02-11 DIAGNOSIS — I5021 Acute systolic (congestive) heart failure: Secondary | ICD-10-CM

## 2023-02-11 DIAGNOSIS — I252 Old myocardial infarction: Secondary | ICD-10-CM

## 2023-02-11 MED ORDER — METOPROLOL SUCCINATE ER 25 MG PO TB24: 25.0000 mg | ORAL_TABLET | Freq: Every day | ORAL | 2 refills | Status: DC

## 2023-02-11 MED ORDER — CLOPIDOGREL BISULFATE 75 MG PO TABS: 75.0000 mg | ORAL_TABLET | Freq: Every day | ORAL | 11 refills | Status: DC

## 2023-02-11 MED ORDER — EMPAGLIFLOZIN 10 MG PO TABS: 10.0000 mg | ORAL_TABLET | Freq: Every day | ORAL | 2 refills | Status: DC

## 2023-02-11 MED ORDER — ATORVASTATIN CALCIUM 80 MG PO TABS: 80.0000 mg | ORAL_TABLET | Freq: Every day | ORAL | 1 refills | Status: DC

## 2023-02-11 MED ORDER — ASPIRIN 81 MG PO CHEW: 81.0000 mg | CHEWABLE_TABLET | Freq: Every day | ORAL | 2 refills | Status: DC

## 2023-02-11 NOTE — Patient Instructions (Addendum)
Medication Instructions:  Your physician recommends that you continue on your current medications as directed. Please refer to the Current Medication list given to you today.  Labwork: In 1-2 weeks at [facility]   Testing/Procedures: None   Follow-Up: Your physician recommends that you schedule a follow-up appointment in: 6-8 weeks Terry White   Any Other Special Instructions Will Be Listed Below (If Applicable).  If you need a refill on your cardiac medications before your next appointment, please call your pharmacy.

## 2023-02-11 NOTE — Progress Notes (Unsigned)
Cardiology Office Note:  .   Date:  02/11/2023 ID:  Eartha Inch, DOB 21-Jun-1940, MRN [id number] PCP: Lovey Newcomer, PA  [facility] Providers Cardiologist:  Verne Carrow, MD    History of Present Illness: .   Terry White is a 82 y.o. 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   Patient was without cardiac history who was hospitalized in September 2024 for chest pain, diagnosed with STEMI and sent to [facility].  Heart catheterization revealed severe CAD, only able to balloon distal LAD.  Cardiothoracic surgery was consulted to evaluate for CABG.  Echocardiogram revealed EF 25 to 30%, advanced heart failure team was consulted for medical optimization prior to surgery.  Patient was diuresed and started on digoxin, cardiogenic shock was resolved.  Underwent three-vessel CABG on December 23, 2022.  LIMA was grafted to left anterior descending coronary artery, separate saphenous vein grafts were placed to obtuse marginal and posterior descending coronary arteries.  Did develop some postop A-fib, and received IV metoprolol that caused him to convert back to sinus rhythm.  Amiodarone was continued.  Today he presents for hospital follow-up.  He states he is doing well. Does have some occasional soreness along chest from surgery. Denies any anginal chest pain, shortness of breath, palpitations, syncope, presyncope, dizziness, orthopnea, PND, swelling or significant weight changes, acute bleeding, or claudication. ROS: Negative. See HPI.  Studies Reviewed: Marland Kitchen    TEE 12/2022: POST-OP IMPRESSIONS  _ Left Ventricle: has mildly reduced systolic function, with an ejection  fraction of 40%. The cavity size was normal. The wall motion is abnormal  with  regional variation.  _ Right  Ventricle: The right ventricle appears unchanged from pre-bypass.  _ Aorta: The aorta appears unchanged from pre-bypass.  _ Left Atrial Appendage: The left atrial appendage appears unchanged from  pre-bypass.  _ Aortic Valve: The aortic valve appears unchanged from pre-bypass.  _ Mitral Valve: There is mild regurgitation.  _ Tricuspid Valve: There is no regurgitation.  _ Pulmonic Valve: The pulmonic valve appears unchanged from pre-bypass.  _ Interatrial Septum: The interatrial septum appears unchanged from  pre-bypass.  _ Pericardium: The pericardium appears unchanged from pre-bypass.  _ Comments: Post-bypass images reviewed with surgeon. Limitations of exam  unchanged from pre-bypass exam.   PRE-OP FINDINGS   Left Ventricle: The left ventricle has mild-moderately reduced systolic  function, with an ejection fraction of 40-45%. The cavity size was normal.  Septal and apical hypokinesis.    Right Ventricle: The right ventricle has normal systolic function. The  cavity was normal. There is no increase in right ventricular wall  thickness.   Left Atrium: Left atrial size was normal in size. No left atrial/left  atrial appendage thrombus was detected.   Right Atrium: Right atrial size was normal in size.   Interatrial Septum: No atrial level shunt detected by color flow Doppler.   Pericardium: There is no evidence of pericardial effusion.   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 Mitral valve regurgitation  is mild by color flow Doppler.   Tricuspid Valve: The tricuspid valve was normal in structure. Tricuspid  valve regurgitation is trivial by color flow Doppler.   Aortic Valve: The aortic valve is tricuspid Aortic valve regurgitation was  not visualized by color flow Doppler.    Pulmonic Valve: The  pulmonic valve was normal in structure.  Pulmonic valve regurgitation is mild by color flow Doppler.    Aorta: The aortic root, ascending aorta and aortic arch are  normal in size  and structure. The descending aorta was not well visualized. Unable to  visualize descending aorta.    Vascular ultrasound dopplers Pre-CABG 12/2022: Summary:  Right Carotid: Velocities in the right ICA are consistent with a 1-39%  stenosis.   Left Carotid: Velocities in the left ICA are consistent with a 1-39%  stenosis.  Vertebrals: Bilateral vertebral arteries demonstrate antegrade flow.   Right ABI: Resting right ankle-brachial index is within normal range.  Left ABI: Resting left ankle-brachial index is within normal range.  Left Upper Extremity: Doppler waveform obliterate with left radial  compression. Doppler waveforms decrease >50% with left ulnar compression.  Echo 12/2022:   1. Left ventricular ejection fraction, by estimation, is 25 to 30%. The  left ventricle has severely decreased function. The left ventricle  demonstrates regional wall motion abnormalities with mid to apical  anteroseptal/inferoseptal akinesis, apical  anterior/inferior/lateral akinesis, and akinesis of the true apex. No LV  thrombus noted. There is mild concentric left ventricular hypertrophy.  Left ventricular diastolic parameters are consistent with Grade I  diastolic dysfunction (impaired relaxation).   2. Right ventricular systolic function is normal. The right ventricular  size is normal. There is normal pulmonary artery systolic pressure. The  estimated right ventricular systolic pressure is 21.5 mmHg.   3. The mitral valve is degenerative. Mild mitral valve regurgitation. No  evidence of mitral stenosis.   4. The aortic valve is tricuspid. There is mild calcification of the  aortic valve. Aortic valve regurgitation is not visualized. Aortic valve  sclerosis is present, with no evidence of aortic valve stenosis.   5. The inferior vena cava is normal in size with <50% respiratory  variability, suggesting right atrial pressure of 8 mmHg.   Comparison(s): No prior  Echocardiogram.  LHC 12/2022:   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   Prox RCA lesion is 100% stenosed.   Mid Cx to Dist Cx lesion is 100% stenosed.   Mid LAD-1 lesion is 99% stenosed.   Mid LAD-2 lesion is 99% stenosed.   Balloon angioplasty was performed using a BALLN EMERGE MR 2.0X15.   Balloon angioplasty was performed using a BALLN [place] EMERGE MR 2.5X15.   Post intervention, there is a 90% residual stenosis.   Post intervention, there is a 99% residual stenosis.   Acute anterior STEMI The LAD is a large caliber vessel that courses to the apex. The mid LAD has serial 99% heavily calcified stenoses. The distal LAD has mild non-obstructive disease The Circumflex has 100% chronic occlusion in the mid AV groove segment. The distal AV groove Circumflex fills from left to left collaterals.  The large dominant RCA has 100% chronic proximal occlusion. The distal RCA fills from right to right and left to right collaterals.  Balloon angioplasty of the mid LAD with inability to full expand the balloon due to heavy calcification. TIMI-3 flow at the conclusion of the case. The patient was chest pain free.    Recommendations: Will admit to ICU. I was unable to fully expand the lesions in the mid LAD but flow down the LAD was improved with balloon angioplasty and with medical therapy. 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 Will ask CT surgery to  see him to discuss bypass surgery. Echo later today. Continue Aggrastat infusion for now. Will continue ASA and start a high intensity statin. (No oral anti-platelet agent was given).   Physical Exam:   VS:  BP 128/70   Pulse 83   Ht 5\' 9"  (1.753 m)   Wt 158 lb 12.8 oz (72 kg)   SpO2 97%   BMI 23.45 kg/m    Wt Readings from Last 3 Encounters:  02/11/23 158 lb 12.8 oz (72 kg)  01/27/23 154 lb (69.9 kg)  01/06/23 148 lb (67.1 kg)    GEN: Well nourished,  well developed in no acute distress NECK: No JVD; No carotid bruits CARDIAC: S1/S2, RRR, no murmurs, rubs, gallops RESPIRATORY:  Clear to auscultation without rales, wheezing or rhonchi  ABDOMEN: Soft, non-tender, non-distended EXTREMITIES:  No edema; No deformity   ASSESSMENT AND PLAN: .    CAD, s/p CABG x 3 (12/2022), hx of acute STEMI, medication management Well post CABG.  Denies any chest pain/anginal symptoms.  Continue aspirin, atorvastatin, Plavix, and Toprol-XL. Heart healthy diet and regular cardiovascular exercise encouraged. Sternal incision is healing well and he is following post care precautions well. Will obtain CBC, CMET, lipid profile and Mag.   2. HFmrEF, ICM Stage C, NYHA class I-II symptoms.  Initial echo during recent hospitalization revealed EF 25 to 30%, TEE afterwards revealed improved EF to 40%.  GDMT limited at this time due to patient's BP.  Continue current medication regimen. Low sodium diet, fluid restriction <2L, and daily weights encouraged. Educated to contact our office for weight gain of 2 lbs overnight or 5 lbs in one week. Obtaining labs as mentioned above.   3. HTN BP stable. Discussed to monitor BP at home at least 2 hours after medications and sitting for 5-10 minutes. No medication changes at this time. Heart healthy diet and regular cardiovascular exercise encouraged.   Dispo: Will provide refills per his request. Follow-up with me/APP in 6-8 weeks or sooner if anything changes.   Signed, Sharlene Dory, NP

## 2023-02-18 DIAGNOSIS — R944 Abnormal results of kidney function studies: Secondary | ICD-10-CM | POA: Diagnosis not present

## 2023-02-18 DIAGNOSIS — R7989 Other specified abnormal findings of blood chemistry: Secondary | ICD-10-CM | POA: Diagnosis not present

## 2023-02-18 DIAGNOSIS — E875 Hyperkalemia: Secondary | ICD-10-CM | POA: Diagnosis not present

## 2023-02-18 DIAGNOSIS — D649 Anemia, unspecified: Secondary | ICD-10-CM | POA: Diagnosis not present

## 2023-02-18 DIAGNOSIS — E119 Type 2 diabetes mellitus without complications: Secondary | ICD-10-CM | POA: Diagnosis not present

## 2023-02-18 DIAGNOSIS — I10 Essential (primary) hypertension: Secondary | ICD-10-CM | POA: Diagnosis not present

## 2023-02-22 ENCOUNTER — Other Ambulatory Visit (HOSPITAL_COMMUNITY)
Admission: RE | Admit: 2023-02-22 | Discharge: 2023-02-22 | Disposition: A | Discharge status: Home / Self Care | Payer: Medicare PPO | Source: Ambulatory Visit | Attending: Nurse Practitioner | Admitting: Nurse Practitioner

## 2023-02-22 DIAGNOSIS — Z951 Presence of aortocoronary bypass graft: Secondary | ICD-10-CM | POA: Diagnosis not present

## 2023-02-22 DIAGNOSIS — I252 Old myocardial infarction: Secondary | ICD-10-CM | POA: Insufficient documentation

## 2023-02-22 DIAGNOSIS — Z79899 Other long term (current) drug therapy: Secondary | ICD-10-CM | POA: Insufficient documentation

## 2023-02-22 LAB — COMPREHENSIVE METABOLIC PANEL
ALT: 14 U/L (ref 0–44)
AST: 19 U/L (ref 15–41)
Albumin: 3.4 g/dL — ABNORMAL LOW (ref 3.5–5.0)
Alkaline Phosphatase: 135 U/L — ABNORMAL HIGH (ref 38–126)
Anion gap: 11 (ref 5–15)
BUN: 11 mg/dL (ref 8–23)
CO2: 27 mmol/L (ref 22–32)
Calcium: 9.1 mg/dL (ref 8.9–10.3)
Chloride: 100 mmol/L (ref 98–111)
Creatinine, Ser: 0.82 mg/dL (ref 0.61–1.24)
GFR, Estimated: >60 mL/min (ref >60)
Glucose, Bld: 132 mg/dL — ABNORMAL HIGH (ref 70–99)
Potassium: 4.1 mmol/L (ref 3.5–5.1)
Sodium: 138 mmol/L (ref 135–145)
Total Bilirubin: 0.6 mg/dL (ref <1.2)
Total Protein: 7.6 g/dL (ref 6.5–8.1)

## 2023-02-22 LAB — CBC
HCT: 38.9 % — ABNORMAL LOW (ref 39.0–52.0)
Hemoglobin: 11.9 g/dL — ABNORMAL LOW (ref 13.0–17.0)
MCH: 29.7 pg (ref 26.0–34.0)
MCHC: 30.6 g/dL (ref 30.0–36.0)
MCV: 97 fL (ref 80.0–100.0)
Platelets: 278 10*3/uL (ref 150–400)
RBC: 4.01 MIL/uL — ABNORMAL LOW (ref 4.22–5.81)
RDW: 15.1 % (ref 11.5–15.5)
WBC: 14.7 10*3/uL — ABNORMAL HIGH (ref 4.0–10.5)
nRBC: 0 % (ref 0.0–0.2)

## 2023-02-22 LAB — LIPID PANEL
Cholesterol: 118 mg/dL (ref 0–200)
HDL: 28 mg/dL — ABNORMAL LOW (ref >40)
LDL Cholesterol: 45 mg/dL (ref 0–99)
Total CHOL/HDL Ratio: 4.2 {ratio}
Triglycerides: 227 mg/dL — ABNORMAL HIGH (ref <150)
VLDL: 45 mg/dL — ABNORMAL HIGH (ref 0–40)

## 2023-02-22 LAB — MAGNESIUM: Magnesium: 1.4 mg/dL — ABNORMAL LOW (ref 1.7–2.4)

## 2023-02-24 DIAGNOSIS — D649 Anemia, unspecified: Secondary | ICD-10-CM | POA: Diagnosis not present

## 2023-02-24 DIAGNOSIS — R030 Elevated blood-pressure reading, without diagnosis of hypertension: Secondary | ICD-10-CM | POA: Diagnosis not present

## 2023-02-24 DIAGNOSIS — E119 Type 2 diabetes mellitus without complications: Secondary | ICD-10-CM | POA: Diagnosis not present

## 2023-02-24 DIAGNOSIS — K219 Gastro-esophageal reflux disease without esophagitis: Secondary | ICD-10-CM | POA: Diagnosis not present

## 2023-02-24 DIAGNOSIS — M109 Gout, unspecified: Secondary | ICD-10-CM | POA: Diagnosis not present

## 2023-02-24 DIAGNOSIS — Z6824 Body mass index (BMI) 24.0-24.9, adult: Secondary | ICD-10-CM | POA: Diagnosis not present

## 2023-02-24 DIAGNOSIS — R944 Abnormal results of kidney function studies: Secondary | ICD-10-CM | POA: Diagnosis not present

## 2023-02-24 DIAGNOSIS — I10 Essential (primary) hypertension: Secondary | ICD-10-CM | POA: Diagnosis not present

## 2023-03-25 ENCOUNTER — Other Ambulatory Visit: Payer: Self-pay | Admitting: Nurse Practitioner

## 2023-03-25 ENCOUNTER — Telehealth: Payer: Self-pay | Admitting: Cardiovascular Disease

## 2023-03-25 DIAGNOSIS — R790 Abnormal level of blood mineral: Secondary | ICD-10-CM

## 2023-03-25 NOTE — Telephone Encounter (Signed)
Patient informed and verbalized understanding of plan of results

## 2023-03-25 NOTE — Telephone Encounter (Signed)
Patient is returning call. Please advise? 

## 2023-04-01 ENCOUNTER — Ambulatory Visit: Payer: Medicare PPO | Attending: Nurse Practitioner | Admitting: Nurse Practitioner

## 2023-04-01 VITALS — BP 114/72 | HR 88 | Ht 69.5 in | Wt 155.2 lb

## 2023-04-01 DIAGNOSIS — I2510 Atherosclerotic heart disease of native coronary artery without angina pectoris: Secondary | ICD-10-CM

## 2023-04-01 DIAGNOSIS — I255 Ischemic cardiomyopathy: Secondary | ICD-10-CM

## 2023-04-01 DIAGNOSIS — R790 Abnormal level of blood mineral: Secondary | ICD-10-CM | POA: Diagnosis not present

## 2023-04-01 DIAGNOSIS — Z951 Presence of aortocoronary bypass graft: Secondary | ICD-10-CM

## 2023-04-01 DIAGNOSIS — Z79899 Other long term (current) drug therapy: Secondary | ICD-10-CM

## 2023-04-01 DIAGNOSIS — I10 Essential (primary) hypertension: Secondary | ICD-10-CM

## 2023-04-01 DIAGNOSIS — D72829 Elevated white blood cell count, unspecified: Secondary | ICD-10-CM

## 2023-04-01 DIAGNOSIS — I5022 Chronic systolic (congestive) heart failure: Secondary | ICD-10-CM

## 2023-04-01 MED ORDER — METOPROLOL SUCCINATE ER 25 MG PO TB24: 25.0000 mg | ORAL_TABLET | Freq: Every day | ORAL | 2 refills | Status: DC

## 2023-04-01 MED ORDER — MAGNESIUM OXIDE 400 MG PO CAPS: 1.0000 | ORAL_CAPSULE | Freq: Every day | ORAL | 2 refills | Status: AC

## 2023-04-01 MED ORDER — EMPAGLIFLOZIN 10 MG PO TABS: 10.0000 mg | ORAL_TABLET | Freq: Every day | ORAL | 2 refills | Status: DC

## 2023-04-01 MED ORDER — CLOPIDOGREL BISULFATE 75 MG PO TABS: 75.0000 mg | ORAL_TABLET | Freq: Every day | ORAL | 11 refills | Status: AC

## 2023-04-01 MED ORDER — ATORVASTATIN CALCIUM 80 MG PO TABS: 80.0000 mg | ORAL_TABLET | Freq: Every day | ORAL | 1 refills | Status: DC

## 2023-04-01 NOTE — Progress Notes (Signed)
Cardiology Office Note:  .   Date:  04/01/2023 ID:  Terry White, DOB 1941/03/12, MRN [id number] PCP: Lovey Newcomer, PA  [facility] Providers Cardiologist:  Verne Carrow, MD    History of Present Illness: .   Terry White is a 82 y.o. 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   Patient was without cardiac history who was hospitalized in September 2024 for chest pain, diagnosed with STEMI and sent to [facility] Cath Lab.  Heart catheterization revealed severe CAD, only able to balloon distal LAD.  Cardiothoracic surgery was consulted to evaluate for CABG.  Echocardiogram revealed EF 25 to 30%, advanced heart failure team was consulted for medical optimization prior to surgery.  Patient was diuresed and started on digoxin, cardiogenic shock was resolved.  Underwent three-vessel CABG on December 23, 2022.  LIMA was grafted to left anterior descending coronary artery, separate saphenous vein grafts were placed to obtuse marginal and posterior descending coronary arteries.  Did develop some postop A-fib, and received IV metoprolol that caused him to convert back to sinus rhythm.  Amiodarone was continued.  I last saw him for hospital follow-up on February 11, 2023.  Was overall doing well.     Today he presents for follow-up.  He continues to do well and remains compliant with his medications, however he has not started his magnesium supplement.  He does admit to some memory issues.  He is also requesting [facility].  Overall doing well from a cardiac perspective.  Denies any chest pain, shortness of breath, palpitations, syncope, presyncope, dizziness, orthopnea, PND, swelling or significant weight changes, acute bleeding, or  claudication.   ROS: Negative. See HPI.  Studies Reviewed: Marland Kitchen    TEE 12/2022: POST-OP IMPRESSIONS  _ Left Ventricle: has mildly reduced systolic function, with an ejection  fraction of 40%. The cavity size was normal. The wall motion is abnormal  with  regional variation.  _ Right Ventricle: The right ventricle appears unchanged from pre-bypass.  _ Aorta: The aorta appears unchanged from pre-bypass.  _ Left Atrial Appendage: The left atrial appendage appears unchanged from  pre-bypass.  _ Aortic Valve: The aortic valve appears unchanged from pre-bypass.  _ Mitral Valve: There is mild regurgitation.  _ Tricuspid Valve: There is no regurgitation.  _ Pulmonic Valve: The pulmonic valve appears unchanged from pre-bypass.  _ Interatrial Septum: The interatrial septum appears unchanged from  pre-bypass.  _ Pericardium: The pericardium appears unchanged from pre-bypass.  _ Comments: Post-bypass images reviewed with surgeon. Limitations of exam  unchanged from pre-bypass exam.   PRE-OP FINDINGS   Left Ventricle: The left ventricle has mild-moderately reduced systolic  function, with an ejection fraction of 40-45%. The cavity size was normal.  Septal and apical hypokinesis.    Right Ventricle: The right ventricle has normal systolic function. The  cavity was normal. There is no increase in right ventricular wall  thickness.   Left Atrium: Left atrial size was normal in size. No left atrial/left  atrial appendage thrombus was detected.   Right Atrium: Right atrial size was normal in size.   Interatrial Septum: No atrial level shunt detected by color flow Doppler.   Pericardium: There is no evidence of pericardial effusion.   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 Mitral valve regurgitation  is mild by color flow Doppler.   Tricuspid Valve: The tricuspid valve was normal in structure. Tricuspid  valve regurgitation is trivial by color flow Doppler.   Aortic Valve: The  aortic valve is tricuspid Aortic valve regurgitation was  not visualized by color flow Doppler.    Pulmonic Valve: The pulmonic valve was normal in structure.  Pulmonic valve regurgitation is mild by color flow Doppler.    Aorta: The aortic root, ascending aorta and aortic arch are normal in size  and structure. The descending aorta was not well visualized. Unable to  visualize descending aorta.    Vascular ultrasound dopplers Pre-CABG 12/2022: Summary:  Right Carotid: Velocities in the right ICA are consistent with a 1-39%  stenosis.   Left Carotid: Velocities in the left ICA are consistent with a 1-39%  stenosis.  Vertebrals: Bilateral vertebral arteries demonstrate antegrade flow.   Right ABI: Resting right ankle-brachial index is within normal range.  Left ABI: Resting left ankle-brachial index is within normal range.  Left Upper Extremity: Doppler waveform obliterate with left radial  compression. Doppler waveforms decrease >50% with left ulnar compression.  Echo 12/2022:   1. Left ventricular ejection fraction, by estimation, is 25 to 30%. The  left ventricle has severely decreased function. The left ventricle  demonstrates regional wall motion abnormalities with mid to apical  anteroseptal/inferoseptal akinesis, apical  anterior/inferior/lateral akinesis, and akinesis of the true apex. No LV  thrombus noted. There is mild concentric left ventricular hypertrophy.  Left ventricular diastolic parameters are consistent with Grade I  diastolic dysfunction (impaired relaxation).   2. Right ventricular systolic function is normal. The right ventricular  size is normal. There is normal pulmonary artery systolic pressure. The  estimated right ventricular systolic pressure is 21.5 mmHg.   3. The mitral valve is degenerative. Mild mitral valve regurgitation. No  evidence of mitral stenosis.   4. The aortic valve is tricuspid. There is mild calcification of the  aortic valve.  Aortic valve regurgitation is not visualized. Aortic valve  sclerosis is present, with no evidence of aortic valve stenosis.   5. The inferior vena cava is normal in size with <50% respiratory  variability, suggesting right atrial pressure of 8 mmHg.   Comparison(s): No prior Echocardiogram.  LHC 12/2022:   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   Prox RCA lesion is 100% stenosed.   Mid Cx to Dist Cx lesion is 100% stenosed.   Mid LAD-1 lesion is 99% stenosed.   Mid LAD-2 lesion is 99% stenosed.   Balloon angioplasty was performed using a BALLN EMERGE MR 2.0X15.   Balloon angioplasty was performed using a BALLN [place] EMERGE MR 2.5X15.   Post intervention, there is a 90% residual stenosis.   Post intervention, there is a 99% residual stenosis.   Acute anterior STEMI The LAD is a large caliber vessel that courses to the apex. The mid LAD has serial 99% heavily calcified stenoses. The distal LAD has mild non-obstructive disease The Circumflex has 100% chronic occlusion in the mid AV groove segment. The distal AV groove Circumflex fills from left to left collaterals.  The large dominant RCA has 100% chronic proximal occlusion. The distal RCA fills from right to right and left to right collaterals.  Balloon angioplasty of the mid LAD with inability to full expand the balloon due to heavy calcification. TIMI-3 flow at the conclusion of the case. The patient was chest pain free.  Recommendations: Will admit to ICU. I was unable to fully expand the lesions in the mid LAD but flow down the LAD was improved with balloon angioplasty and with medical therapy. 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 Will ask CT surgery to see him to discuss bypass surgery. Echo later today. Continue Aggrastat infusion for now. Will continue ASA and start a high intensity statin. (No oral anti-platelet agent was given).    Physical Exam:   VS:  BP 114/72 (BP Location: Left Arm, Cuff Size: Normal)   Pulse 88   Ht 5' 9.5" (1.765 m)   Wt 155 lb 3.2 oz (70.4 kg)   SpO2 95%   BMI 22.59 kg/m    Wt Readings from Last 3 Encounters:  04/01/23 155 lb 3.2 oz (70.4 kg)  02/11/23 158 lb 12.8 oz (72 kg)  01/27/23 154 lb (69.9 kg)    GEN: Well nourished, well developed in no acute distress NECK: No JVD; No carotid bruits CARDIAC: S1/S2, RRR, no murmurs, rubs, gallops RESPIRATORY:  Clear to auscultation without rales, wheezing or rhonchi  ABDOMEN: Soft, non-tender, non-distended EXTREMITIES:  No edema; No deformity   ASSESSMENT AND PLAN: .    CAD, s/p CABG x 3 (12/2022), hx of acute STEMI Denies any chest pain/anginal symptoms.  Continue aspirin, atorvastatin, Plavix, and Toprol-XL. Heart healthy diet and regular cardiovascular exercise encouraged.   2. HFmrEF, ICM Stage C, NYHA class I-II symptoms.  Initial echo during recent hospitalization revealed EF 25 to 30%, TEE afterwards revealed improved EF to 40%.  GDMT limited at this time due to patient's BP.  Continue current medication regimen. Low sodium diet, fluid restriction <2L, and daily weights encouraged. Educated to contact our office for weight gain of 2 lbs overnight or 5 lbs in one week.  At next office visit, plan to discuss repeating limited echocardiogram with patient.  3. HTN BP stable. Discussed to monitor BP at home at least 2 hours after medications and sitting for 5-10 minutes. No medication changes at this time. Heart healthy diet and regular cardiovascular exercise encouraged.   4.  Low magnesium level, leukocytosis, medication management Recent lab work revealed WBC at 14.7, denies any fever, chills, recent sick contacts.  Blood work also revealed improving hemoglobin and magnesium level from November 2024 was 0.4, he has not started magnesium supplement per his report.  Instructed him to start this.  Will recheck a CBC with differential and  magnesium level.   Dispo: Will provide refills per his request and will [facility] to [facility] so patient can receive his medications via mail order. Follow-up with me/APP in 2 to 3 months or sooner if anything changes.   Signed, Sharlene Dory, NP

## 2023-04-01 NOTE — Patient Instructions (Addendum)
Medication Instructions:  Your physician has recommended you make the following change in your medication:  Start taking Magnesium Oxide 400 mg once daily can be picked up over the counter Continue taking all other medications as precribed  Labwork: CBC and Magnesium to be completed at [facility]   Testing/Procedures:   Follow-Up: Your physician recommends that you schedule a follow-up appointment in: 2-3 months  Any Other Special Instructions Will Be Listed Below (If Applicable).  Thank you for choosing [facility]!

## 2023-04-18 ENCOUNTER — Other Ambulatory Visit: Payer: Self-pay | Admitting: Nurse Practitioner

## 2023-04-29 ENCOUNTER — Other Ambulatory Visit (HOSPITAL_COMMUNITY)
Admission: RE | Admit: 2023-04-29 | Discharge: 2023-04-29 | Disposition: A | Discharge status: Home / Self Care | Payer: Medicare PPO | Source: Ambulatory Visit | Attending: Nurse Practitioner | Admitting: Nurse Practitioner

## 2023-04-29 DIAGNOSIS — Z79899 Other long term (current) drug therapy: Secondary | ICD-10-CM | POA: Insufficient documentation

## 2023-04-29 DIAGNOSIS — D72829 Elevated white blood cell count, unspecified: Secondary | ICD-10-CM | POA: Diagnosis not present

## 2023-04-29 LAB — CBC WITH DIFFERENTIAL/PLATELET
Abs Immature Granulocytes: 0.06 10*3/uL (ref 0.00–0.07)
Basophils Absolute: 0 10*3/uL (ref 0.0–0.1)
Basophils Relative: 0 %
Eosinophils Absolute: 0.5 10*3/uL (ref 0.0–0.5)
Eosinophils Relative: 4 %
HCT: 38 % — ABNORMAL LOW (ref 39.0–52.0)
Hemoglobin: 11.6 g/dL — ABNORMAL LOW (ref 13.0–17.0)
Immature Granulocytes: 0 %
Lymphocytes Relative: 13 %
Lymphs Abs: 1.8 10*3/uL (ref 0.7–4.0)
MCH: 27.4 pg (ref 26.0–34.0)
MCHC: 30.5 g/dL (ref 30.0–36.0)
MCV: 89.6 fL (ref 80.0–100.0)
Monocytes Absolute: 0.7 10*3/uL (ref 0.1–1.0)
Monocytes Relative: 5 %
Neutro Abs: 10.9 10*3/uL — ABNORMAL HIGH (ref 1.7–7.7)
Neutrophils Relative %: 78 %
Platelets: 390 10*3/uL (ref 150–400)
RBC: 4.24 MIL/uL (ref 4.22–5.81)
RDW: 15 % (ref 11.5–15.5)
WBC: 14.1 10*3/uL — ABNORMAL HIGH (ref 4.0–10.5)
nRBC: 0 % (ref 0.0–0.2)

## 2023-04-29 LAB — MAGNESIUM: Magnesium: 1.9 mg/dL (ref 1.7–2.4)

## 2023-05-09 DIAGNOSIS — D72828 Other elevated white blood cell count: Secondary | ICD-10-CM | POA: Diagnosis not present

## 2023-05-09 DIAGNOSIS — D72829 Elevated white blood cell count, unspecified: Secondary | ICD-10-CM | POA: Diagnosis not present

## 2023-05-09 DIAGNOSIS — Z6822 Body mass index (BMI) 22.0-22.9, adult: Secondary | ICD-10-CM | POA: Diagnosis not present

## 2023-06-02 ENCOUNTER — Other Ambulatory Visit: Payer: Self-pay | Admitting: Nurse Practitioner

## 2023-06-14 ENCOUNTER — Ambulatory Visit: Payer: Medicare PPO | Attending: Nurse Practitioner | Admitting: Nurse Practitioner

## 2023-06-14 ENCOUNTER — Encounter: Payer: Self-pay | Admitting: Nurse Practitioner

## 2023-06-14 VITALS — BP 122/70 | HR 86 | Ht 64.0 in | Wt 149.0 lb

## 2023-06-14 DIAGNOSIS — I5022 Chronic systolic (congestive) heart failure: Secondary | ICD-10-CM | POA: Diagnosis not present

## 2023-06-14 DIAGNOSIS — D72829 Elevated white blood cell count, unspecified: Secondary | ICD-10-CM | POA: Diagnosis not present

## 2023-06-14 DIAGNOSIS — I10 Essential (primary) hypertension: Secondary | ICD-10-CM | POA: Diagnosis not present

## 2023-06-14 DIAGNOSIS — I255 Ischemic cardiomyopathy: Secondary | ICD-10-CM

## 2023-06-14 DIAGNOSIS — G629 Polyneuropathy, unspecified: Secondary | ICD-10-CM | POA: Diagnosis not present

## 2023-06-14 DIAGNOSIS — I2510 Atherosclerotic heart disease of native coronary artery without angina pectoris: Secondary | ICD-10-CM

## 2023-06-14 NOTE — Patient Instructions (Addendum)
 Medication Instructions:  Your physician recommends that you continue on your current medications as directed. Please refer to the Current Medication list given to you today.  Labwork: None   Testing/Procedures: Your physician has requested that you have an echocardiogram. Echocardiography is a painless test that uses sound waves to create images of your heart. It provides your doctor with information about the size and shape of your heart and how well your heart's chambers and valves are working. This procedure takes approximately one hour. There are no restrictions for this procedure. Please do NOT wear cologne, perfume, aftershave, or lotions (deodorant is allowed). Please arrive 15 minutes prior to your appointment time.  Please note: We ask at that you not bring children with you during ultrasound (echo/ vascular) testing. Due to room size and safety concerns, children are not allowed in the ultrasound rooms during exams. Our front office staff cannot provide observation of children in our lobby area while testing is being conducted. An adult accompanying a patient to their appointment will only be allowed in the ultrasound room at the discretion of the ultrasound technician under special circumstances. We apologize for any inconvenience.  Follow-Up: Your physician recommends that you schedule a follow-up appointment in: 2-3 months   Any Other Special Instructions Will Be Listed Below (If Applicable).  If you need a refill on your cardiac medications before your next appointment, please call your pharmacy.

## 2023-06-14 NOTE — Progress Notes (Unsigned)
 Cardiology Office Note:  .   Date:  06/14/2023 ID:  Terry White, DOB 02/10/1941, MRN [id number] PCP: Lovey Newcomer, PA  [facility] Providers Cardiologist:  Verne Carrow, MD    History of Present Illness: .   Terry White is a 83 y.o. 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   Patient was without cardiac history who was hospitalized in September 2024 for chest pain, diagnosed with STEMI and sent to [facility] Cath Lab.  Heart catheterization revealed severe CAD, only able to balloon distal LAD.  Cardiothoracic surgery was consulted to evaluate for CABG.  Echocardiogram revealed EF 25 to 30%, advanced heart failure team was consulted for medical optimization prior to surgery.  Patient was diuresed and started on digoxin, cardiogenic shock was resolved.  Underwent three-vessel CABG on December 23, 2022.  LIMA was grafted to left anterior descending coronary artery, separate saphenous vein grafts were placed to obtuse marginal and posterior descending coronary arteries.  Did develop some postop A-fib, and received IV metoprolol that caused him to convert back to sinus rhythm.  Amiodarone was continued.  04/01/2023 - Today he presents for follow-up.  He continues to do well and remains compliant with his medications, however he has not started his magnesium supplement.  He does admit to some memory issues.  He is also requesting to have [facility] in via [facility] order.  Overall doing well from a cardiac perspective.  Denies any chest pain, shortness of breath, palpitations, syncope, presyncope, dizziness, orthopnea, PND, swelling or significant weight changes, acute bleeding, or claudication.   06/14/2023 - Admits to having some chest congestion not long ago  from what sounds like a URI. Reports burning sensation in his feet, causes him to feel off balance at times, has been ongoing for many years. Denies any chest pain, shortness of breath, palpitations, syncope, presyncope, dizziness, orthopnea, PND, swelling or significant weight changes, acute bleeding, or claudication. Denies any recent falls.   ROS: Negative. See HPI.  Studies Reviewed: Marland Kitchen    EKG: EKG is not ordered today.  TEE 12/2022: POST-OP IMPRESSIONS  _ Left Ventricle: has mildly reduced systolic function, with an ejection  fraction of 40%. The cavity size was normal. The wall motion is abnormal  with  regional variation.  _ Right Ventricle: The right ventricle appears unchanged from pre-bypass.  _ Aorta: The aorta appears unchanged from pre-bypass.  _ Left Atrial Appendage: The left atrial appendage appears unchanged from  pre-bypass.  _ Aortic Valve: The aortic valve appears unchanged from pre-bypass.  _ Mitral Valve: There is mild regurgitation.  _ Tricuspid Valve: There is no regurgitation.  _ Pulmonic Valve: The pulmonic valve appears unchanged from pre-bypass.  _ Interatrial Septum: The interatrial septum appears unchanged from  pre-bypass.  _ Pericardium: The pericardium appears unchanged from pre-bypass.  _ Comments: Post-bypass images reviewed with surgeon. Limitations of exam  unchanged from pre-bypass exam.   PRE-OP FINDINGS   Left Ventricle: The left ventricle has mild-moderately reduced systolic  function, with an ejection fraction of 40-45%. The cavity size was normal.  Septal and apical hypokinesis.    Right Ventricle: The right ventricle has normal systolic function. The  cavity was normal. There is no increase in right ventricular wall  thickness.   Left Atrium: Left atrial  size was normal in size. No left atrial/left  atrial appendage thrombus was detected.   Right Atrium: Right atrial size was normal in size.   Interatrial Septum: No atrial level  shunt detected by color flow Doppler.   Pericardium: There is no evidence of pericardial effusion.   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 Mitral valve regurgitation  is mild by color flow Doppler.   Tricuspid Valve: The tricuspid valve was normal in structure. Tricuspid  valve regurgitation is trivial by color flow Doppler.   Aortic Valve: The aortic valve is tricuspid Aortic valve regurgitation was  not visualized by color flow Doppler.    Pulmonic Valve: The pulmonic valve was normal in structure.  Pulmonic valve regurgitation is mild by color flow Doppler.    Aorta: The aortic root, ascending aorta and aortic arch are normal in size  and structure. The descending aorta was not well visualized. Unable to  visualize descending aorta.    Vascular ultrasound dopplers Pre-CABG 12/2022: Summary:  Right Carotid: Velocities in the right ICA are consistent with a 1-39%  stenosis.   Left Carotid: Velocities in the left ICA are consistent with a 1-39%  stenosis.  Vertebrals: Bilateral vertebral arteries demonstrate antegrade flow.   Right ABI: Resting right ankle-brachial index is within normal range.  Left ABI: Resting left ankle-brachial index is within normal range.  Left Upper Extremity: Doppler waveform obliterate with left radial  compression. Doppler waveforms decrease >50% with left ulnar compression.  Echo 12/2022:   1. Left ventricular ejection fraction, by estimation, is 25 to 30%. The  left ventricle has severely decreased function. The left ventricle  demonstrates regional wall motion abnormalities with mid to apical  anteroseptal/inferoseptal akinesis, apical  anterior/inferior/lateral akinesis, and akinesis of the true apex. No LV  thrombus noted. There is mild concentric left ventricular hypertrophy.  Left ventricular diastolic parameters are consistent with Grade I  diastolic dysfunction (impaired relaxation).   2. Right ventricular systolic function is  normal. The right ventricular  size is normal. There is normal pulmonary artery systolic pressure. The  estimated right ventricular systolic pressure is 21.5 mmHg.   3. The mitral valve is degenerative. Mild mitral valve regurgitation. No  evidence of mitral stenosis.   4. The aortic valve is tricuspid. There is mild calcification of the  aortic valve. Aortic valve regurgitation is not visualized. Aortic valve  sclerosis is present, with no evidence of aortic valve stenosis.   5. The inferior vena cava is normal in size with <50% respiratory  variability, suggesting right atrial pressure of 8 mmHg.   Comparison(s): No prior Echocardiogram.  LHC 12/2022:   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   Prox RCA lesion is 100% stenosed.   Mid Cx to Dist Cx lesion is 100% stenosed.   Mid LAD-1 lesion is 99% stenosed.   Mid LAD-2 lesion is 99% stenosed.   Balloon angioplasty was performed using a BALLN EMERGE MR 2.0X15.   Balloon angioplasty was performed using a BALLN [place] EMERGE MR 2.5X15.   Post intervention, there is a 90% residual stenosis.   Post intervention, there is a 99% residual stenosis.   Acute anterior STEMI The LAD is a large caliber vessel that courses to the apex. The mid LAD has serial 99% heavily calcified stenoses. The distal LAD has mild non-obstructive disease The Circumflex has 100% chronic occlusion in the mid AV groove segment. The distal AV groove Circumflex fills from left to left collaterals.  The  large dominant RCA has 100% chronic proximal occlusion. The distal RCA fills from right to right and left to right collaterals.  Balloon angioplasty of the mid LAD with inability to full expand the balloon due to heavy calcification. TIMI-3 flow at the conclusion of the case. The patient was chest pain free.    Recommendations: Will admit to ICU. I was unable to fully expand the lesions in the mid LAD but flow down the LAD was improved with balloon angioplasty and with  medical therapy. 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 Will ask CT surgery to see him to discuss bypass surgery. Echo later today. Continue Aggrastat infusion for now. Will continue ASA and start a high intensity statin. (No oral anti-platelet agent was given).   Physical Exam:   VS:  BP 122/70   Pulse 86   Ht 5\' 4"  (1.626 m)   Wt 149 lb (67.6 kg)   SpO2 98%   BMI 25.58 kg/m    Wt Readings from Last 3 Encounters:  06/14/23 149 lb (67.6 kg)  04/01/23 155 lb 3.2 oz (70.4 kg)  02/11/23 158 lb 12.8 oz (72 kg)    GEN: Well nourished, well developed in no acute distress NECK: No JVD; No carotid bruits CARDIAC: S1/S2, RRR, no murmurs, rubs, gallops RESPIRATORY:  Clear to auscultation without rales, wheezing or rhonchi  ABDOMEN: Soft, non-tender, non-distended EXTREMITIES:  No edema; No deformity   ASSESSMENT AND PLAN: .    CAD, s/p CABG x 3 (12/2022), hx of acute STEMI Denies any chest pain/anginal symptoms.  Continue aspirin, atorvastatin, Plavix, and Toprol-XL. Heart healthy diet and regular cardiovascular exercise encouraged.   2. HFmrEF, ICM Stage C, NYHA class I-II symptoms.  Initial echo during recent hospitalization revealed EF 25 to 30%, TEE afterwards revealed improved EF to 40%.  GDMT limited at this time due to patient's BP trends.  Continue current medication regimen. Low sodium diet, fluid restriction <2L, and daily weights encouraged. Educated to contact our office for weight gain of 2 lbs overnight or 5 lbs in one week.  Will update limited echocardiogram at this time to evaluate EF.  3. HTN BP stable. Discussed to monitor BP at home at least 2 hours after medications and sitting for 5-10 minutes. No medication changes at this time. Heart healthy diet and regular cardiovascular exercise encouraged.   4.   Leukocytosis Recent lab work revealed WBC at 14.1, denies any fever,  chills, recent sick contacts.  Blood work also revealed stable hemoglobin and improved Mag level. Continue to follow with PCP for further evaluation.  5. Neuropathy No indication of PAD on exam. Chronic and stable. Recommended to follow-up with PCP for further evaluation.   Dispo: Follow-up with me/APP in 2 to 3 months or sooner if anything changes.   Signed, Sharlene Dory, NP

## 2023-06-16 DIAGNOSIS — E875 Hyperkalemia: Secondary | ICD-10-CM | POA: Diagnosis not present

## 2023-06-16 DIAGNOSIS — D649 Anemia, unspecified: Secondary | ICD-10-CM | POA: Diagnosis not present

## 2023-06-16 DIAGNOSIS — Z13220 Encounter for screening for lipoid disorders: Secondary | ICD-10-CM | POA: Diagnosis not present

## 2023-06-16 DIAGNOSIS — R944 Abnormal results of kidney function studies: Secondary | ICD-10-CM | POA: Diagnosis not present

## 2023-06-16 DIAGNOSIS — I10 Essential (primary) hypertension: Secondary | ICD-10-CM | POA: Diagnosis not present

## 2023-06-16 DIAGNOSIS — R7989 Other specified abnormal findings of blood chemistry: Secondary | ICD-10-CM | POA: Diagnosis not present

## 2023-06-16 DIAGNOSIS — E119 Type 2 diabetes mellitus without complications: Secondary | ICD-10-CM | POA: Diagnosis not present

## 2023-06-23 DIAGNOSIS — E1121 Type 2 diabetes mellitus with diabetic nephropathy: Secondary | ICD-10-CM | POA: Diagnosis not present

## 2023-06-23 DIAGNOSIS — Z6822 Body mass index (BMI) 22.0-22.9, adult: Secondary | ICD-10-CM | POA: Diagnosis not present

## 2023-06-23 DIAGNOSIS — I2510 Atherosclerotic heart disease of native coronary artery without angina pectoris: Secondary | ICD-10-CM | POA: Diagnosis not present

## 2023-06-23 DIAGNOSIS — I10 Essential (primary) hypertension: Secondary | ICD-10-CM | POA: Diagnosis not present

## 2023-06-23 DIAGNOSIS — M109 Gout, unspecified: Secondary | ICD-10-CM | POA: Diagnosis not present

## 2023-06-29 ENCOUNTER — Other Ambulatory Visit: Payer: Self-pay | Admitting: Nurse Practitioner

## 2023-07-04 ENCOUNTER — Ambulatory Visit: Attending: Nurse Practitioner

## 2023-07-04 DIAGNOSIS — I255 Ischemic cardiomyopathy: Secondary | ICD-10-CM

## 2023-07-04 LAB — ECHOCARDIOGRAM LIMITED
Area-P 1/2: 2.64 cm2
Calc EF: 62.2 %
S' Lateral: 3.1 cm
Single Plane A2C EF: 69.9 %
Single Plane A4C EF: 54.5 %

## 2023-08-05 ENCOUNTER — Telehealth: Payer: Self-pay | Admitting: Nurse Practitioner

## 2023-08-05 NOTE — Telephone Encounter (Signed)
   Pt c/o of Chest Pain: STAT if active CP, including tightness, pressure, jaw pain, radiating pain to shoulder/upper arm/back, CP unrelieved by Nitro. Symptoms reported of SOB, nausea, vomiting, sweating.  1. Are you having CP right now? yes   2. Are you experiencing any other symptoms (ex. SOB, nausea, vomiting, sweating)? no   3. Is your CP continuous or coming and going? Comes and goes   4. Have you taken Nitroglycerin ? no   5. How long have you been experiencing CP? 3-4 days    6. If NO CP at time of call then end call with telling Pt to call back or call 911 if Chest pain returns prior to return call from triage team. no

## 2023-08-05 NOTE — Telephone Encounter (Signed)
 Spoke with patient at time of call - states he is not currently having active chest pain now.  Has had recurrent chest pain over the last 3-4 days.  One of which he felt very sick like he may pass out but did not.  Does not have NTG.  Patient requested next available with provider as opposed to going to the ED.  Appointment given for 08/12/23 with Lasalle Pointer, NP in [place] office.  In the meantime, strongly suggested ED for evaluation if symptoms persist / worsen.  He verbalized understanding.

## 2023-08-08 MED ORDER — NITROGLYCERIN 0.4 MG SL SUBL: 0.4000 mg | SUBLINGUAL_TABLET | SUBLINGUAL | 1 refills | Status: AC | PRN

## 2023-08-08 NOTE — Telephone Encounter (Signed)
 Patient informed and verbalized understanding of plan.

## 2023-08-09 ENCOUNTER — Emergency Department (HOSPITAL_COMMUNITY)

## 2023-08-09 ENCOUNTER — Emergency Department (HOSPITAL_COMMUNITY)
Admission: EM | Admit: 2023-08-09 | Discharge: 2023-08-09 | Disposition: A | Discharge status: Home / Self Care | Attending: Emergency Medicine | Admitting: Emergency Medicine

## 2023-08-09 ENCOUNTER — Other Ambulatory Visit: Payer: Self-pay

## 2023-08-09 ENCOUNTER — Encounter (HOSPITAL_COMMUNITY): Payer: Self-pay

## 2023-08-09 DIAGNOSIS — I10 Essential (primary) hypertension: Secondary | ICD-10-CM | POA: Insufficient documentation

## 2023-08-09 DIAGNOSIS — Z951 Presence of aortocoronary bypass graft: Secondary | ICD-10-CM | POA: Diagnosis not present

## 2023-08-09 DIAGNOSIS — I2510 Atherosclerotic heart disease of native coronary artery without angina pectoris: Secondary | ICD-10-CM | POA: Insufficient documentation

## 2023-08-09 DIAGNOSIS — K838 Other specified diseases of biliary tract: Secondary | ICD-10-CM | POA: Diagnosis not present

## 2023-08-09 DIAGNOSIS — R1010 Upper abdominal pain, unspecified: Secondary | ICD-10-CM | POA: Diagnosis present

## 2023-08-09 DIAGNOSIS — Z7902 Long term (current) use of antithrombotics/antiplatelets: Secondary | ICD-10-CM | POA: Insufficient documentation

## 2023-08-09 DIAGNOSIS — R748 Abnormal levels of other serum enzymes: Secondary | ICD-10-CM | POA: Insufficient documentation

## 2023-08-09 DIAGNOSIS — Z79899 Other long term (current) drug therapy: Secondary | ICD-10-CM | POA: Insufficient documentation

## 2023-08-09 DIAGNOSIS — K828 Other specified diseases of gallbladder: Secondary | ICD-10-CM | POA: Diagnosis not present

## 2023-08-09 DIAGNOSIS — K449 Diaphragmatic hernia without obstruction or gangrene: Secondary | ICD-10-CM | POA: Diagnosis not present

## 2023-08-09 DIAGNOSIS — K8020 Calculus of gallbladder without cholecystitis without obstruction: Secondary | ICD-10-CM

## 2023-08-09 DIAGNOSIS — E119 Type 2 diabetes mellitus without complications: Secondary | ICD-10-CM | POA: Diagnosis not present

## 2023-08-09 DIAGNOSIS — R079 Chest pain, unspecified: Secondary | ICD-10-CM | POA: Diagnosis not present

## 2023-08-09 DIAGNOSIS — R0789 Other chest pain: Secondary | ICD-10-CM | POA: Diagnosis not present

## 2023-08-09 DIAGNOSIS — Z7984 Long term (current) use of oral hypoglycemic drugs: Secondary | ICD-10-CM | POA: Diagnosis not present

## 2023-08-09 DIAGNOSIS — R918 Other nonspecific abnormal finding of lung field: Secondary | ICD-10-CM | POA: Diagnosis not present

## 2023-08-09 DIAGNOSIS — R1011 Right upper quadrant pain: Secondary | ICD-10-CM | POA: Diagnosis not present

## 2023-08-09 DIAGNOSIS — R161 Splenomegaly, not elsewhere classified: Secondary | ICD-10-CM | POA: Diagnosis not present

## 2023-08-09 DIAGNOSIS — Z7982 Long term (current) use of aspirin: Secondary | ICD-10-CM | POA: Diagnosis not present

## 2023-08-09 DIAGNOSIS — K5730 Diverticulosis of large intestine without perforation or abscess without bleeding: Secondary | ICD-10-CM | POA: Diagnosis not present

## 2023-08-09 LAB — COMPREHENSIVE METABOLIC PANEL WITH GFR
ALT: 33 U/L (ref 0–44)
AST: 30 U/L (ref 15–41)
Albumin: 3.3 g/dL — ABNORMAL LOW (ref 3.5–5.0)
Alkaline Phosphatase: 255 U/L — ABNORMAL HIGH (ref 38–126)
Anion gap: 10 (ref 5–15)
BUN: 15 mg/dL (ref 8–23)
CO2: 26 mmol/L (ref 22–32)
Calcium: 9.5 mg/dL (ref 8.9–10.3)
Chloride: 99 mmol/L (ref 98–111)
Creatinine, Ser: 0.81 mg/dL (ref 0.61–1.24)
GFR, Estimated: >60 mL/min (ref >60)
Glucose, Bld: 133 mg/dL — ABNORMAL HIGH (ref 70–99)
Potassium: 3.8 mmol/L (ref 3.5–5.1)
Sodium: 135 mmol/L (ref 135–145)
Total Bilirubin: 0.8 mg/dL (ref 0.0–1.2)
Total Protein: 7.1 g/dL (ref 6.5–8.1)

## 2023-08-09 LAB — CBC WITH DIFFERENTIAL/PLATELET
Abs Immature Granulocytes: 0.03 10*3/uL (ref 0.00–0.07)
Basophils Absolute: 0 10*3/uL (ref 0.0–0.1)
Basophils Relative: 0 %
Eosinophils Absolute: 0.3 10*3/uL (ref 0.0–0.5)
Eosinophils Relative: 3 %
HCT: 40.1 % (ref 39.0–52.0)
Hemoglobin: 12.2 g/dL — ABNORMAL LOW (ref 13.0–17.0)
Immature Granulocytes: 0 %
Lymphocytes Relative: 13 %
Lymphs Abs: 1.3 10*3/uL (ref 0.7–4.0)
MCH: 27.6 pg (ref 26.0–34.0)
MCHC: 30.4 g/dL (ref 30.0–36.0)
MCV: 90.7 fL (ref 80.0–100.0)
Monocytes Absolute: 0.8 10*3/uL (ref 0.1–1.0)
Monocytes Relative: 8 %
Neutro Abs: 7.6 10*3/uL (ref 1.7–7.7)
Neutrophils Relative %: 76 %
Platelets: 243 10*3/uL (ref 150–400)
RBC: 4.42 MIL/uL (ref 4.22–5.81)
RDW: 16.9 % — ABNORMAL HIGH (ref 11.5–15.5)
WBC: 9.9 10*3/uL (ref 4.0–10.5)
nRBC: 0 % (ref 0.0–0.2)

## 2023-08-09 LAB — TROPONIN I (HIGH SENSITIVITY): Troponin I (High Sensitivity): 8 ng/L (ref <18)

## 2023-08-09 LAB — LIPASE, BLOOD: Lipase: 31 U/L (ref 11–51)

## 2023-08-09 MED ORDER — PIPERACILLIN-TAZOBACTAM 3.375 G IVPB 30 MIN
3.3750 g | Freq: Once | INTRAVENOUS | Status: AC
  Administered 2023-08-09: 3.375 g via INTRAVENOUS
  Filled 2023-08-09: qty 50

## 2023-08-09 MED ORDER — AMOXICILLIN-POT CLAVULANATE 875-125 MG PO TABS: 1.0000 | ORAL_TABLET | Freq: Two times a day (BID) | ORAL | 0 refills | Status: DC

## 2023-08-09 MED ORDER — IOHEXOL 300 MG/ML  SOLN
100.0000 mL | Freq: Once | INTRAMUSCULAR | Status: AC | PRN
Start: 2023-08-09 — End: 2023-08-09
  Administered 2023-08-09: 100 mL via INTRAVENOUS

## 2023-08-09 MED ORDER — GADOBUTROL 1 MMOL/ML IV SOLN
7.0000 mL | Freq: Once | INTRAVENOUS | Status: AC | PRN
  Administered 2023-08-09: 7 mL via INTRAVENOUS

## 2023-08-09 NOTE — ED Provider Notes (Signed)
 Seen by general surgery Dr. Larrie Po.  Since patient is feeling well without any lab abnormalities He will order an MRCP to evaluate for any choledocholithiasis If there is no evidence of this, patient may be discharged per Dr. Larrie Po I went ahead and sent in a prescription for antibiotics if he is home-going   Eldon Greenland, MD 08/09/23 1317

## 2023-08-09 NOTE — ED Provider Notes (Signed)
 [facility] AT [facility] Provider Note   CSN: [id number] Arrival date & time: 08/09/23  0827     History  Chief Complaint  Patient presents with   Chest Pain    Terry White is a 83 y.o. male.  The history is provided by the patient.  Patient w/history of diabetes, hypertension, previous MI with CABG presents with upper abdominal pain. Patient reports about a week ago he was traveling to [place] when he had severe onset of upper abdominal pain that lasted about a minute.  He reports it is so severe he had to double over in pain.  It did not radiate to the chest or back.  He became diaphoretic but no vomiting.  It was not associated with eating, and it resolved spontaneously About 4 days later the pain returned but was not as severe and no vomiting.  He had an additional episode of pain yesterday, but is currently pain-free.  He reports during the third episode the upper abdominal pain did move into his chest briefly.  No back pain.  No focal weakness.  No shortness of breath at this time denies previous abdominal surgery    Past Medical History:  Diagnosis Date   Diabetes mellitus without complication (HCC)    Gout    HTN (hypertension)     Home Medications Prior to Admission medications   Medication Sig Start Date End Date Taking? Authorizing Provider  gabapentin (NEURONTIN) 100 MG capsule Take 100 mg by mouth 3 (three) times daily. 06/23/23  Yes [provider]  allopurinol  (ZYLOPRIM ) 300 MG tablet Take 300 mg by mouth at bedtime.    [provider]  aspirin  81 MG chewable tablet Chew 1 tablet (81 mg total) by mouth daily. 02/11/23   Lasalle Pointer, NP  atorvastatin  (LIPITOR) 80 MG tablet TAKE 1 TABLET EVERY DAY 06/02/23   Lasalle Pointer, NP  cetirizine (ZYRTEC) 10 MG tablet Take 10 mg by mouth daily. 05/16/23   [provider]  clopidogrel  (PLAVIX ) 75 MG tablet Take 1 tablet (75 mg total) by mouth daily. 04/01/23    Lasalle Pointer, NP  empagliflozin  (JARDIANCE ) 10 MG TABS tablet TAKE 1 TABLET EVERY DAY 06/29/23   Lasalle Pointer, NP  Magnesium  Oxide 400 MG CAPS Take 1 capsule (400 mg total) by mouth daily. 04/01/23   Lasalle Pointer, NP  metFORMIN  (GLUCOPHAGE -XR) 500 MG 24 hr tablet Take 500 mg by mouth at bedtime.    [provider]  metoprolol  succinate (TOPROL -XL) 25 MG 24 hr tablet TAKE 1 TABLET EVERY DAY 06/29/23   Lasalle Pointer, NP  nitroGLYCERIN  (NITROSTAT ) 0.4 MG SL tablet Place 1 tablet (0.4 mg total) under the tongue every 5 (five) minutes x 3 doses as needed for chest pain (if no relief after 2nd dose, proceed to ED or call 911). 08/08/23   Lasalle Pointer, NP  Omega-3 Fatty Acids (FISH OIL PO) Take 1 capsule by mouth at bedtime.    [provider]  tamsulosin  (FLOMAX ) 0.4 MG CAPS capsule Take 1 capsule (0.4 mg total) by mouth daily. 12/28/22   Roddenberry, Myron G, PA-C      Allergies    Patient has no known allergies.    Review of Systems   Review of Systems  Constitutional:  Positive for diaphoresis. Negative for fever.  Gastrointestinal:  Positive for nausea. Negative for blood in stool and vomiting.  Genitourinary:  Negative for flank pain.  Musculoskeletal:  Negative for back pain.  Physical Exam Updated Vital Signs BP 105/61   Pulse 64   Temp 97.8 F (36.6 C) (Oral)   Resp 12   Ht 1.765 m (5' 9.5")   Wt 63.5 kg   SpO2 96%   BMI 20.38 kg/m  Physical Exam CONSTITUTIONAL: Elderly but overall well-appearing HEAD: Normocephalic/atraumatic EYES: EOMI/PERRL, no icterus ENMT: Mucous membranes moist NECK: supple no meningeal signs CV: S1/S2 noted LUNGS: Lungs are clear to auscultation bilaterally, no apparent distress ABDOMEN: soft, moderate epigastric tenderness, no rebound or guarding, bowel sounds noted throughout abdomen GU:no cva tenderness NEURO: Pt is awake/alert/appropriate, moves all extremitiesx4.  No facial droop.   EXTREMITIES: pulses  normal/equalx4, full ROM SKIN: warm, color normal Sternotomy scar noted, it is well-healed PSYCH: no abnormalities of mood noted, alert and oriented to situation  ED Results / Procedures / Treatments   Labs (all labs ordered are listed, but only abnormal results are displayed) Labs Reviewed  CBC WITH DIFFERENTIAL/PLATELET - Abnormal; Notable for the following components:      Result Value   Hemoglobin 12.2 (*)    RDW 16.9 (*)    All other components within normal limits  COMPREHENSIVE METABOLIC PANEL WITH GFR - Abnormal; Notable for the following components:   Glucose, Bld 133 (*)    Albumin  3.3 (*)    Alkaline Phosphatase 255 (*)    All other components within normal limits  LIPASE, BLOOD  TROPONIN I (HIGH SENSITIVITY)    EKG EKG Interpretation Date/Time:  Tuesday Aug 09 2023 08:38:30 EDT Ventricular Rate:  73 PR Interval:  173 QRS Duration:  86 QT Interval:  394 QTC Calculation: 435 R Axis:   81  Text Interpretation: Sinus rhythm Borderline right axis deviation Low voltage, precordial leads Confirmed by Eldon Greenland ([id number]) on 08/09/2023 8:50:27 AM  Radiology CT ABDOMEN PELVIS W CONTRAST Result Date: 08/09/2023 CLINICAL DATA:  Acute abdominal pain, possible pneumobilia on ultrasound, cholelithiasis. EXAM: CT ABDOMEN AND PELVIS WITH CONTRAST TECHNIQUE: Multidetector CT imaging of the abdomen and pelvis was performed using the standard protocol following bolus administration of intravenous contrast. RADIATION DOSE REDUCTION: This exam was performed according to the departmental dose-optimization program which includes automated exposure control, adjustment of the mA and/or kV according to patient size and/or use of iterative reconstruction technique. CONTRAST:  OMNIPAQUE  IOHEXOL  300 MG/ML  SOLN COMPARISON:  Abdominal ultrasound 08/09/2023 and PET-CT 12/14/2016 FINDINGS: Lower chest: Left anterior descending, right, and circumflex coronary artery atherosclerosis along with  descending thoracic aortic atherosclerosis. Hiatal hernia noted, mild distal esophageal wall thickening potentially from esophagitis. Emphysema. Scarring in the right middle lobe and right lower lobe. Prior median sternotomy. Hepatobiliary: Large gallstone measuring 5.4 cm in long axis with internal nitrogen gas phenomenon. There is gas in the gallbladder lumen along with suspected mild gallbladder wall thickening. Intrahepatic pneumobilia along with pneumobilia tracking in the common hepatic duct and distally in the common bile duct. Common bile duct is dilated at 1.2 cm, and there is mucosal enhancement in the duodenal bulb and descending duodenum which may be secondary to adjacent gallbladder inflammation. No definite gas in the gallbladder wall itself. The intrahepatic biliary tree is only borderline dilated. No focal hepatic parenchymal lesion. Pancreas: Unremarkable Spleen: The spleen measures 15.7 by 6.5 by 11.0 cm (volume = 590 cm^3), compatible with splenomegaly. Adrenals/Urinary Tract: Benign Bosniak category 1 and category 2 cysts observed in the right kidney. Benign left renal cysts. No further imaging workup of these lesions is indicated. Adrenal glands unremarkable.  Urinary bladder unremarkable.  Stomach/Bowel: No dilated small bowel. Accentuated mucosal enhancement in the first and second portions the duodenum, likely inflammatory. Chronic periampullary duodenal diverticulum noted. I do not see an obvious focal hyperdensity in the bowel to indicate a calcified passed gallstone, and no dilated bowel to further indicate gallstone ileus. Descending and sigmoid colon diverticulosis. Vascular/Lymphatic: Atherosclerosis is present, including aortoiliac atherosclerotic disease. Reactive porta hepatis lymph nodes. Reproductive: Prostatomegaly. Other: Trace free pelvic fluid. Musculoskeletal: Nonunited right posterior tenth rib fracture. 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 IMPRESSION: 1. Pneumobilia, gas in the gallbladder, large gallstone in the gallbladder, mild gallbladder wall thickening with likely secondary inflammation of the proximal duodenum, and moderate extrahepatic biliary dilatation. Cannot exclude distal choledocholithiasis or acute cholecystitis. No dilated bowel or obvious calcified gallstone within the bowel to indicate gallstone ileus. Top possibilities include incompetent sphincter of Oddi following passage of a gallstone, or occult spontaneous biliary enteric fistula. Cholangitis is a less likely differential diagnostic consideration. 2. Hiatal hernia with mild distal esophageal wall thickening potentially from esophagitis. 3. Prostatomegaly. 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 5. Aortic and coronary artery atherosclerosis. Electronically Signed   By: Freida Jes M.D.   On: 08/09/2023 11:56   US  Abdomen Limited Result Date: 08/09/2023 CLINICAL DATA:  Right upper quadrant abdominal pain. EXAM: ULTRASOUND ABDOMEN LIMITED RIGHT UPPER QUADRANT COMPARISON:  None Available. FINDINGS: Gallbladder: Multiple gallstones are noted as well as sludge. Mild gallbladder wall thickening is noted at 4 mm. The presence or absence of sonographic Murphy's sign was not reported by sonographer. Common bile duct: Diameter: 8 mm which is abnormally dilated Liver: Echogenic foci are noted in some biliary ducts suggesting possible pneumobilia. Portal vein is patent on color Doppler imaging with normal direction of blood flow towards the liver. Other: None. IMPRESSION: Cholelithiasis and sludge is noted. Mild gallbladder wall thickening is noted suggesting possible cholecystitis. Common bile duct dilatation is noted at 8 mm concerning for distal common bile duct obstruction. Echogenic foci are noted in some biliary ducts suggesting possible pneumobilia. CT scan of the abdomen is recommended for further  evaluation. Electronically Signed   By: Rosalene Colon M.D.   On: 08/09/2023 10:16   DG Chest Portable 1 View Result Date: 08/09/2023 CLINICAL DATA:  Chest pain. EXAM: PORTABLE CHEST 1 VIEW COMPARISON:  January 06, 2023. FINDINGS: The heart size and mediastinal contours are within normal limits. Sternotomy wires are noted. Stable right upper lobe scarring is noted. Minimally increased right basilar opacity is noted concerning for atelectasis or infiltrate. Left lung is clear. The visualized skeletal structures are unremarkable. IMPRESSION: Minimally increased right basilar opacity is noted concerning for atelectasis or possibly infiltrate. Followup PA and lateral chest X-ray is recommended in 3-4 weeks following trial of antibiotic therapy to ensure resolution and exclude underlying malignancy. Electronically Signed   By: Rosalene Colon M.D.   On: 08/09/2023 09:44    Procedures Procedures    Medications Ordered in ED Medications  piperacillin-tazobactam (ZOSYN) IVPB 3.375 g (3.375 g Intravenous New Bag/Given 08/09/23 1235)  iohexol  (OMNIPAQUE ) 300 MG/ML solution 100 mL (100 mLs Intravenous Contrast Given 08/09/23 1107)    ED Course/ Medical Decision Making/ A&P Clinical Course as of 08/09/23 1238  Tue Aug 09, 2023  0942 Patient presents with episodes of epigastric abdominal pain that are severe but limited.  He is now pain-free.  Did have brief episode of chest  pain with a previous history of CABG However he does have focal abdominal tenderness.  Will start with abdominal ultrasound [DW]  1121 Patient still pain-free, however will obtain CT imaging per ultrasound results [DW]  1121 Alkaline Phosphatase(!): 255 Mildly elevated alkaline phosphatase [DW]  1221 Discussed CT findings with Dr. Larrie Po with surgery Will see the patient.  Will likely need hospitalist admission, will call report.  IV antibiotics been ordered [DW]  1233 D/w dr Mason Sole with [facility] who will evaluate for admission [DW]     Clinical Course User Index [DW] Eldon Greenland, MD                                 Medical Decision Making Amount and/or Complexity of Data Reviewed Labs: ordered. Decision-making details documented in ED Course. Radiology: ordered.  Risk Prescription drug management. Decision regarding hospitalization.   This patient presents to the ED for concern of abdominal pain, this involves an extensive number of treatment options, and is a complaint that carries with it a high risk of complications and morbidity.  The differential diagnosis includes but is not limited to cholecystitis, cholelithiasis, pancreatitis, gastritis, peptic ulcer disease, appendicitis, bowel obstruction, bowel perforation, diverticulitis, AAA, ischemic bowel, acute coronary syndrome    Comorbidities that complicate the patient evaluation: Patient's presentation is complicated by their history of CAD  Additional history obtained: Additional history obtained from family Records reviewed  cardiology notes reviewed  Lab Tests: I Ordered, and personally interpreted labs.  The pertinent results include: Labs are largely reassuring  Imaging Studies ordered: I ordered imaging studies including X-ray chest abdominal ultrasound I independently visualized and interpreted imaging which showed cholelithiasis I agree with the radiologist interpretation  Cardiac Monitoring: The patient was maintained on a cardiac monitor.  I personally viewed and interpreted the cardiac monitor which showed an underlying rhythm of:  sinus rhythm  Medicines ordered and prescription drug management: Patient declines pain medicines   Critical Interventions:   evaluation for admission  Consultations Obtained: I requested consultation with the admitting physician [facility] and consultant General Surgery , and discussed  findings as well as pertinent plan - they recommend: Evaluate for admission  Reevaluation: After the interventions noted  above, I reevaluated the patient and found that they have :stayed the same  Complexity of problems addressed: Patient's presentation is most consistent with  acute presentation with potential threat to life or bodily function  Disposition: After consideration of the diagnostic results and the patient's response to treatment,  I feel that the patent would benefit from admission   .           Final Clinical Impression(s) / ED Diagnoses Final diagnoses:  Calculus of gallbladder without cholecystitis without obstruction    Rx / DC Orders ED Discharge Orders     None         Eldon Greenland, MD 08/09/23 1239

## 2023-08-09 NOTE — Discharge Instructions (Addendum)
 Hold plavix for now.

## 2023-08-09 NOTE — ED Provider Notes (Signed)
 Patient back from MRI and patient requested discharge.  He is sitting up with his cane and looks well  Discussed with Dr. Larrie Po with general surgery.  He will be arranging close follow-up.  He is instructed patient to hold his Plavix  in anticipation of cholecystectomy.  Patient will be placed on Augmentin.  Discussed strict ER return precautions with patient & son   Eldon Greenland, MD 08/09/23 1441

## 2023-08-09 NOTE — Consult Note (Signed)
 Reason for Consult: Cholelithiasis, pneumobilia Referring Physician: Dr. Humphrey Magnuson White is an 83 y.o. male.  HPI: Patient is an 83 year old white male status post CABG in 2024 who presents with a 1 week history of intermittent and worsening right upper quadrant abdominal pain and nausea.  It started 1 week ago while he was traveling.  It returned yesterday but was not as severe.  He had an additional episode earlier today.  He currently is not having any significant abdominal pain, nausea, or vomiting.  CT scan of the abdomen was performed which revealed a dilated hepatobiliary tree with pneumobilia as well as a very large gallstone.  Ultrasound of the right upper quadrant confirmed these findings.  Interestingly, the patient's liver enzyme tests as well as white blood cell count are all within normal limits.  He has never had episodes like this before.  He denies any fever, chills, or jaundice.  He is on Plavix .  Past Medical History:  Diagnosis Date   Diabetes mellitus without complication (HCC)    Gout    HTN (hypertension)     Past Surgical History:  Procedure Laterality Date   CORONARY ARTERY BYPASS GRAFT N/A 12/22/2022   Procedure: CORONARY ARTERY BYPASS GRAFTING (CABG)XTHREE, USING LEFT INTERNAL MAMMARY ARTERY AND RIGHT LEG GREATER SAPHENEOUS VEIN HARVESTED ENDOSCOPICLY;  Surgeon: Terry Sportsman, MD;  Location: [facility] OR;  Service: Open Heart Surgery;  Laterality: N/A;   CORONARY/GRAFT ACUTE MI REVASCULARIZATION N/A 12/16/2022   Procedure: Coronary/Graft Acute MI Revascularization;  Surgeon: Terry Benne, MD;  Location: [facility] INVASIVE CV LAB;  Service: Cardiovascular;  Laterality: N/A;   LEFT HEART CATH AND CORONARY ANGIOGRAPHY N/A 12/16/2022   Procedure: LEFT HEART CATH AND CORONARY ANGIOGRAPHY;  Surgeon: Terry Benne, MD;  Location: [facility] INVAS[facility] CV [facility];  Service: Cardiovascular;  Laterality: N/A;   TEE WITHOUT CARDIOVERSION N/A 12/22/2022   Procedure: TRANSESOPHAGEAL  ECHOCARDIOGRAM;  Surgeon: Terry Sportsman, MD;  Location: [facility] OR;  Service: Open Heart Surgery;  Laterality: N/A;    History reviewed. No pertinent family history.  Social History:  reports that he quit smoking about 17 years ago. His smoking use included cigarettes. He has never used smokeless tobacco. He reports that he does not drink alcohol  and does not use drugs.  Allergies: No Known Allergies  Medications: Prior to Admission: (Not in a hospital admission)   Results for orders placed or performed during the hospital encounter of 08/09/23 (from the past 48 hours)  CBC with Differential     Status: Abnormal   Collection Time: 08/09/23  9:17 AM  Result Value Ref Range   WBC 9.9 4.0 - 10.5 K/uL   RBC 4.42 4.22 - 5.81 MIL/uL   Hemoglobin 12.2 (L) 13.0 - 17.0 g/dL   HCT 82.9 56.2 - 13.0 %   MCV 90.7 80.0 - 100.0 fL   MCH 27.6 26.0 - 34.0 pg   MCHC 30.4 30.0 - 36.0 g/dL   RDW 86.5 (H) 78.4 - 69.6 %   Platelets 243 150 - 400 K/uL   nRBC 0.0 0.0 - 0.2 %   Neutrophils Relative % 76 %   Neutro Abs 7.6 1.7 - 7.7 K/uL   Lymphocytes Relative 13 %   Lymphs Abs 1.3 0.7 - 4.0 K/uL   Monocytes Relative 8 %   Monocytes Absolute 0.8 0.1 - 1.0 K/uL   Eosinophils Relative 3 %   Eosinophils Absolute 0.3 0.0 - 0.5 K/uL   Basophils Relative 0 %   Basophils Absolute 0.0 0.0 -  0.1 K/uL   Immature Granulocytes 0 %   Abs Immature Granulocytes 0.03 0.00 - 0.07 K/uL    Comment: Performed at [facility], [address] [place], [place] [address]  Comprehensive metabolic panel     Status: Abnormal   Collection Time: 08/09/23  9:17 AM  Result Value Ref Range   Sodium 135 135 - 145 mmol/L   Potassium 3.8 3.5 - 5.1 mmol/L   Chloride 99 98 - 111 mmol/L   CO2 26 22 - 32 mmol/L   Glucose, Bld 133 (H) 70 - 99 mg/dL    Comment: Glucose reference range applies only to samples taken after fasting for at least 8 hours.   BUN 15 8 - 23 mg/dL   Creatinine, Ser 6.04 0.61 - 1.24 mg/dL   Calcium  9.5 8.9 - 10.3  mg/dL   Total Protein 7.1 6.5 - 8.1 g/dL   Albumin  3.3 (L) 3.5 - 5.0 g/dL   AST 30 15 - 41 U/L   ALT 33 0 - 44 U/L   Alkaline Phosphatase 255 (H) 38 - 126 U/L   Total Bilirubin 0.8 0.0 - 1.2 mg/dL   GFR, Estimated >54 >09 mL/min    Comment: (NOTE) Calculated using the CKD-EPI Creatinine Equation (2021)    Anion gap 10 5 - 15    Comment: Performed at [facility], [address], [place], [place] [address]  Lipase, blood     Status: None   Collection Time: 08/09/23  9:17 AM  Result Value Ref Range   Lipase 31 11 - 51 U/L    Comment: Performed at [facility], [address], [place], [place] [address]  Troponin I (High Sensitivity)     Status: None   Collection Time: 08/09/23  9:17 AM  Result Value Ref Range   Troponin I (High Sensitivity) 8 <18 ng/L    Comment: (NOTE) Elevated high sensitivity troponin I (hsTnI) values and significant  changes across serial measurements may suggest ACS but many other  chronic and acute conditions are known to elevate hsTnI results.  Refer to the "Links" section for chest pain algorithms and additional  guidance. Performed at [facility], [address], [place], [place] [address]     CT ABDOMEN PELVIS W CONTRAST Result Date: 08/09/2023 CLINICAL DATA:  Acute abdominal pain, possible pneumobilia on ultrasound, cholelithiasis. EXAM: CT ABDOMEN AND PELVIS WITH CONTRAST TECHNIQUE: Multidetector CT imaging of the abdomen and pelvis was performed using the standard protocol following bolus administration of intravenous contrast. RADIATION DOSE REDUCTION: This exam was performed according to the departmental dose-optimization program which includes automated exposure control, adjustment of the mA and/or kV according to patient size and/or use of iterative reconstruction technique. CONTRAST:  OMNIPAQUE  IOHEXOL  300 MG/ML  SOLN COMPARISON:  Abdominal ultrasound 08/09/2023 and PET-CT 12/14/2016 FINDINGS: Lower chest: Left anterior descending, right, and  circumflex coronary artery atherosclerosis along with descending thoracic aortic atherosclerosis. Hiatal hernia noted, mild distal esophageal wall thickening potentially from esophagitis. Emphysema. Scarring in the right middle lobe and right lower lobe. Prior median sternotomy. Hepatobiliary: Large gallstone measuring 5.4 cm in long axis with internal nitrogen gas phenomenon. There is gas in the gallbladder lumen along with suspected mild gallbladder wall thickening. Intrahepatic pneumobilia along with pneumobilia tracking in the common hepatic duct and distally in the common bile duct. Common bile duct is dilated at 1.2 cm, and there is mucosal enhancement in the duodenal bulb and descending duodenum which may be secondary to adjacent gallbladder inflammation. No definite  gas in the gallbladder wall itself. The intrahepatic biliary tree is only borderline dilated. No focal hepatic parenchymal lesion. Pancreas: Unremarkable Spleen: The spleen measures 15.7 by 6.5 by 11.0 cm (volume = 590 cm^3), compatible with splenomegaly. Adrenals/Urinary Tract: Benign Bosniak category 1 and category 2 cysts observed in the right kidney. Benign left renal cysts. No further imaging workup of these lesions is indicated. Adrenal glands unremarkable.  Urinary bladder unremarkable. Stomach/Bowel: No dilated small bowel. Accentuated mucosal enhancement in the first and second portions the duodenum, likely inflammatory. Chronic periampullary duodenal diverticulum noted. I do not see an obvious focal hyperdensity in the bowel to indicate a calcified passed gallstone, and no dilated bowel to further indicate gallstone ileus. Descending and sigmoid colon diverticulosis. Vascular/Lymphatic: Atherosclerosis is present, including aortoiliac atherosclerotic disease. Reactive porta hepatis lymph nodes. Reproductive: Prostatomegaly. Other: Trace free pelvic fluid. Musculoskeletal: Nonunited right posterior tenth rib fracture. 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 IMPRESSION: 1. Pneumobilia, gas in the gallbladder, large gallstone in the gallbladder, mild gallbladder wall thickening with likely secondary inflammation of the proximal duodenum, and moderate extrahepatic biliary dilatation. Cannot exclude distal choledocholithiasis or acute cholecystitis. No dilated bowel or obvious calcified gallstone within the bowel to indicate gallstone ileus. Top possibilities include incompetent sphincter of Oddi following passage of a gallstone, or occult spontaneous biliary enteric fistula. Cholangitis is a less likely differential diagnostic consideration. 2. Hiatal hernia with mild distal esophageal wall thickening potentially from esophagitis. 3. Prostatomegaly. 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 5. Aortic and coronary artery atherosclerosis. Electronically Signed   By: Terry White M.D.   On: 08/09/2023 11:56   US  Abdomen Limited Result Date: 08/09/2023 CLINICAL DATA:  Right upper quadrant abdominal pain. EXAM: ULTRASOUND ABDOMEN LIMITED RIGHT UPPER QUADRANT COMPARISON:  None Available. FINDINGS: Gallbladder: Multiple gallstones are noted as well as sludge. Mild gallbladder wall thickening is noted at 4 mm. The presence or absence of sonographic Murphy's sign was not reported by sonographer. Common bile duct: Diameter: 8 mm which is abnormally dilated Liver: Echogenic foci are noted in some biliary ducts suggesting possible pneumobilia. Portal vein is patent on color Doppler imaging with normal direction of blood flow towards the liver. Other: None. IMPRESSION: Cholelithiasis and sludge is noted. Mild gallbladder wall thickening is noted suggesting possible cholecystitis. Common bile duct dilatation is noted at 8 mm concerning for distal common bile duct obstruction. Echogenic foci are noted in some biliary ducts suggesting possible pneumobilia. CT scan  of the abdomen is recommended for further evaluation. Electronically Signed   By: Rosalene Colon M.D.   On: 08/09/2023 10:16   DG Chest Portable 1 View Result Date: 08/09/2023 CLINICAL DATA:  Chest pain. EXAM: PORTABLE CHEST 1 VIEW COMPARISON:  January 06, 2023. FINDINGS: The heart size and mediastinal contours are within normal limits. Sternotomy wires are noted. Stable right upper lobe scarring is noted. Minimally increased right basilar opacity is noted concerning for atelectasis or infiltrate. Left lung is clear. The visualized skeletal structures are unremarkable. IMPRESSION: Minimally increased right basilar opacity is noted concerning for atelectasis or possibly infiltrate. Followup PA and lateral chest X-ray is recommended in 3-4 weeks following trial of antibiotic therapy to ensure resolution and exclude underlying malignancy. Electronically Signed   By: Rosalene Colon M.D.   On: 08/09/2023 09:44    ROS:  Pertinent items are noted in HPI.  Blood pressure 105/61,  pulse 64, temperature 97.8 F (36.6 C), temperature source Oral, resp. rate 12, height 5' 9.5" (1.765 m), weight 63.5 kg, SpO2 96%. Physical Exam: Pleasant white male in no acute distress Head is normocephalic, atraumatic Eyes are without scleral icterus Lungs clear to auscultation with equal breath sounds bilaterally Heart examination reveals a regular rate and rhythm without S3, S4, murmurs Abdomen is soft, nontender, nondistended.  No rigidity is noted.  CT scan images personally reviewed  Assessment/Plan: Impression: Cholelithiasis with pneumobilia and a dilated common bile duct.  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  He is otherwise asymptomatic at the present time. Plan: Will get MRCP to further evaluate hepatobiliary tree.  These findings will determine the course of surgical therapy.  As he has been on Plavix , will delay surgical intervention.  May be discharged home after MRCP and will follow-up in my  office next week.  He should hold his Plavix  at the present time.  Terry White 08/09/2023, 1:13 PM

## 2023-08-09 NOTE — ED Triage Notes (Signed)
 Patient come in POV with complaint of chest pain, Describes the pain "like someone hitting him in the stomach." Stated the pain started Sunday, come and goes.

## 2023-08-10 ENCOUNTER — Telehealth: Payer: Self-pay | Admitting: *Deleted

## 2023-08-10 DIAGNOSIS — K823 Fistula of gallbladder: Secondary | ICD-10-CM

## 2023-08-10 NOTE — Telephone Encounter (Signed)
 Received new orders from Dr. 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  [facility] - [facility] - 5th Floor [place], [place] [address] [phone] telephone [phone] fax  Appointment scheduled: Brendalyn Calkins, MD 08/11/2023 @ 11:30 AM  Call placed to patient and patient made aware.   Of note, patient is still holding Plavix  per recommendation from Dr. Larrie Po.

## 2023-08-11 DIAGNOSIS — K823 Fistula of gallbladder: Secondary | ICD-10-CM | POA: Diagnosis not present

## 2023-08-12 ENCOUNTER — Ambulatory Visit: Attending: Nurse Practitioner | Admitting: Nurse Practitioner

## 2023-08-12 ENCOUNTER — Encounter: Payer: Self-pay | Admitting: Nurse Practitioner

## 2023-08-12 VITALS — BP 118/64 | HR 73 | Ht 69.0 in | Wt 144.2 lb

## 2023-08-12 DIAGNOSIS — I2510 Atherosclerotic heart disease of native coronary artery without angina pectoris: Secondary | ICD-10-CM

## 2023-08-12 DIAGNOSIS — I255 Ischemic cardiomyopathy: Secondary | ICD-10-CM

## 2023-08-12 DIAGNOSIS — R9389 Abnormal findings on diagnostic imaging of other specified body structures: Secondary | ICD-10-CM | POA: Diagnosis not present

## 2023-08-12 DIAGNOSIS — I5032 Chronic diastolic (congestive) heart failure: Secondary | ICD-10-CM

## 2023-08-12 DIAGNOSIS — I10 Essential (primary) hypertension: Secondary | ICD-10-CM

## 2023-08-12 NOTE — Patient Instructions (Addendum)
Medication Instructions:  Your physician recommends that you continue on your current medications as directed. Please refer to the Current Medication list given to you today.   Labwork: None  Testing/Procedures: None  Follow-Up: Your physician recommends that you schedule a follow-up appointment in: 3 months  Any Other Special Instructions Will Be Listed Below (If Applicable).  [facility] you for choosing  [facility]!      If you need a refill on your cardiac medications before your next appointment, please call your pharmacy.

## 2023-08-12 NOTE — Progress Notes (Unsigned)
 Cardiology Office Note:  .   Date:  08/12/2023 ID:  [facility], DOB Nov 03, 1940, MRN [id number] PCP: Terry Needy, PA  [facility] Providers Cardiologist:  Terry Batman, MD    History of Present Illness: .   [facility] is a 83 y.o. 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   Patient was without cardiac history who was hospitalized in September 2024 for chest pain, diagnosed with STEMI and sent to [facility].  Heart catheterization revealed severe CAD, only able to balloon distal LAD.  Cardiothoracic surgery was consulted to evaluate for CABG.  Echocardiogram revealed EF 25 to 30%, advanced heart failure team was consulted for medical optimization prior to surgery.  Patient was diuresed and started on digoxin , cardiogenic shock was resolved.  Underwent three-vessel CABG on December 23, 2022.  LIMA was grafted to left anterior descending coronary artery, separate saphenous vein grafts were placed to obtuse marginal and posterior descending coronary arteries.  Did develop some postop A-fib, and received IV metoprolol  that caused him to convert back to sinus rhythm.  Amiodarone  was continued.  04/01/2023 - Today he presents for follow-up.  He continues to do White and remains compliant with his medications, however he has not started his magnesium  supplement.  He does admit to some memory issues.  He is also requesting to have his medications sent to [facility] to come in via mail order.  Overall doing White from a cardiac perspective.  Denies any chest pain, shortness of breath, palpitations, syncope, presyncope, dizziness, orthopnea, PND, swelling or significant weight changes, acute bleeding, or claudication.   06/14/2023 - Admits to having some chest congestion not long ago  from what sounds like a URI. Reports burning sensation in his feet, causes him to feel off balance at times, has been ongoing for many years. Denies any chest pain, shortness of breath, palpitations, syncope, presyncope, dizziness, orthopnea, PND, swelling or significant weight changes, acute bleeding, or claudication. Denies any recent falls.   Since I have last seen him, he presented to the ED at [facility] on Aug 09, 2023 for upper abdominal pain.  Stated this pain did radiate to his chest briefly.  Denied any back pain.  Denied any shortness of breath.  Chest x-ray revealed minimally increased right basilar opacity, concerning for atelectasis or possibly infiltrate.  Recommended follow-up PA/lateral chest x-ray in the next 3 to 4 weeks following trial of antibiotic therapy to ensure resolution and exclude underlying malignancy.  Lab work was overall unremarkable.  Ultrasound of abdomen showed cholelithiasis and sludge, mild gallbladder wall thickening noted suggesting possibly cholecystitis, findings consistent and concerning for distal common bile duct obstruction with possible pneumobilia, CT scan of the abdomen was recommended for further evaluation.  CT most recent findings of CT scan of abdomen noted below along with recent MRI of abdomen with MRCP.   He was referred to general surgery with [facility].  [facility] however wanted to refer patient to [facility].  Evaluated by [facility] on 08/11/2023.  It was recommended to proceed with HPV consultation for surgical evaluation.  This referral was placed by [facility].  Antibiotic was discontinued given no infectious etiology and was told to resume Plavix .  Today he presents for follow-up.  Patient and  family member confirmed they are waiting on HPB referral from [facility].  Has not been taking aspirin  per his report.  He is compliant with his Plavix .  Denies any issues or recent chest pain. Denies any  chest pain, shortness of breath, palpitations, syncope, presyncope, dizziness, orthopnea, PND, swelling or significant weight changes, acute bleeding, or claudication.  ROS: Negative. See HPI.  Studies Reviewed: [facility]    EKG: EKG is not ordered today.  MRI abdomen 08/09/2023: IMPRESSION: 1. Large gallstone contracted in the gallbladder with associated pneumobilia. The gallbladder wall appears to be frankly fistulized to the adjacent descending duodenum, accounting for pneumobilia. 2. Mild intra and extrahepatic biliary ductal dilatation with associated pneumobilia. The common bile duct measures up to 1.0 cm near the ampulla without calculus or other obstruction visible. 3. Splenomegaly. 4. Colonic diverticulosis.   Aortic Atherosclerosis (ICD10-I70.0).  CT of abdomen/pelvis 08/09/2023:  IMPRESSION: 1. Pneumobilia, gas in the gallbladder, large gallstone in the gallbladder, mild gallbladder wall thickening with likely secondary inflammation of the proximal duodenum, and moderate extrahepatic biliary dilatation. Cannot exclude distal choledocholithiasis or acute cholecystitis. No dilated bowel or obvious calcified gallstone within the bowel to indicate gallstone ileus. Top possibilities include incompetent sphincter of Oddi following passage of a gallstone, or occult spontaneous biliary enteric fistula. Cholangitis is a less likely differential diagnostic consideration. 2. Hiatal hernia with mild distal esophageal wall thickening potentially from esophagitis. 3. Prostatomegaly. 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 5. Aortic and coronary artery atherosclerosis.  Ultrasound abdomen Limited 08/09/2023: IMPRESSION: Cholelithiasis and sludge is noted. Mild gallbladder wall thickening is noted suggesting possible cholecystitis.   Common bile duct dilatation is noted at 8 mm concerning for distal common bile duct obstruction.    Echogenic foci are noted in some biliary ducts suggesting possible pneumobilia.   CT scan of the abdomen is recommended for further evaluation.  Limited Echo 06/2023:   1. Left ventricular ejection fraction, by estimation, is 60 to 65%. Left  ventricular ejection fraction by 3D volume is 62 %. The left ventricle has  normal function. The left ventricle has no regional wall motion  abnormalities. There is mild concentric  left ventricular hypertrophy. Left ventricular diastolic parameters are  consistent with Grade I diastolic dysfunction (impaired relaxation).   2. Right ventricular systolic function was not White visualized. The right  ventricular size is mildly enlarged. There is normal pulmonary artery  systolic pressure. The estimated right ventricular systolic pressure is  19.0 mmHg.   3. Left atrial size was mildly dilated.   4. The mitral valve is degenerative. Mild mitral valve regurgitation. The  mean mitral valve gradient is 2.0 mmHg.   5. The inferior vena cava is normal in size with greater than 50%  respiratory variability, suggesting right atrial pressure of 3 mmHg.   Comparison(s): A prior study was performed on 12/16/2022. Prior images  reviewed side by side. LVEF has normalized in range of 60-65% in  comparison. TEE 12/2022: POST-OP IMPRESSIONS  _ Left Ventricle: has mildly reduced systolic function, with an ejection  fraction of 40%. The cavity size was normal. The wall motion is abnormal  with  regional variation.  _ Right Ventricle: The right ventricle appears unchanged from pre-bypass.  _ Aorta: The aorta appears unchanged from pre-bypass.  _ Left Atrial Appendage: The left atrial appendage appears unchanged from  pre-bypass.  _ Aortic Valve: The aortic valve appears unchanged from pre-bypass.  _ Mitral Valve: There is  mild regurgitation.  _ Tricuspid Valve: There is no regurgitation.  _ Pulmonic Valve: The pulmonic valve appears unchanged from pre-bypass.  _  Interatrial Septum: The interatrial septum appears unchanged from  pre-bypass.  _ Pericardium: The pericardium appears unchanged from pre-bypass.  _ Comments: Post-bypass images reviewed with surgeon. Limitations of exam  unchanged from pre-bypass exam.   PRE-OP FINDINGS   Left Ventricle: The left ventricle has mild-moderately reduced systolic  function, with an ejection fraction of 40-45%. The cavity size was normal.  Septal and apical hypokinesis.    Right Ventricle: The right ventricle has normal systolic function. The  cavity was normal. There is no increase in right ventricular wall  thickness.   Left Terry: Left atrial size was normal in size. No left atrial/left  atrial appendage thrombus was detected.   Right Terry: Right atrial size was normal in size.   Interatrial Septum: No atrial level shunt detected by color flow Doppler.   Pericardium: There is no evidence of pericardial effusion.   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 Mitral valve regurgitation  is mild by color flow Doppler.   Tricuspid Valve: The tricuspid valve was normal in structure. Tricuspid  valve regurgitation is trivial by color flow Doppler.   Aortic Valve: The aortic valve is tricuspid Aortic valve regurgitation was  not visualized by color flow Doppler.    Pulmonic Valve: The pulmonic valve was normal in structure.  Pulmonic valve regurgitation is mild by color flow Doppler.    Aorta: The aortic root, ascending aorta and aortic arch are normal in size  and structure. The descending aorta was not White visualized. Unable to  visualize descending aorta.    Vascular ultrasound dopplers Pre-CABG 12/2022: Summary:  Right Carotid: Velocities in the right ICA are consistent with a 1-39%  stenosis.   Left Carotid: Velocities in the left ICA are consistent with a 1-39%  stenosis.  Vertebrals: Bilateral vertebral arteries demonstrate antegrade flow.   Right ABI: Resting right  ankle-brachial index is within normal range.  Left ABI: Resting left ankle-brachial index is within normal range.  Left Upper Extremity: Doppler waveform obliterate with left radial  compression. Doppler waveforms decrease >50% with left ulnar compression.  Echo 12/2022:   1. Left ventricular ejection fraction, by estimation, is 25 to 30%. The  left ventricle has severely decreased function. The left ventricle  demonstrates regional wall motion abnormalities with mid to apical  anteroseptal/inferoseptal akinesis, apical  anterior/inferior/lateral akinesis, and akinesis of the true apex. No LV  thrombus noted. There is mild concentric left ventricular hypertrophy.  Left ventricular diastolic parameters are consistent with Grade I  diastolic dysfunction (impaired relaxation).   2. Right ventricular systolic function is normal. The right ventricular  size is normal. There is normal pulmonary artery systolic pressure. The  estimated right ventricular systolic pressure is 21.5 mmHg.   3. The mitral valve is degenerative. Mild mitral valve regurgitation. No  evidence of mitral stenosis.   4. The aortic valve is tricuspid. There is mild calcification of the  aortic valve. Aortic valve regurgitation is not visualized. Aortic valve  sclerosis is present, with no evidence of aortic valve stenosis.   5. The inferior vena cava is normal in size with <50% respiratory  variability, suggesting right atrial pressure of 8 mmHg.   Comparison(s): No prior Echocardiogram.  LHC 12/2022:   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   Prox RCA lesion is 100% stenosed.   Mid Cx to [facility]  Cx lesion is 100% stenosed.   Mid LAD-1 lesion is 99% stenosed.   Mid LAD-2 lesion is 99% stenosed.   Balloon angioplasty was performed using a BALLN EMERGE MR 2.0X15.   Balloon angioplasty was performed using a BALLN [employer] EMERGE MR 2.5X15.   Post intervention, there is a 90% residual stenosis.   Post intervention, there is  a 99% residual stenosis.   Acute anterior STEMI The LAD is a large caliber vessel that courses to the apex. The mid LAD has serial 99% heavily calcified stenoses. The distal LAD has mild non-obstructive disease The Circumflex has 100% chronic occlusion in the mid AV groove segment. The distal AV groove Circumflex fills from left to left collaterals.  The large dominant RCA has 100% chronic proximal occlusion. The distal RCA fills from right to right and left to right collaterals.  Balloon angioplasty of the mid LAD with inability to full expand the balloon due to heavy calcification. TIMI-3 flow at the conclusion of the case. The patient was chest pain free.    Recommendations: Will admit to ICU. I was unable to fully expand the lesions in the mid LAD but flow down the LAD was improved with balloon angioplasty and with medical therapy. 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 Will ask CT surgery to see him to discuss bypass surgery. Echo later today. Continue Aggrastat  infusion for now. Will continue ASA and start a high intensity statin. (No oral anti-platelet agent was given).   Physical Exam:   VS:  BP 118/64   Pulse 73   Ht 5\' 9"  (1.753 m)   Wt 144 lb 3.2 oz (65.4 kg)   SpO2 97%   BMI 21.29 kg/m    Wt Readings from Last 3 Encounters:  08/12/23 144 lb 3.2 oz (65.4 kg)  08/09/23 140 lb (63.5 kg)  06/14/23 149 lb (67.6 kg)    GEN: White nourished, White developed in no acute distress NECK: No JVD; No carotid bruits CARDIAC: S1/S2, RRR, no murmurs, rubs, gallops RESPIRATORY:  Clear to auscultation without rales, wheezing or rhonchi  ABDOMEN: Soft, non-tender, non-distended EXTREMITIES:  No edema; No deformity   ASSESSMENT AND PLAN: .    CAD, s/p CABG x 3 (12/2022), hx of acute STEMI Denies any chest pain/anginal symptoms.  Continue current medication regimen.  Instructed him to restart  aspirin  81 mg daily. Heart healthy diet and regular cardiovascular exercise encouraged.   2. HFimpEF, ICM Stage C, NYHA class I-II symptoms.  Most recent limited echo in March 2025 showed EF at 60 to 65%, recovered and returned to normal, grade 1 DD.  GDMT limited due to patient's BP trends. Continue current medication regimen. Low sodium diet, fluid restriction <2L, and daily weights encouraged. Educated to contact our office for weight gain of 2 lbs overnight or 5 lbs in one week.    3. HTN BP stable. Discussed to monitor BP at home at least 2 hours after medications and sitting for 5-10 minutes. No medication changes at this time. Heart healthy diet and regular cardiovascular exercise encouraged.   4.   Abnormal CT scan See most recent workup noted above and currently being evaluated by [facility].  Patient and family member confirm they are waiting on [facility] referral.  Defer further management to general surgery team at [facility] and ED precautions discussed. Continue to follow with PCP.  I spent a total duration of 40 minutes reviewing prior notes, reviewing outside records including  labs, recent ED visit at [facility],  face-to-face counseling of medical condition, pathophysiology, evaluation, management, and documenting the findings in the note.  : Follow-up with me/APP in 3 months or sooner if anything changes.   Signed, Lasalle Pointer, NP

## 2023-08-18 ENCOUNTER — Telehealth: Payer: Self-pay | Admitting: Nurse Practitioner

## 2023-08-18 NOTE — Telephone Encounter (Signed)
-----   Message from Lasalle Pointer sent at 08/16/2023  3:48 PM EDT ----- I instructed patient when I last saw him that he needs to restart his aspirin  81 mg daily.  Can you please confirm with him that he has done this? If so, please add this to his home medication list.  Thanks!   Best, Lasalle Pointer, NP

## 2023-08-18 NOTE — Telephone Encounter (Signed)
 Patient had already restarted.  Med list updated

## 2023-09-08 DIAGNOSIS — K823 Fistula of gallbladder: Secondary | ICD-10-CM | POA: Diagnosis not present

## 2023-09-15 ENCOUNTER — Ambulatory Visit: Admitting: Nurse Practitioner

## 2023-10-11 DIAGNOSIS — Z1389 Encounter for screening for other disorder: Secondary | ICD-10-CM | POA: Diagnosis not present

## 2023-10-11 DIAGNOSIS — Z0001 Encounter for general adult medical examination with abnormal findings: Secondary | ICD-10-CM | POA: Diagnosis not present

## 2023-10-11 DIAGNOSIS — Z6821 Body mass index (BMI) 21.0-21.9, adult: Secondary | ICD-10-CM | POA: Diagnosis not present

## 2023-10-11 DIAGNOSIS — Z1331 Encounter for screening for depression: Secondary | ICD-10-CM | POA: Diagnosis not present

## 2023-10-11 DIAGNOSIS — Z0000 Encounter for general adult medical examination without abnormal findings: Secondary | ICD-10-CM | POA: Diagnosis not present

## 2023-10-13 DIAGNOSIS — K8010 Calculus of gallbladder with chronic cholecystitis without obstruction: Secondary | ICD-10-CM | POA: Diagnosis not present

## 2023-10-13 DIAGNOSIS — K823 Fistula of gallbladder: Secondary | ICD-10-CM | POA: Diagnosis not present

## 2023-10-18 DIAGNOSIS — R944 Abnormal results of kidney function studies: Secondary | ICD-10-CM | POA: Diagnosis not present

## 2023-10-18 DIAGNOSIS — E1121 Type 2 diabetes mellitus with diabetic nephropathy: Secondary | ICD-10-CM | POA: Diagnosis not present

## 2023-10-18 DIAGNOSIS — E119 Type 2 diabetes mellitus without complications: Secondary | ICD-10-CM | POA: Diagnosis not present

## 2023-10-18 DIAGNOSIS — D649 Anemia, unspecified: Secondary | ICD-10-CM | POA: Diagnosis not present

## 2023-10-18 DIAGNOSIS — D529 Folate deficiency anemia, unspecified: Secondary | ICD-10-CM | POA: Diagnosis not present

## 2023-10-18 DIAGNOSIS — D519 Vitamin B12 deficiency anemia, unspecified: Secondary | ICD-10-CM | POA: Diagnosis not present

## 2023-10-20 DIAGNOSIS — K819 Cholecystitis, unspecified: Secondary | ICD-10-CM | POA: Diagnosis not present

## 2023-10-20 DIAGNOSIS — K823 Fistula of gallbladder: Secondary | ICD-10-CM | POA: Diagnosis not present

## 2023-10-20 DIAGNOSIS — L988 Other specified disorders of the skin and subcutaneous tissue: Secondary | ICD-10-CM | POA: Diagnosis not present

## 2023-10-25 DIAGNOSIS — M109 Gout, unspecified: Secondary | ICD-10-CM | POA: Diagnosis not present

## 2023-10-25 DIAGNOSIS — E1121 Type 2 diabetes mellitus with diabetic nephropathy: Secondary | ICD-10-CM | POA: Diagnosis not present

## 2023-10-25 DIAGNOSIS — I10 Essential (primary) hypertension: Secondary | ICD-10-CM | POA: Diagnosis not present

## 2023-10-25 DIAGNOSIS — Z6820 Body mass index (BMI) 20.0-20.9, adult: Secondary | ICD-10-CM | POA: Diagnosis not present

## 2023-10-25 DIAGNOSIS — I2510 Atherosclerotic heart disease of native coronary artery without angina pectoris: Secondary | ICD-10-CM | POA: Diagnosis not present

## 2023-11-15 DIAGNOSIS — Z87891 Personal history of nicotine dependence: Secondary | ICD-10-CM | POA: Diagnosis not present

## 2023-11-15 DIAGNOSIS — Z01818 Encounter for other preprocedural examination: Secondary | ICD-10-CM | POA: Diagnosis not present

## 2023-11-15 DIAGNOSIS — I110 Hypertensive heart disease with heart failure: Secondary | ICD-10-CM | POA: Diagnosis not present

## 2023-11-15 DIAGNOSIS — Z79899 Other long term (current) drug therapy: Secondary | ICD-10-CM | POA: Diagnosis not present

## 2023-11-15 DIAGNOSIS — E119 Type 2 diabetes mellitus without complications: Secondary | ICD-10-CM | POA: Diagnosis not present

## 2023-11-15 DIAGNOSIS — E785 Hyperlipidemia, unspecified: Secondary | ICD-10-CM | POA: Diagnosis not present

## 2023-11-15 DIAGNOSIS — I2510 Atherosclerotic heart disease of native coronary artery without angina pectoris: Secondary | ICD-10-CM | POA: Diagnosis not present

## 2023-11-15 DIAGNOSIS — K823 Fistula of gallbladder: Secondary | ICD-10-CM | POA: Diagnosis not present

## 2023-11-15 DIAGNOSIS — I5020 Unspecified systolic (congestive) heart failure: Secondary | ICD-10-CM | POA: Diagnosis not present

## 2023-11-18 ENCOUNTER — Encounter: Payer: Self-pay | Admitting: Nurse Practitioner

## 2023-11-18 ENCOUNTER — Ambulatory Visit: Attending: Nurse Practitioner | Admitting: Nurse Practitioner

## 2023-11-18 VITALS — BP 108/62 | HR 82 | Ht 69.5 in | Wt 145.0 lb

## 2023-11-18 DIAGNOSIS — I5032 Chronic diastolic (congestive) heart failure: Secondary | ICD-10-CM

## 2023-11-18 DIAGNOSIS — R9389 Abnormal findings on diagnostic imaging of other specified body structures: Secondary | ICD-10-CM | POA: Diagnosis not present

## 2023-11-18 DIAGNOSIS — I10 Essential (primary) hypertension: Secondary | ICD-10-CM | POA: Diagnosis not present

## 2023-11-18 DIAGNOSIS — Z01810 Encounter for preprocedural cardiovascular examination: Secondary | ICD-10-CM | POA: Diagnosis not present

## 2023-11-18 DIAGNOSIS — I255 Ischemic cardiomyopathy: Secondary | ICD-10-CM

## 2023-11-18 DIAGNOSIS — I2510 Atherosclerotic heart disease of native coronary artery without angina pectoris: Secondary | ICD-10-CM

## 2023-11-18 NOTE — Progress Notes (Unsigned)
 Cardiology Office Note:  .   Date:  11/18/2023 ID:  Craig Sequin, DOB 13-Jun-1940, MRN [id number] PCP: Jolee Elsie RAMAN, PA  [facility] Providers Cardiologist:  Lonni Cash, MD    History of Present Illness: .   Terry White is a 83 y.o. 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   Patient was without cardiac history who was hospitalized in September 2024 for chest pain, diagnosed with STEMI and sent to [facility].  Heart catheterization revealed severe CAD, only able to balloon distal LAD.  Cardiothoracic surgery was consulted to evaluate for CABG.  Echocardiogram revealed EF 25 to 30%, advanced heart failure team was consulted for medical optimization prior to surgery.  Patient was diuresed and started on digoxin, cardiogenic shock was resolved.  Underwent three-vessel CABG on December 23, 2022.  LIMA was grafted to left anterior descending coronary artery, separate saphenous vein grafts were placed to obtuse marginal and posterior descending coronary arteries.  Did develop some postop A-fib, and received IV metoprolol that caused him to convert back to sinus rhythm.  Amiodarone was continued.  04/01/2023 - Today he presents for follow-up.  He continues to do well and remains compliant with his medications, however he has not started his magnesium supplement.  He does admit to some memory issues.  He is also requesting to have [facility] order.  Overall doing well from a cardiac perspective.  Denies any chest pain, shortness of breath, palpitations, syncope, presyncope, dizziness, orthopnea, PND, swelling or significant weight changes, acute bleeding, or claudication.   06/14/2023 - Admits to having some chest congestion not long ago  from what sounds like a URI. Reports burning sensation in his feet, causes him to feel off balance at times, has been ongoing for many years. Denies any chest pain, shortness of breath, palpitations, syncope, presyncope, dizziness, orthopnea, PND, swelling or significant weight changes, acute bleeding, or claudication. Denies any recent falls.   Since I have last seen him, he presented to the ED at [facility] on Aug 09, 2023 for upper abdominal pain.  Stated this pain did radiate to his chest briefly.  Denied any back pain.  Denied any shortness of breath.  Chest x-ray revealed minimally increased right basilar opacity, concerning for atelectasis or possibly infiltrate.  Recommended follow-up PA/lateral chest x-ray in the next 3 to 4 weeks following trial of antibiotic therapy to ensure resolution and exclude underlying malignancy.  Lab work was overall unremarkable.  Ultrasound of abdomen showed cholelithiasis and sludge, mild gallbladder wall thickening noted suggesting possibly cholecystitis, findings consistent and concerning for distal common bile duct obstruction with possible pneumobilia, CT scan of the abdomen was recommended for further evaluation.  CT most recent findings of CT scan of abdomen noted below along with recent MRI of abdomen with MRCP.   He was referred to general surgery with [facility].  Dr. Mavis however wanted to refer patient to [facility].  Evaluated by Dr. Garnette Ginger of [facility] on 08/11/2023.  It was recommended to proceed with [facility] consultation for surgical evaluation.  This referral was placed by Dr. Ginger.  Antibiotic was discontinued given no infectious etiology and was told to resume Plavix.  08/12/2023 - Today he presents for follow-up.  Patient and family member confirmed they are waiting on [facility].  Has not been taking aspirin per his report.  He is compliant with his Plavix.  Denies any issues or recent chest pain.  Denies any chest pain, shortness of breath, palpitations, syncope, presyncope, dizziness, orthopnea, PND, swelling or significant weight changes, acute bleeding, or claudication.  11/18/2023 - Here for follow-up.  Doing well and denies any acute cardiac complaints or issues. Denies any chest pain, shortness of breath, palpitations, syncope, presyncope, dizziness, orthopnea, PND, swelling or significant weight changes, acute bleeding, or claudication.  He is pending a robotic cholecystectomy and duodenal fistula repair at [facility].   ROS: Negative. See HPI.  Studies Reviewed: SABRA    EKG: EKG is not ordered today.  MRI abdomen 08/09/2023: IMPRESSION: 1. Large gallstone contracted in the gallbladder with associated pneumobilia. The gallbladder wall appears to be frankly fistulized to the adjacent descending duodenum, accounting for pneumobilia. 2. Mild intra and extrahepatic biliary ductal dilatation with associated pneumobilia. The common bile duct measures up to 1.0 cm near the ampulla without calculus or other obstruction visible. 3. Splenomegaly. 4. Colonic diverticulosis.   Aortic Atherosclerosis (ICD10-I70.0).  CT of abdomen/pelvis 08/09/2023:  IMPRESSION: 1. Pneumobilia, gas in the gallbladder, large gallstone in the gallbladder, mild gallbladder wall thickening with likely secondary inflammation of the proximal duodenum, and moderate extrahepatic biliary dilatation. Cannot exclude distal choledocholithiasis or acute cholecystitis. No dilated bowel or obvious calcified gallstone within the bowel to indicate gallstone ileus. Top possibilities include incompetent sphincter of Oddi following passage of a gallstone, or occult spontaneous biliary enteric fistula. Cholangitis is a less likely differential diagnostic consideration. 2. Hiatal hernia with mild distal esophageal wall thickening potentially from esophagitis. 3. Prostatomegaly. 4. 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 5. Aortic and coronary artery atherosclerosis.  Ultrasound abdomen Limited 08/09/2023: IMPRESSION: Cholelithiasis and sludge is noted. Mild gallbladder wall thickening is noted suggesting possible cholecystitis.   Common bile duct dilatation is noted at 8 mm concerning for distal common bile duct obstruction.   Echogenic foci are noted in some biliary ducts suggesting possible pneumobilia.   CT scan of the abdomen is recommended for further evaluation.  Limited Echo 06/2023:   1. Left ventricular ejection fraction, by estimation, is 60 to 65%. Left  ventricular ejection fraction by 3D volume is 62 %. The left ventricle has  normal function. The left ventricle has no regional wall motion  abnormalities. There is mild concentric  left ventricular hypertrophy. Left ventricular diastolic parameters are  consistent with Grade I diastolic dysfunction (impaired relaxation).   2. Right ventricular systolic function was not well visualized. The right  ventricular size is mildly enlarged. There is normal pulmonary artery  systolic pressure. The estimated right ventricular systolic pressure is  19.0 mmHg.   3. Left atrial size was mildly dilated.   4. The mitral valve is degenerative. Mild mitral valve regurgitation. The  mean mitral valve gradient is 2.0 mmHg.   5. The inferior vena cava is normal in size with greater than 50%  respiratory variability, suggesting right atrial pressure of 3 mmHg.   Comparison(s): A prior study was performed on 12/16/2022. Prior images  reviewed side by side. LVEF has normalized in range of 60-65% in  comparison. TEE 12/2022: POST-OP IMPRESSIONS  _ Left Ventricle: has mildly reduced systolic function, with an ejection  fraction of 40%. The cavity size was normal. The wall motion is abnormal  with  regional variation.  _ Right Ventricle: The right ventricle appears unchanged from pre-bypass.  _ Aorta: The  aorta appears unchanged from pre-bypass.  _ Left Atrial Appendage: The left atrial appendage appears unchanged from  pre-bypass.  _ Aortic Valve: The aortic valve appears unchanged from pre-bypass.  _ Mitral Valve: There is mild regurgitation.  _ Tricuspid Valve: There is no regurgitation.  _ Pulmonic Valve: The pulmonic valve appears unchanged from pre-bypass.  _ Interatrial Septum: The interatrial septum appears unchanged from  pre-bypass.  _ Pericardium: The pericardium appears unchanged from pre-bypass.  _ Comments: Post-bypass images reviewed with surgeon. Limitations of exam  unchanged from pre-bypass exam.   PRE-OP FINDINGS   Left Ventricle: The left ventricle has mild-moderately reduced systolic  function, with an ejection fraction of 40-45%. The cavity size was normal.  Septal and apical hypokinesis.    Right Ventricle: The right ventricle has normal systolic function. The  cavity was normal. There is no increase in right ventricular wall  thickness.   Left [facility]: Left atrial size was normal in size. No left atrial/left  atrial appendage thrombus was detected.   Right [facility]: Right atrial size was normal in size.   Interatrial Septum: No atrial level shunt detected by color flow Doppler.   Pericardium: There is no evidence of pericardial effusion.   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 Mitral valve regurgitation  is mild by color flow Doppler.   Tricuspid Valve: The tricuspid valve was normal in structure. Tricuspid  valve regurgitation is trivial by color flow Doppler.   Aortic Valve: The aortic valve is tricuspid Aortic valve regurgitation was  not visualized by color flow Doppler.    Pulmonic Valve: The pulmonic valve was normal in structure.  Pulmonic valve regurgitation is mild by color flow Doppler.    Aorta: The aortic root, ascending aorta and aortic arch are normal in size  and structure. The descending aorta was not well visualized. Unable  to  visualize descending aorta.    Vascular ultrasound dopplers Pre-CABG 12/2022: Summary:  Right Carotid: Velocities in the right ICA are consistent with a 1-39%  stenosis.   Left Carotid: Velocities in the left ICA are consistent with a 1-39%  stenosis.  Vertebrals: Bilateral vertebral arteries demonstrate antegrade flow.   Right ABI: Resting right ankle-brachial index is within normal range.  Left ABI: Resting left ankle-brachial index is within normal range.  Left Upper Extremity: Doppler waveform obliterate with left radial  compression. Doppler waveforms decrease >50% with left ulnar compression.  Echo 12/2022:   1. Left ventricular ejection fraction, by estimation, is 25 to 30%. The  left ventricle has severely decreased function. The left ventricle  demonstrates regional wall motion abnormalities with mid to apical  anteroseptal/inferoseptal akinesis, apical  anterior/inferior/lateral akinesis, and akinesis of the true apex. No LV  thrombus noted. There is mild concentric left ventricular hypertrophy.  Left ventricular diastolic parameters are consistent with Grade I  diastolic dysfunction (impaired relaxation).   2. Right ventricular systolic function is normal. The right ventricular  size is normal. There is normal pulmonary artery systolic pressure. The  estimated right ventricular systolic pressure is 21.5 mmHg.   3. The mitral valve is degenerative. Mild mitral valve regurgitation. No  evidence of mitral stenosis.   4. The aortic valve is tricuspid. There is mild calcification of the  aortic valve. Aortic valve regurgitation is not visualized. Aortic valve  sclerosis is present, with no evidence of aortic valve stenosis.  5. The inferior vena cava is normal in size with <50% respiratory  variability, suggesting right atrial pressure of 8 mmHg.   Comparison(s): No prior Echocardiogram.  LHC 12/2022:   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   Prox RCA lesion is  100% stenosed.   Mid Cx to Dist Cx lesion is 100% stenosed.   Mid LAD-1 lesion is 99% stenosed.   Mid LAD-2 lesion is 99% stenosed.   Balloon angioplasty was performed using a BALLN EMERGE MR 2.0X15.   Balloon angioplasty was performed using a BALLN [place] EMERGE MR 2.5X15.   Post intervention, there is a 90% residual stenosis.   Post intervention, there is a 99% residual stenosis.   Acute anterior STEMI The LAD is a large caliber vessel that courses to the apex. The mid LAD has serial 99% heavily calcified stenoses. The distal LAD has mild non-obstructive disease The Circumflex has 100% chronic occlusion in the mid AV groove segment. The distal AV groove Circumflex fills from left to left collaterals.  The large dominant RCA has 100% chronic proximal occlusion. The distal RCA fills from right to right and left to right collaterals.  Balloon angioplasty of the mid LAD with inability to full expand the balloon due to heavy calcification. TIMI-3 flow at the conclusion of the case. The patient was chest pain free.    Recommendations: Will admit to ICU. I was unable to fully expand the lesions in the mid LAD but flow down the LAD was improved with balloon angioplasty and with medical therapy. 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 Will ask CT surgery to see him to discuss bypass surgery. Echo later today. Continue Aggrastat infusion for now. Will continue ASA and start a high intensity statin. (No oral anti-platelet agent was given).   Physical Exam:   VS:  BP 108/62 (BP Location: Left Arm)   Pulse 82   Ht 5' 9.5 (1.765 m)   Wt 145 lb (65.8 kg)   SpO2 98%   BMI 21.11 kg/m    Wt Readings from Last 3 Encounters:  11/18/23 145 lb (65.8 kg)  08/12/23 144 lb 3.2 oz (65.4 kg)  08/09/23 140 lb (63.5 kg)    GEN: Well nourished, well developed in no acute distress NECK: No JVD; No carotid bruits CARDIAC:  S1/S2, RRR, no murmurs, rubs, gallops RESPIRATORY:  Clear to auscultation without rales, wheezing or rhonchi  ABDOMEN: Soft, non-tender, non-distended EXTREMITIES:  No edema; No deformity   ASSESSMENT AND PLAN: .    CAD, s/p CABG x 3 (12/2022), hx of acute STEMI Denies any chest pain/anginal symptoms.  Continue current medication regimen. Heart healthy diet and regular cardiovascular exercise encouraged.   2. HFimpEF, ICM Stage C, NYHA class I-II symptoms.  Most recent limited echo in March 2025 showed EF at 60 to 65%, recovered and returned to normal, grade 1 DD.  GDMT limited due to patient's BP trends. Continue current medication regimen. Low sodium diet, fluid restriction <2L, and daily weights encouraged. Educated to contact our office for weight gain of 2 lbs overnight or 5 lbs in one week.    3. HTN BP stable. Discussed to monitor BP at home at least 2 hours after medications and sitting for 5-10 minutes. No medication changes at this time. Heart healthy diet and regular cardiovascular exercise encouraged.   4.   Abnormal CT scan See most recent workup  noted above and currently being evaluated by [facility]  He is pending future GI procedure as outlined in HPI. Care and ED precautions discussed. Continue to follow with PCP.  5. Pre-op cardiovascular evaluation Mr. Custis perioperative risk of a major cardiac event is 6.6% according to the Revised Cardiac Risk Index (RCRI).  Therefore, he is at high risk for perioperative complications.   His functional capacity is excellent at 6.61 METs according to the Duke Activity Status Index (DASI). Recommendations: According to ACC/AHA guidelines, no further cardiovascular testing needed.  The patient may proceed to surgery at acceptable risk.   Antiplatelet and/or Anticoagulation Recommendations: The patient should remain on Aspirin without interruption.  Patient is already holding Plavix per instructions from atrial  health per son's report.    I spent a total duration of 30 minutes reviewing prior notes, reviewing outside records including  labs, recent ED visit at [facility],  face-to-face counseling of medical condition, pathophysiology, evaluation, management, and documenting the findings in the note.  Dispo: Follow-up with MD/APP in 6 months or sooner if anything changes.   Signed, Almarie Crate, NP

## 2023-11-18 NOTE — Patient Instructions (Addendum)

## 2023-11-22 DIAGNOSIS — L988 Other specified disorders of the skin and subcutaneous tissue: Secondary | ICD-10-CM | POA: Diagnosis not present

## 2023-11-22 DIAGNOSIS — Z539 Procedure and treatment not carried out, unspecified reason: Secondary | ICD-10-CM | POA: Diagnosis not present

## 2023-12-02 DIAGNOSIS — R109 Unspecified abdominal pain: Secondary | ICD-10-CM | POA: Diagnosis not present

## 2023-12-02 DIAGNOSIS — R1010 Upper abdominal pain, unspecified: Secondary | ICD-10-CM | POA: Diagnosis not present

## 2023-12-02 DIAGNOSIS — Z5321 Procedure and treatment not carried out due to patient leaving prior to being seen by health care provider: Secondary | ICD-10-CM | POA: Diagnosis not present

## 2023-12-02 DIAGNOSIS — Z5329 Procedure and treatment not carried out because of patient's decision for other reasons: Secondary | ICD-10-CM | POA: Diagnosis not present

## 2023-12-26 DIAGNOSIS — K819 Cholecystitis, unspecified: Secondary | ICD-10-CM | POA: Diagnosis not present

## 2023-12-26 DIAGNOSIS — E119 Type 2 diabetes mellitus without complications: Secondary | ICD-10-CM | POA: Diagnosis not present

## 2023-12-26 DIAGNOSIS — K831 Obstruction of bile duct: Secondary | ICD-10-CM | POA: Diagnosis not present

## 2023-12-26 DIAGNOSIS — I4891 Unspecified atrial fibrillation: Secondary | ICD-10-CM | POA: Diagnosis not present

## 2023-12-26 DIAGNOSIS — K660 Peritoneal adhesions (postprocedural) (postinfection): Secondary | ICD-10-CM | POA: Diagnosis not present

## 2023-12-26 DIAGNOSIS — J90 Pleural effusion, not elsewhere classified: Secondary | ICD-10-CM | POA: Diagnosis not present

## 2023-12-26 DIAGNOSIS — K823 Fistula of gallbladder: Secondary | ICD-10-CM | POA: Diagnosis not present

## 2023-12-26 DIAGNOSIS — Z980 Intestinal bypass and anastomosis status: Secondary | ICD-10-CM | POA: Diagnosis not present

## 2023-12-26 DIAGNOSIS — E782 Mixed hyperlipidemia: Secondary | ICD-10-CM | POA: Diagnosis not present

## 2023-12-26 DIAGNOSIS — Z5331 Laparoscopic surgical procedure converted to open procedure: Secondary | ICD-10-CM | POA: Diagnosis not present

## 2023-12-26 DIAGNOSIS — G8929 Other chronic pain: Secondary | ICD-10-CM | POA: Diagnosis not present

## 2023-12-26 DIAGNOSIS — I110 Hypertensive heart disease with heart failure: Secondary | ICD-10-CM | POA: Diagnosis not present

## 2023-12-26 DIAGNOSIS — K8050 Calculus of bile duct without cholangitis or cholecystitis without obstruction: Secondary | ICD-10-CM | POA: Diagnosis not present

## 2023-12-26 DIAGNOSIS — K8045 Calculus of bile duct with chronic cholecystitis with obstruction: Secondary | ICD-10-CM | POA: Diagnosis not present

## 2023-12-26 DIAGNOSIS — Z794 Long term (current) use of insulin: Secondary | ICD-10-CM | POA: Diagnosis not present

## 2023-12-26 DIAGNOSIS — R194 Change in bowel habit: Secondary | ICD-10-CM | POA: Diagnosis not present

## 2023-12-26 DIAGNOSIS — Z4682 Encounter for fitting and adjustment of non-vascular catheter: Secondary | ICD-10-CM | POA: Diagnosis not present

## 2023-12-26 DIAGNOSIS — I2510 Atherosclerotic heart disease of native coronary artery without angina pectoris: Secondary | ICD-10-CM | POA: Diagnosis not present

## 2023-12-26 DIAGNOSIS — Z4659 Encounter for fitting and adjustment of other gastrointestinal appliance and device: Secondary | ICD-10-CM | POA: Diagnosis not present

## 2023-12-26 DIAGNOSIS — J9601 Acute respiratory failure with hypoxia: Secondary | ICD-10-CM | POA: Diagnosis not present

## 2023-12-26 DIAGNOSIS — E1165 Type 2 diabetes mellitus with hyperglycemia: Secondary | ICD-10-CM | POA: Diagnosis not present

## 2023-12-26 DIAGNOSIS — K811 Chronic cholecystitis: Secondary | ICD-10-CM | POA: Diagnosis not present

## 2023-12-26 DIAGNOSIS — M7989 Other specified soft tissue disorders: Secondary | ICD-10-CM | POA: Diagnosis not present

## 2023-12-26 DIAGNOSIS — I5032 Chronic diastolic (congestive) heart failure: Secondary | ICD-10-CM | POA: Diagnosis not present

## 2023-12-27 DIAGNOSIS — E119 Type 2 diabetes mellitus without complications: Secondary | ICD-10-CM | POA: Diagnosis not present

## 2023-12-27 DIAGNOSIS — K8050 Calculus of bile duct without cholangitis or cholecystitis without obstruction: Secondary | ICD-10-CM | POA: Diagnosis not present

## 2023-12-27 DIAGNOSIS — Z951 Presence of aortocoronary bypass graft: Secondary | ICD-10-CM | POA: Diagnosis not present

## 2023-12-27 DIAGNOSIS — E782 Mixed hyperlipidemia: Secondary | ICD-10-CM | POA: Diagnosis not present

## 2023-12-27 DIAGNOSIS — I2510 Atherosclerotic heart disease of native coronary artery without angina pectoris: Secondary | ICD-10-CM | POA: Diagnosis not present

## 2023-12-28 DIAGNOSIS — R0902 Hypoxemia: Secondary | ICD-10-CM | POA: Diagnosis not present

## 2023-12-28 DIAGNOSIS — R194 Change in bowel habit: Secondary | ICD-10-CM | POA: Diagnosis not present

## 2023-12-28 DIAGNOSIS — E119 Type 2 diabetes mellitus without complications: Secondary | ICD-10-CM | POA: Diagnosis not present

## 2023-12-28 DIAGNOSIS — J690 Pneumonitis due to inhalation of food and vomit: Secondary | ICD-10-CM | POA: Diagnosis not present

## 2023-12-28 DIAGNOSIS — Z951 Presence of aortocoronary bypass graft: Secondary | ICD-10-CM | POA: Diagnosis not present

## 2023-12-28 DIAGNOSIS — I2510 Atherosclerotic heart disease of native coronary artery without angina pectoris: Secondary | ICD-10-CM | POA: Diagnosis not present

## 2023-12-29 DIAGNOSIS — I361 Nonrheumatic tricuspid (valve) insufficiency: Secondary | ICD-10-CM | POA: Diagnosis not present

## 2023-12-29 DIAGNOSIS — R0902 Hypoxemia: Secondary | ICD-10-CM | POA: Diagnosis not present

## 2023-12-29 DIAGNOSIS — Z4682 Encounter for fitting and adjustment of non-vascular catheter: Secondary | ICD-10-CM | POA: Diagnosis not present

## 2023-12-29 DIAGNOSIS — K8050 Calculus of bile duct without cholangitis or cholecystitis without obstruction: Secondary | ICD-10-CM | POA: Diagnosis not present

## 2023-12-29 DIAGNOSIS — E119 Type 2 diabetes mellitus without complications: Secondary | ICD-10-CM | POA: Diagnosis not present

## 2023-12-29 DIAGNOSIS — Z951 Presence of aortocoronary bypass graft: Secondary | ICD-10-CM | POA: Diagnosis not present

## 2023-12-29 DIAGNOSIS — J90 Pleural effusion, not elsewhere classified: Secondary | ICD-10-CM | POA: Diagnosis not present

## 2023-12-29 DIAGNOSIS — I2510 Atherosclerotic heart disease of native coronary artery without angina pectoris: Secondary | ICD-10-CM | POA: Diagnosis not present

## 2023-12-29 DIAGNOSIS — R194 Change in bowel habit: Secondary | ICD-10-CM | POA: Diagnosis not present

## 2023-12-29 DIAGNOSIS — J690 Pneumonitis due to inhalation of food and vomit: Secondary | ICD-10-CM | POA: Diagnosis not present

## 2023-12-30 DIAGNOSIS — Z452 Encounter for adjustment and management of vascular access device: Secondary | ICD-10-CM | POA: Diagnosis not present

## 2023-12-30 DIAGNOSIS — K8050 Calculus of bile duct without cholangitis or cholecystitis without obstruction: Secondary | ICD-10-CM | POA: Diagnosis not present

## 2023-12-30 DIAGNOSIS — R194 Change in bowel habit: Secondary | ICD-10-CM | POA: Diagnosis not present

## 2023-12-30 DIAGNOSIS — Z951 Presence of aortocoronary bypass graft: Secondary | ICD-10-CM | POA: Diagnosis not present

## 2023-12-30 DIAGNOSIS — I2510 Atherosclerotic heart disease of native coronary artery without angina pectoris: Secondary | ICD-10-CM | POA: Diagnosis not present

## 2023-12-30 DIAGNOSIS — R0902 Hypoxemia: Secondary | ICD-10-CM | POA: Diagnosis not present

## 2023-12-30 DIAGNOSIS — E119 Type 2 diabetes mellitus without complications: Secondary | ICD-10-CM | POA: Diagnosis not present

## 2023-12-30 DIAGNOSIS — J690 Pneumonitis due to inhalation of food and vomit: Secondary | ICD-10-CM | POA: Diagnosis not present

## 2023-12-30 DIAGNOSIS — J90 Pleural effusion, not elsewhere classified: Secondary | ICD-10-CM | POA: Diagnosis not present

## 2023-12-31 DIAGNOSIS — Z4659 Encounter for fitting and adjustment of other gastrointestinal appliance and device: Secondary | ICD-10-CM | POA: Diagnosis not present

## 2024-01-03 DIAGNOSIS — Z9049 Acquired absence of other specified parts of digestive tract: Secondary | ICD-10-CM | POA: Diagnosis not present

## 2024-01-04 DIAGNOSIS — K8050 Calculus of bile duct without cholangitis or cholecystitis without obstruction: Secondary | ICD-10-CM | POA: Diagnosis not present

## 2024-01-05 DIAGNOSIS — Z794 Long term (current) use of insulin: Secondary | ICD-10-CM | POA: Diagnosis not present

## 2024-01-05 DIAGNOSIS — R0902 Hypoxemia: Secondary | ICD-10-CM | POA: Diagnosis not present

## 2024-01-05 DIAGNOSIS — I4891 Unspecified atrial fibrillation: Secondary | ICD-10-CM | POA: Diagnosis not present

## 2024-01-05 DIAGNOSIS — E119 Type 2 diabetes mellitus without complications: Secondary | ICD-10-CM | POA: Diagnosis not present

## 2024-01-06 DIAGNOSIS — R0902 Hypoxemia: Secondary | ICD-10-CM | POA: Diagnosis not present

## 2024-01-06 DIAGNOSIS — I4891 Unspecified atrial fibrillation: Secondary | ICD-10-CM | POA: Diagnosis not present

## 2024-01-06 DIAGNOSIS — E119 Type 2 diabetes mellitus without complications: Secondary | ICD-10-CM | POA: Diagnosis not present

## 2024-01-06 DIAGNOSIS — Z794 Long term (current) use of insulin: Secondary | ICD-10-CM | POA: Diagnosis not present

## 2024-01-09 DIAGNOSIS — I2510 Atherosclerotic heart disease of native coronary artery without angina pectoris: Secondary | ICD-10-CM | POA: Diagnosis not present

## 2024-01-09 DIAGNOSIS — E119 Type 2 diabetes mellitus without complications: Secondary | ICD-10-CM | POA: Diagnosis not present

## 2024-01-09 DIAGNOSIS — K8050 Calculus of bile duct without cholangitis or cholecystitis without obstruction: Secondary | ICD-10-CM | POA: Diagnosis not present

## 2024-01-09 DIAGNOSIS — Z951 Presence of aortocoronary bypass graft: Secondary | ICD-10-CM | POA: Diagnosis not present

## 2024-01-09 DIAGNOSIS — E782 Mixed hyperlipidemia: Secondary | ICD-10-CM | POA: Diagnosis not present

## 2024-01-09 NOTE — Discharge Summary (Signed)
 Surgery Discharge Summary  Admit date: 12/26/2023 Admitting Physician: Corlis Monks, MD Admission Diagnoses:  Chronic cholecystitis with gallbladder-duodenal fistula Discharge date and time: Mon 01/09/2024 Discharge Physician: Corlis Monks, MD Discharge Diagnoses: Gallbladder-duodenal fistula, Gallbladder-colonic fistula, Mirizzi syndrome   Problem List[1]  Admission Condition: good Discharged Condition: good  Indication for Admission from Operative Note: Terry White is a 83 y.o. male with a history of chronic cholecystitis with gallbladder-duodenal fistula.  There was no evidence of metastatic disease on preoperative imaging. With these findings, I recommended proceeding with a pylorus preserving Whipple procedure. Understanding the risk and benefits of this procedure, the patient requested to go forward.  Hospital Course: The patient was taken the operating room on 12/26/2023 where he underwent a diagnostic laparoscopy followed by exploratory laparotomy lysis of adhesions with en bloc cholecystectomy involving primary colon repair and partial duodenectomy with creation of surgical gastrojejunostomy.  Patient was recovered and transferred to our standard surgical unit where he was managed with a nasogastric tube which was removed on postop day two.  The patient had an aspiration event overnight with distress and hypoxia.  He was transferred to the surgical ICU where his nasogastric tube was replaced.  The patient was placed on antibiotics for an aspiration event with presumed pneumonia.  The patient was initiated on TPN.  We obtained an upper GI study on postop day 7, which noted contrast readily passing through the gastrojejunostomy into the distal small bowel though he did have small-volume retention of contrast in stomach. The nasogastric tube was removed following day.  Patient was started on clear liquids and was advanced slowly over the next several days to a regular diet.   Patient continued with a small oxygen requirement.  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  The patient had persistent hypoxia on room air with ambulatory pulse ox dropping into the mid 80s though he was largely asymptomatic.  We have set the patient up with home oxygen.  He is presently postop day 14 his surgical JP drain remains serosanguineous and was removed.  He is ambulatory with the assistance of a walker though independent with transfer his pain is well-controlled requiring very little pain medication.  His incision is healing well and on oxygen his vital signs are stable.  Patient has been cleared for discharge we will see him back in approximately 2 weeks for postoperative follow-up.  Of note we have resumed patient's Plavix  and aspirin  and all his home medications.  We have deferred Lovenox  for DVT prophylaxis.   Procedures/Surgeries performed during hospitalization: No surgery found DIAGNOSTIC LAPAROSCOPY COVERTED TO DIAGNOSTIC LAPAROTOMY, CHOLECYSTECTOMY WITH EXCISION OF COLON, PARTS OF DUODENUM AND GASTROJEJUNUM:    Discharge Exam: Vitals:   01/09/24 1139  BP: (!) 121/54  Pulse: 71  Resp: 17  Temp: 98.1 F (36.7 C)  SpO2: 99%   General: Alert, oriented X 4, no apparent distress.  Head-Eyes-Ears-Nose-Throat: EOMI, pupils equal, round and reactive to light,  oropharynx clear, no LAD.  Cardiovascular: Regular rate and rhythm, s1, s2, no murmurs.  Chest/Lungs: Clear to auscultation bilaterally, no wheezes or rhonchi.  Abdomen: Positive bowel sounds. Abdomen soft, nondistended, nontender. No HSM, rebound or guarding.  Extremities: 2+ peripheral pulses  Skin: Incision healing well  Other:     Disposition: home  Patient Instructions:  Call office or seek medical care if:  * You develop a fever greater than 101.5 * Have any shaking chills or are feeling ill. *  If you have any increasing abdominal pain * Persistent  nausea or vomiting * Inability to hold down any food or fluids * If you have any leaking or drainage from your wound * Any redness or pain at you incision * If your wound opens up or separates * Seek immediate reevaluation  If any questions you may call [phone] for all assistance or call [phone] for urgent assistance after hours (5:00 PM to 8:00 AM) or on weekends.   Discharge Medications: Home Medications After Discharge  Scheduled . allopurinoL  (ZYLOPRIM ) 300 mg tablet, Take 300 mg by mouth daily. . amiodarone  (PACERONE ) 200 mg tablet, Take 1 tablet (200 mg total) by mouth daily. Start: 01/10/24 . aspirin  81 mg chewable tablet, Chew 81 mg daily. . atorvastatin  (LIPITOR) 80 mg tablet, Take 80 mg by mouth daily. . cetirizine (ZyrTEC) 10 mg tablet, Take 10 mg by mouth daily. . clopidogreL  (PLAVIX ) 75 mg tablet, Take 75 mg by mouth daily. . Jardiance  10 mg tab, Take 10 mg by mouth daily. . magnesium  oxide 400 mg magnesium  cap, Take 400 mg by mouth daily. . metFORMIN  (GLUCOPHAGE -XR) 500 mg 24 hr tablet, Take 500 mg by mouth daily with breakfast. . metoprolol  succinate (TOPROL  XL) 25 mg 24 hr tablet, Take 25 mg by mouth daily. . multivit-minerals/FA/lycopene (ONE-A-DAY MEN'S 50 PLUS ORAL), Take 1 tablet by mouth daily. SABRA omeprazole (PriLOSEC) 20 mg DR capsule, Take 1 capsule (20 mg total) by mouth in the morning. Start: 01/10/24  PRN . hydrocortisone 2.5 % cream, Apply topically daily as needed (itching). . nitroglycerin  (NITROSTAT ) 0.4 mg SL tablet, Place 0.4 mg under the tongue every 5 (five) minutes as needed for chest pain. . traMADoL  (ULTRAM ) 50 mg tablet, Take 1 tablet (50 mg total) by mouth every 6 (six) hours as needed for moderate pain (4-6).  Follow-up Appointments No future appointments.   Time spent on discharge: 30 minutes       [1] Patient Active Problem List Diagnosis  . Cholecystoduodenal fistula  . Fistula  . Cholecystitis  . History of coronary  artery bypass surgery  . CAD (coronary artery disease)  . Type 2 diabetes mellitus  . Choledocholithiasis  . Other emphysema

## 2024-01-10 ENCOUNTER — Telehealth: Payer: Self-pay

## 2024-01-10 DIAGNOSIS — Z48815 Encounter for surgical aftercare following surgery on the digestive system: Secondary | ICD-10-CM | POA: Diagnosis not present

## 2024-01-10 DIAGNOSIS — I2510 Atherosclerotic heart disease of native coronary artery without angina pectoris: Secondary | ICD-10-CM | POA: Diagnosis not present

## 2024-01-10 DIAGNOSIS — E785 Hyperlipidemia, unspecified: Secondary | ICD-10-CM | POA: Diagnosis not present

## 2024-01-10 DIAGNOSIS — E119 Type 2 diabetes mellitus without complications: Secondary | ICD-10-CM | POA: Diagnosis not present

## 2024-01-10 DIAGNOSIS — I5030 Unspecified diastolic (congestive) heart failure: Secondary | ICD-10-CM | POA: Diagnosis not present

## 2024-01-10 DIAGNOSIS — Z431 Encounter for attention to gastrostomy: Secondary | ICD-10-CM | POA: Diagnosis not present

## 2024-01-10 DIAGNOSIS — J438 Other emphysema: Secondary | ICD-10-CM | POA: Diagnosis not present

## 2024-01-10 DIAGNOSIS — R32 Unspecified urinary incontinence: Secondary | ICD-10-CM | POA: Diagnosis not present

## 2024-01-10 DIAGNOSIS — I110 Hypertensive heart disease with heart failure: Secondary | ICD-10-CM | POA: Diagnosis not present

## 2024-01-10 NOTE — Transitions of Care (Post Inpatient/ED Visit) (Signed)
   01/10/2024  Name: Terry White MRN: [id number] DOB: June 17, 1940  Today's TOC FU Call Status: Today's TOC FU Call Status:: Unsuccessful Call (1st Attempt) Unsuccessful Call (1st Attempt) Date: 01/10/24  Attempted to reach the patient regarding the most recent Inpatient/ED visit.  Follow Up Plan: Additional outreach attempts will be made to reach the patient to complete the Transitions of Care (Post Inpatient/ED visit) call.   Alan Ee, RN, BSN, CEN [employer]- Transition of Care Team.  [facility] [phone]

## 2024-01-11 ENCOUNTER — Telehealth: Payer: Self-pay | Admitting: *Deleted

## 2024-01-11 NOTE — Transitions of Care (Post Inpatient/ED Visit) (Signed)
   01/11/2024  Name: Terry White MRN: [id number] DOB: 1940-08-05  Today's TOC FU Call Status: Today's TOC FU Call Status:: Unsuccessful Call (2nd Attempt) Unsuccessful Call (2nd Attempt) Date: 01/11/24  Attempted to reach the patient regarding the most recent Inpatient/ED visit.  Follow Up Plan: Additional outreach attempts will be made to reach the patient to complete the Transitions of Care (Post Inpatient/ED visit) call.   Cathlean Headland BSN RN [facility] Health Care Management Coordinator [email or web address] Direct Dial: [phone]  Fax: [phone] Website: [email or web address]

## 2024-01-12 ENCOUNTER — Telehealth: Payer: Self-pay

## 2024-01-12 NOTE — Transitions of Care (Post Inpatient/ED Visit) (Signed)
   01/12/2024  Name: Terry White MRN: [id number] DOB: 10-28-1940  Today's TOC FU Call Status: Today's TOC FU Call Status:: Unsuccessful Call (3rd Attempt) Unsuccessful Call (3rd Attempt) Date: 01/12/24  Attempted to reach the patient regarding the most recent Inpatient/ED visit.  Follow Up Plan: No further outreach attempts will be made at this time. We have been unable to contact the patient.  Alan Ee, RN, BSN, CEN [employer]- Transition of Care Team.  [facility] [phone]

## 2024-01-30 DIAGNOSIS — I10 Essential (primary) hypertension: Secondary | ICD-10-CM | POA: Diagnosis not present

## 2024-01-30 DIAGNOSIS — I2510 Atherosclerotic heart disease of native coronary artery without angina pectoris: Secondary | ICD-10-CM | POA: Diagnosis not present

## 2024-01-30 DIAGNOSIS — Z9049 Acquired absence of other specified parts of digestive tract: Secondary | ICD-10-CM | POA: Diagnosis not present

## 2024-01-30 DIAGNOSIS — E1121 Type 2 diabetes mellitus with diabetic nephropathy: Secondary | ICD-10-CM | POA: Diagnosis not present

## 2024-01-30 DIAGNOSIS — M109 Gout, unspecified: Secondary | ICD-10-CM | POA: Diagnosis not present

## 2024-01-30 DIAGNOSIS — Z6820 Body mass index (BMI) 20.0-20.9, adult: Secondary | ICD-10-CM | POA: Diagnosis not present

## 2024-02-09 DIAGNOSIS — E785 Hyperlipidemia, unspecified: Secondary | ICD-10-CM | POA: Diagnosis not present

## 2024-02-09 DIAGNOSIS — J438 Other emphysema: Secondary | ICD-10-CM | POA: Diagnosis not present

## 2024-02-09 DIAGNOSIS — Z431 Encounter for attention to gastrostomy: Secondary | ICD-10-CM | POA: Diagnosis not present

## 2024-02-09 DIAGNOSIS — Z48815 Encounter for surgical aftercare following surgery on the digestive system: Secondary | ICD-10-CM | POA: Diagnosis not present

## 2024-02-09 DIAGNOSIS — E119 Type 2 diabetes mellitus without complications: Secondary | ICD-10-CM | POA: Diagnosis not present

## 2024-02-09 DIAGNOSIS — R32 Unspecified urinary incontinence: Secondary | ICD-10-CM | POA: Diagnosis not present

## 2024-02-09 DIAGNOSIS — I5030 Unspecified diastolic (congestive) heart failure: Secondary | ICD-10-CM | POA: Diagnosis not present

## 2024-02-09 DIAGNOSIS — I110 Hypertensive heart disease with heart failure: Secondary | ICD-10-CM | POA: Diagnosis not present

## 2024-02-09 DIAGNOSIS — I2510 Atherosclerotic heart disease of native coronary artery without angina pectoris: Secondary | ICD-10-CM | POA: Diagnosis not present

## 2024-02-15 DIAGNOSIS — I110 Hypertensive heart disease with heart failure: Secondary | ICD-10-CM | POA: Diagnosis not present

## 2024-02-15 DIAGNOSIS — Z431 Encounter for attention to gastrostomy: Secondary | ICD-10-CM | POA: Diagnosis not present

## 2024-02-15 DIAGNOSIS — I5030 Unspecified diastolic (congestive) heart failure: Secondary | ICD-10-CM | POA: Diagnosis not present

## 2024-02-15 DIAGNOSIS — Z48815 Encounter for surgical aftercare following surgery on the digestive system: Secondary | ICD-10-CM | POA: Diagnosis not present

## 2024-02-15 DIAGNOSIS — E119 Type 2 diabetes mellitus without complications: Secondary | ICD-10-CM | POA: Diagnosis not present

## 2024-02-15 DIAGNOSIS — E785 Hyperlipidemia, unspecified: Secondary | ICD-10-CM | POA: Diagnosis not present

## 2024-02-15 DIAGNOSIS — I2510 Atherosclerotic heart disease of native coronary artery without angina pectoris: Secondary | ICD-10-CM | POA: Diagnosis not present

## 2024-02-15 DIAGNOSIS — J438 Other emphysema: Secondary | ICD-10-CM | POA: Diagnosis not present

## 2024-02-15 DIAGNOSIS — R32 Unspecified urinary incontinence: Secondary | ICD-10-CM | POA: Diagnosis not present

## 2024-02-23 DIAGNOSIS — E1121 Type 2 diabetes mellitus with diabetic nephropathy: Secondary | ICD-10-CM | POA: Diagnosis not present

## 2024-02-23 DIAGNOSIS — Z9049 Acquired absence of other specified parts of digestive tract: Secondary | ICD-10-CM | POA: Diagnosis not present

## 2024-02-23 DIAGNOSIS — I2510 Atherosclerotic heart disease of native coronary artery without angina pectoris: Secondary | ICD-10-CM | POA: Diagnosis not present

## 2024-02-23 DIAGNOSIS — M109 Gout, unspecified: Secondary | ICD-10-CM | POA: Diagnosis not present

## 2024-02-23 DIAGNOSIS — Z6821 Body mass index (BMI) 21.0-21.9, adult: Secondary | ICD-10-CM | POA: Diagnosis not present

## 2024-02-23 DIAGNOSIS — I10 Essential (primary) hypertension: Secondary | ICD-10-CM | POA: Diagnosis not present

## 2024-02-26 DIAGNOSIS — I5030 Unspecified diastolic (congestive) heart failure: Secondary | ICD-10-CM | POA: Diagnosis not present

## 2024-02-26 DIAGNOSIS — I110 Hypertensive heart disease with heart failure: Secondary | ICD-10-CM | POA: Diagnosis not present

## 2024-02-26 DIAGNOSIS — Z48815 Encounter for surgical aftercare following surgery on the digestive system: Secondary | ICD-10-CM | POA: Diagnosis not present

## 2024-02-26 DIAGNOSIS — Z431 Encounter for attention to gastrostomy: Secondary | ICD-10-CM | POA: Diagnosis not present

## 2024-02-26 DIAGNOSIS — R32 Unspecified urinary incontinence: Secondary | ICD-10-CM | POA: Diagnosis not present

## 2024-02-26 DIAGNOSIS — I2510 Atherosclerotic heart disease of native coronary artery without angina pectoris: Secondary | ICD-10-CM | POA: Diagnosis not present

## 2024-02-26 DIAGNOSIS — E119 Type 2 diabetes mellitus without complications: Secondary | ICD-10-CM | POA: Diagnosis not present

## 2024-02-26 DIAGNOSIS — E785 Hyperlipidemia, unspecified: Secondary | ICD-10-CM | POA: Diagnosis not present

## 2024-03-06 DIAGNOSIS — I5030 Unspecified diastolic (congestive) heart failure: Secondary | ICD-10-CM | POA: Diagnosis not present

## 2024-04-27 ENCOUNTER — Other Ambulatory Visit: Payer: Self-pay | Admitting: Nurse Practitioner

## 2024-05-25 ENCOUNTER — Ambulatory Visit: Admitting: Nurse Practitioner
